# Patient Record
Sex: Female | Born: 1945
Health system: Southern US, Community
[De-identification: ages and names within clinical notes are randomized; demographics above are authoritative.]

## PROBLEM LIST (undated history)

## (undated) DIAGNOSIS — M1990 Unspecified osteoarthritis, unspecified site: Secondary | ICD-10-CM

## (undated) DIAGNOSIS — I2510 Atherosclerotic heart disease of native coronary artery without angina pectoris: Secondary | ICD-10-CM

## (undated) DIAGNOSIS — I10 Essential (primary) hypertension: Secondary | ICD-10-CM

## (undated) DIAGNOSIS — T8859XA Other complications of anesthesia, initial encounter: Secondary | ICD-10-CM

## (undated) DIAGNOSIS — E785 Hyperlipidemia, unspecified: Secondary | ICD-10-CM

## (undated) DIAGNOSIS — G4700 Insomnia, unspecified: Secondary | ICD-10-CM

## (undated) DIAGNOSIS — G4730 Sleep apnea, unspecified: Secondary | ICD-10-CM

## (undated) HISTORY — DX: Essential (primary) hypertension: I10

## (undated) HISTORY — DX: Hyperlipidemia, unspecified: E78.5

## (undated) HISTORY — PX: COLONOSCOPY: SHX174

## (undated) HISTORY — DX: Insomnia, unspecified: G47.00

## (undated) HISTORY — PX: CATARACT EXTRACTION, BILATERAL: SHX1313

---

## 1967-01-19 HISTORY — PX: TUBAL LIGATION: SHX77

## 1997-08-15 ENCOUNTER — Other Ambulatory Visit: Admission: RE | Admit: 1997-08-15 | Discharge: 1997-08-15 | Payer: Self-pay | Admitting: *Deleted

## 1997-08-20 ENCOUNTER — Ambulatory Visit (HOSPITAL_COMMUNITY): Admission: RE | Admit: 1997-08-20 | Discharge: 1997-08-20 | Payer: Self-pay | Admitting: *Deleted

## 1998-10-09 ENCOUNTER — Other Ambulatory Visit: Admission: RE | Admit: 1998-10-09 | Discharge: 1998-10-09 | Payer: Self-pay | Admitting: *Deleted

## 1998-10-21 ENCOUNTER — Encounter: Payer: Self-pay | Admitting: *Deleted

## 1998-10-21 ENCOUNTER — Ambulatory Visit (HOSPITAL_COMMUNITY): Admission: RE | Admit: 1998-10-21 | Discharge: 1998-10-21 | Payer: Self-pay | Admitting: *Deleted

## 2001-02-28 ENCOUNTER — Ambulatory Visit (HOSPITAL_COMMUNITY): Admission: RE | Admit: 2001-02-28 | Discharge: 2001-02-28 | Payer: Self-pay | Admitting: Obstetrics and Gynecology

## 2001-02-28 ENCOUNTER — Encounter: Payer: Self-pay | Admitting: Obstetrics and Gynecology

## 2001-11-22 ENCOUNTER — Ambulatory Visit (HOSPITAL_COMMUNITY): Admission: RE | Admit: 2001-11-22 | Discharge: 2001-11-22 | Payer: Self-pay | Admitting: Gastroenterology

## 2002-08-29 ENCOUNTER — Ambulatory Visit (HOSPITAL_COMMUNITY): Admission: RE | Admit: 2002-08-29 | Discharge: 2002-08-29 | Payer: Self-pay | Admitting: Obstetrics and Gynecology

## 2002-08-29 ENCOUNTER — Encounter: Payer: Self-pay | Admitting: Obstetrics and Gynecology

## 2003-08-23 ENCOUNTER — Emergency Department (HOSPITAL_COMMUNITY): Admission: EM | Admit: 2003-08-23 | Discharge: 2003-08-23 | Payer: Self-pay | Admitting: Family Medicine

## 2003-10-08 ENCOUNTER — Ambulatory Visit (HOSPITAL_COMMUNITY): Admission: RE | Admit: 2003-10-08 | Discharge: 2003-10-08 | Payer: Self-pay | Admitting: Obstetrics and Gynecology

## 2003-12-04 ENCOUNTER — Encounter: Admission: RE | Admit: 2003-12-04 | Discharge: 2003-12-04 | Payer: Self-pay | Admitting: Internal Medicine

## 2004-10-20 ENCOUNTER — Ambulatory Visit (HOSPITAL_COMMUNITY): Admission: RE | Admit: 2004-10-20 | Discharge: 2004-10-20 | Payer: Self-pay | Admitting: Obstetrics and Gynecology

## 2005-10-26 ENCOUNTER — Ambulatory Visit (HOSPITAL_COMMUNITY): Admission: RE | Admit: 2005-10-26 | Discharge: 2005-10-26 | Payer: Self-pay | Admitting: Obstetrics and Gynecology

## 2008-11-19 DIAGNOSIS — E785 Hyperlipidemia, unspecified: Secondary | ICD-10-CM | POA: Insufficient documentation

## 2008-12-03 ENCOUNTER — Ambulatory Visit (HOSPITAL_COMMUNITY): Admission: RE | Admit: 2008-12-03 | Discharge: 2008-12-03 | Payer: Self-pay | Admitting: Internal Medicine

## 2009-12-02 DIAGNOSIS — Z2839 Other underimmunization status: Secondary | ICD-10-CM | POA: Insufficient documentation

## 2009-12-02 DIAGNOSIS — M810 Age-related osteoporosis without current pathological fracture: Secondary | ICD-10-CM | POA: Insufficient documentation

## 2009-12-19 ENCOUNTER — Ambulatory Visit (HOSPITAL_COMMUNITY)
Admission: RE | Admit: 2009-12-19 | Discharge: 2009-12-19 | Payer: Self-pay | Source: Home / Self Care | Admitting: Internal Medicine

## 2010-06-05 NOTE — Op Note (Signed)
   NAMEVIVIKA, Helen Washington NO.:  [id number]   MEDICAL RECORD NO.:  [id number]                   PATIENT TYPE:  AMB   LOCATION:  ENDO                                 FACILITY:  [facility]   PHYSICIAN:  Petra Kuba, M.D.                 DATE OF BIRTH:  October 22, 1945   DATE OF PROCEDURE:  11/22/2001  DATE OF DISCHARGE:                                 OPERATIVE REPORT   PROCEDURE:  Colonoscopy.   INDICATIONS:  Family history of colon cancer.  Consent was signed after  risks, benefits, methods, and options thoroughly discussed in the office.   MEDICATIONS:  Demerol 50 mg, Versed 4 mg.   DESCRIPTION OF PROCEDURE:  Rectal inspection was pertinent for external  hemorrhoids, small.  Digital exam was negative.  The video pediatric  adjustable colonoscope was inserted, easily advanced around the colon to the  cecum.  This did require some abdominal pressure but no position changes.  No obvious abnormality was seen on insertion.  The cecum was identified by  the appendiceal orifice and the ileocecal valve.  In fact, the scope was  inserted a short way into the terminal ileum, which was normal.  Photo  documentation was obtained.  The scope was slowly withdrawn.  On slow  withdrawal through the colon, a rare right and left diverticula were seen  but no polypoid lesions, masses, or other abnormalities as we slowly  withdrew back to the rectum.  Once back in the rectum, the scope was  retroflexed, pertinent for some internal hemorrhoids.  The scope was  straightened and readvanced a short way up the left side of the colon, air  was suctioned, and the scope removed.  The patient tolerated the procedure  well.  There was no obvious immediate complication.   ENDOSCOPIC DIAGNOSES:  1 . Internal-external hemorrhoids.  1. Occasional left and right diverticula.  2. Otherwise within normal limits to the terminal ileum.   PLAN:  Happy to see back p.r.n.  Otherwise, return care  to Dr. Felipa Eth for the  customary health care maintenance to include yearly rectals and guaiacs, and  otherwise probably repeat screening in five years.                                               Petra Kuba, M.D.    MEM/MEDQ  D:  11/22/2001  T:  11/23/2001  Job:  [id number]   cc:   Larina Earthly, M.D.  [address]  [place]  [place] [address]  Fax: [phone]

## 2011-01-07 ENCOUNTER — Other Ambulatory Visit (HOSPITAL_COMMUNITY): Payer: Self-pay | Admitting: Internal Medicine

## 2011-01-07 DIAGNOSIS — Z1231 Encounter for screening mammogram for malignant neoplasm of breast: Secondary | ICD-10-CM

## 2011-02-01 ENCOUNTER — Ambulatory Visit (HOSPITAL_COMMUNITY)
Admission: RE | Admit: 2011-02-01 | Discharge: 2011-02-01 | Disposition: A | Discharge status: Home / Self Care | Payer: Medicare Other | Source: Ambulatory Visit | Attending: Internal Medicine | Admitting: Internal Medicine

## 2011-02-01 DIAGNOSIS — Z1231 Encounter for screening mammogram for malignant neoplasm of breast: Secondary | ICD-10-CM

## 2012-03-06 ENCOUNTER — Other Ambulatory Visit (HOSPITAL_COMMUNITY): Payer: Self-pay | Admitting: Internal Medicine

## 2012-03-06 DIAGNOSIS — Z1231 Encounter for screening mammogram for malignant neoplasm of breast: Secondary | ICD-10-CM

## 2012-03-21 ENCOUNTER — Ambulatory Visit (HOSPITAL_COMMUNITY): Payer: Medicare Other

## 2012-03-30 ENCOUNTER — Ambulatory Visit (HOSPITAL_COMMUNITY)
Admission: RE | Admit: 2012-03-30 | Discharge: 2012-03-30 | Disposition: A | Discharge status: Home / Self Care | Payer: Medicare Other | Source: Ambulatory Visit | Attending: Internal Medicine | Admitting: Internal Medicine

## 2012-03-30 DIAGNOSIS — Z1231 Encounter for screening mammogram for malignant neoplasm of breast: Secondary | ICD-10-CM | POA: Insufficient documentation

## 2012-12-26 DIAGNOSIS — K5900 Constipation, unspecified: Secondary | ICD-10-CM | POA: Insufficient documentation

## 2013-09-26 ENCOUNTER — Other Ambulatory Visit (HOSPITAL_COMMUNITY): Payer: Self-pay | Admitting: Internal Medicine

## 2013-09-26 DIAGNOSIS — Z1231 Encounter for screening mammogram for malignant neoplasm of breast: Secondary | ICD-10-CM

## 2013-09-27 ENCOUNTER — Ambulatory Visit (HOSPITAL_COMMUNITY)
Admission: RE | Admit: 2013-09-27 | Discharge: 2013-09-27 | Disposition: A | Discharge status: Home / Self Care | Payer: Medicare HMO | Source: Ambulatory Visit | Attending: Internal Medicine | Admitting: Internal Medicine

## 2013-09-27 DIAGNOSIS — Z1231 Encounter for screening mammogram for malignant neoplasm of breast: Secondary | ICD-10-CM | POA: Diagnosis present

## 2014-10-14 ENCOUNTER — Other Ambulatory Visit (HOSPITAL_BASED_OUTPATIENT_CLINIC_OR_DEPARTMENT_OTHER): Payer: Self-pay

## 2014-10-14 ENCOUNTER — Other Ambulatory Visit (HOSPITAL_COMMUNITY): Payer: Self-pay | Admitting: *Deleted

## 2014-10-14 DIAGNOSIS — Z1231 Encounter for screening mammogram for malignant neoplasm of breast: Secondary | ICD-10-CM

## 2014-10-18 ENCOUNTER — Ambulatory Visit (HOSPITAL_COMMUNITY)
Admission: RE | Admit: 2014-10-18 | Discharge: 2014-10-18 | Disposition: A | Discharge status: Home / Self Care | Payer: Medicare HMO | Source: Ambulatory Visit | Attending: Internal Medicine | Admitting: Internal Medicine

## 2014-10-18 DIAGNOSIS — Z1231 Encounter for screening mammogram for malignant neoplasm of breast: Secondary | ICD-10-CM | POA: Diagnosis not present

## 2014-10-22 DIAGNOSIS — H2513 Age-related nuclear cataract, bilateral: Secondary | ICD-10-CM | POA: Diagnosis not present

## 2014-10-22 DIAGNOSIS — H40013 Open angle with borderline findings, low risk, bilateral: Secondary | ICD-10-CM | POA: Diagnosis not present

## 2015-01-30 DIAGNOSIS — R69 Illness, unspecified: Secondary | ICD-10-CM | POA: Diagnosis not present

## 2015-02-04 DIAGNOSIS — Z0000 Encounter for general adult medical examination without abnormal findings: Secondary | ICD-10-CM | POA: Diagnosis not present

## 2015-02-04 DIAGNOSIS — M810 Age-related osteoporosis without current pathological fracture: Secondary | ICD-10-CM | POA: Diagnosis not present

## 2015-02-04 DIAGNOSIS — E785 Hyperlipidemia, unspecified: Secondary | ICD-10-CM | POA: Diagnosis not present

## 2015-02-04 DIAGNOSIS — E559 Vitamin D deficiency, unspecified: Secondary | ICD-10-CM | POA: Diagnosis not present

## 2015-02-11 DIAGNOSIS — N390 Urinary tract infection, site not specified: Secondary | ICD-10-CM | POA: Insufficient documentation

## 2015-02-11 DIAGNOSIS — E559 Vitamin D deficiency, unspecified: Secondary | ICD-10-CM | POA: Diagnosis not present

## 2015-02-11 DIAGNOSIS — M810 Age-related osteoporosis without current pathological fracture: Secondary | ICD-10-CM | POA: Diagnosis not present

## 2015-02-11 DIAGNOSIS — G4709 Other insomnia: Secondary | ICD-10-CM | POA: Diagnosis not present

## 2015-02-11 DIAGNOSIS — Z23 Encounter for immunization: Secondary | ICD-10-CM | POA: Diagnosis not present

## 2015-02-11 DIAGNOSIS — Z1389 Encounter for screening for other disorder: Secondary | ICD-10-CM | POA: Diagnosis not present

## 2015-02-11 DIAGNOSIS — E784 Other hyperlipidemia: Secondary | ICD-10-CM | POA: Diagnosis not present

## 2015-02-11 DIAGNOSIS — Z0000 Encounter for general adult medical examination without abnormal findings: Secondary | ICD-10-CM | POA: Diagnosis not present

## 2015-02-11 DIAGNOSIS — D239 Other benign neoplasm of skin, unspecified: Secondary | ICD-10-CM | POA: Diagnosis not present

## 2015-02-11 DIAGNOSIS — Z0131 Encounter for examination of blood pressure with abnormal findings: Secondary | ICD-10-CM | POA: Diagnosis not present

## 2015-03-25 DIAGNOSIS — Z0131 Encounter for examination of blood pressure with abnormal findings: Secondary | ICD-10-CM | POA: Diagnosis not present

## 2015-03-25 DIAGNOSIS — R0789 Other chest pain: Secondary | ICD-10-CM | POA: Diagnosis not present

## 2015-03-25 DIAGNOSIS — Z6823 Body mass index (BMI) 23.0-23.9, adult: Secondary | ICD-10-CM | POA: Diagnosis not present

## 2015-04-22 DIAGNOSIS — H2513 Age-related nuclear cataract, bilateral: Secondary | ICD-10-CM | POA: Diagnosis not present

## 2015-05-07 DIAGNOSIS — M810 Age-related osteoporosis without current pathological fracture: Secondary | ICD-10-CM | POA: Diagnosis not present

## 2015-10-09 ENCOUNTER — Other Ambulatory Visit: Payer: Self-pay | Admitting: Internal Medicine

## 2015-10-09 DIAGNOSIS — Z1231 Encounter for screening mammogram for malignant neoplasm of breast: Secondary | ICD-10-CM

## 2015-10-21 ENCOUNTER — Ambulatory Visit
Admission: RE | Admit: 2015-10-21 | Discharge: 2015-10-21 | Disposition: A | Discharge status: Home / Self Care | Payer: Medicare HMO | Source: Ambulatory Visit | Attending: Internal Medicine | Admitting: Internal Medicine

## 2015-10-21 DIAGNOSIS — Z1231 Encounter for screening mammogram for malignant neoplasm of breast: Secondary | ICD-10-CM | POA: Diagnosis not present

## 2015-10-22 DIAGNOSIS — H2513 Age-related nuclear cataract, bilateral: Secondary | ICD-10-CM | POA: Diagnosis not present

## 2015-12-10 DIAGNOSIS — H401311 Pigmentary glaucoma, right eye, mild stage: Secondary | ICD-10-CM | POA: Diagnosis not present

## 2015-12-23 DIAGNOSIS — H25812 Combined forms of age-related cataract, left eye: Secondary | ICD-10-CM | POA: Diagnosis not present

## 2015-12-23 DIAGNOSIS — H25012 Cortical age-related cataract, left eye: Secondary | ICD-10-CM | POA: Diagnosis not present

## 2015-12-23 DIAGNOSIS — H2512 Age-related nuclear cataract, left eye: Secondary | ICD-10-CM | POA: Diagnosis not present

## 2016-01-20 DIAGNOSIS — H2511 Age-related nuclear cataract, right eye: Secondary | ICD-10-CM | POA: Diagnosis not present

## 2016-01-20 DIAGNOSIS — H25011 Cortical age-related cataract, right eye: Secondary | ICD-10-CM | POA: Diagnosis not present

## 2016-01-20 DIAGNOSIS — H25811 Combined forms of age-related cataract, right eye: Secondary | ICD-10-CM | POA: Diagnosis not present

## 2016-01-27 DIAGNOSIS — R69 Illness, unspecified: Secondary | ICD-10-CM | POA: Diagnosis not present

## 2016-02-10 DIAGNOSIS — M810 Age-related osteoporosis without current pathological fracture: Secondary | ICD-10-CM | POA: Diagnosis not present

## 2016-02-10 DIAGNOSIS — E784 Other hyperlipidemia: Secondary | ICD-10-CM | POA: Diagnosis not present

## 2016-02-10 DIAGNOSIS — R8299 Other abnormal findings in urine: Secondary | ICD-10-CM | POA: Diagnosis not present

## 2016-02-17 DIAGNOSIS — Z0000 Encounter for general adult medical examination without abnormal findings: Secondary | ICD-10-CM | POA: Diagnosis not present

## 2016-02-17 DIAGNOSIS — Z23 Encounter for immunization: Secondary | ICD-10-CM | POA: Diagnosis not present

## 2016-02-17 DIAGNOSIS — Z0131 Encounter for examination of blood pressure with abnormal findings: Secondary | ICD-10-CM | POA: Diagnosis not present

## 2016-02-17 DIAGNOSIS — R0789 Other chest pain: Secondary | ICD-10-CM | POA: Diagnosis not present

## 2016-02-17 DIAGNOSIS — E784 Other hyperlipidemia: Secondary | ICD-10-CM | POA: Diagnosis not present

## 2016-02-17 DIAGNOSIS — Z6822 Body mass index (BMI) 22.0-22.9, adult: Secondary | ICD-10-CM | POA: Diagnosis not present

## 2016-02-17 DIAGNOSIS — M810 Age-related osteoporosis without current pathological fracture: Secondary | ICD-10-CM | POA: Diagnosis not present

## 2016-02-17 DIAGNOSIS — G4700 Insomnia, unspecified: Secondary | ICD-10-CM | POA: Diagnosis not present

## 2016-02-17 DIAGNOSIS — K5900 Constipation, unspecified: Secondary | ICD-10-CM | POA: Diagnosis not present

## 2016-02-17 DIAGNOSIS — Z1389 Encounter for screening for other disorder: Secondary | ICD-10-CM | POA: Diagnosis not present

## 2016-03-02 DIAGNOSIS — M810 Age-related osteoporosis without current pathological fracture: Secondary | ICD-10-CM | POA: Diagnosis not present

## 2016-03-04 DIAGNOSIS — R030 Elevated blood-pressure reading, without diagnosis of hypertension: Secondary | ICD-10-CM | POA: Insufficient documentation

## 2016-03-04 DIAGNOSIS — Z6823 Body mass index (BMI) 23.0-23.9, adult: Secondary | ICD-10-CM | POA: Diagnosis not present

## 2016-03-09 DIAGNOSIS — Z1212 Encounter for screening for malignant neoplasm of rectum: Secondary | ICD-10-CM | POA: Diagnosis not present

## 2016-03-12 DIAGNOSIS — R030 Elevated blood-pressure reading, without diagnosis of hypertension: Secondary | ICD-10-CM | POA: Diagnosis not present

## 2016-04-01 ENCOUNTER — Encounter: Payer: Self-pay | Admitting: Neurology

## 2016-04-01 ENCOUNTER — Ambulatory Visit (INDEPENDENT_AMBULATORY_CARE_PROVIDER_SITE_OTHER): Payer: Medicare HMO | Admitting: Neurology

## 2016-04-01 VITALS — BP 141/68 | HR 78 | Resp 20 | Ht 60.0 in | Wt 119.0 lb

## 2016-04-01 DIAGNOSIS — R0683 Snoring: Secondary | ICD-10-CM | POA: Insufficient documentation

## 2016-04-01 DIAGNOSIS — R69 Illness, unspecified: Secondary | ICD-10-CM | POA: Diagnosis not present

## 2016-04-01 DIAGNOSIS — F5102 Adjustment insomnia: Secondary | ICD-10-CM | POA: Diagnosis not present

## 2016-04-01 DIAGNOSIS — I10 Essential (primary) hypertension: Secondary | ICD-10-CM | POA: Insufficient documentation

## 2016-04-01 NOTE — Progress Notes (Signed)
SLEEP MEDICINE CLINIC   Provider:  Larey Seat, M D  Referring Provider: Prince Solian, MD Primary Care Physician:  Tivis Ringer, MD  Chief Complaint  Patient presents with  . Insomnia    rm 11, New Pt, referral for sleep study    HPI:  Helen Washington is a 71 y.o. female , seen here as a referral  from Dr. Dagmar Hait for A sleep consultation.  Helen Washington is a 71 year old Caucasian female patient presents today after she has had multiple high blood pressure readings some at night and some in daytime without further explanation. Dr. Dagmar Hait would  like to see if sleep apnea may be a cause.  The patient reports that she sometimes experiences sleeplessness, tossing and turning not being able to enter sleep. But this is an exception.   Chief complaint according to patient : "I have no problems sleeping".     Sleep habits are as follows: She goes to her bedroom between 9 and 10 PM but she watches TV from there on until she feels ready to go to sleep. She set the TV on a timer of 60 minutes duration, and is usually asleep before that time is over. Most of the time she will stay asleep. He does not have nocturia , has no need to go to the bathroom at night. She usually wakes up at 4 AM spontaneously and she again will watch TV at that time until 6 AM when she gets up. After arising at 6 AM she has breakfast at home including coffee, and 3 mornings a week goes to the [facility] for sports. She does 2 hours of water aerobics and the treadmill for 20 minutes. And she may swim for another half hour. She rarely experiences shortness of breath not doing exercises, she usually does not experience palpitations or diaphoresis at night, she does not wake up with headaches, not with a dry mouth. Once in a while  She has trouble sleeping and she may just take a 30 minute nap the following day.     Sleep medical history and family sleep history:  Social history: The patient is a former smoker but quit in 2001  which means she hasn't smoked in over 17 years. She had 30 pack years, she drinks only 1 caffeinated beverage a day, she does not use alcohol, she is very active ( retired) and she swims and walks. [employer], after 40 years. Past medical history is positive for hyperlipidemia, periodic insomnia, a tubal ligation in 1969, cataract surgery in December 2017 and in January of this year, and a family history of cancer in her father and kidney stones, her mother lift through her 22th birthday, suffered a stroke, had hypertension and arthritis. The patient also has 3 brothers one died of natural causes in advanced age one of her brothers committed suicide. One sister is 22 living with hypertension her daughter is alive and well at 74 she has 2 grandchildren.   Review of Systems: Out of a complete 14 system review, the patient complains of only the following symptoms, and all other reviewed systems are negative.  Elevated blood pressures the patient was issued a 24-hour blood pressure cuff but could hardly sleep with a device on, she was instructed on low sodium diet, She has not had symptoms of high blood pressure.  Epworth score 8, Fatigue severity score 12  , depression score not depressed.   Social History   Social History  . Marital  status: Divorced    Spouse name: N/A  . Number of children: 1  . Years of education: 30   Occupational History  .      retired Scientist, clinical (histocompatibility and immunogenetics), Sport and exercise psychologist   Social History Main Topics  . Smoking status: Former Smoker    Packs/day: 1.00    Years: 36.00    Types: Cigarettes    Quit date: 04/01/2000  . Smokeless tobacco: Never Used  . Alcohol use No  . Drug use: No  . Sexual activity: Not on file   Other Topics Concern  . Not on file   Social History Narrative   Lives with room mate   Caffeine - coffee, 1 cup daily    Family History  Problem Relation Age of Onset  . Stroke Mother   . Cancer Father     Past Medical History:    Diagnosis Date  . Hyperlipidemia   . Hypertension   . Insomnia     Past Surgical History:  Procedure Laterality Date  . CATARACT EXTRACTION, BILATERAL    . TUBAL LIGATION      Current Outpatient Prescriptions  Medication Sig Dispense Refill  . alendronate (FOSAMAX) 70 MG tablet Take 70 mg by mouth once a week. Take with a full glass of water on an empty stomach.    [name] aspirin EC 81 MG tablet Take 81 mg by mouth daily.    . Aspirin-Caffeine 845-65 MG PACK Take by mouth.    . Vitamin D, Cholecalciferol, 400 units TABS Take by mouth.     No current facility-administered medications for this visit.     Allergies as of 04/01/2016 - Review Complete 04/01/2016  Allergen Reaction Noted  . Codeine Nausea Only 04/01/2016    Vitals: BP (!) 141/68   Pulse 78   Resp 20   Ht 5' (1.524 m)   Wt 119 lb (54 kg)   BMI 23.24 kg/m  Last Weight:  Wt Readings from Last 1 Encounters:  04/01/16 119 lb (54 kg)   VOJ:JKKX mass index is 23.24 kg/m.     Last Height:   Ht Readings from Last 1 Encounters:  04/01/16 5' (1.524 m)    Physical exam:  General: The patient is awake, alert and appears not in acute distress. The patient is well groomed. Head: Normocephalic, atraumatic. Neck is supple. Mallampati 1  neck circumference: 13.5  . Nasal airflow partially obstructed.,  Prognathia. Upper dentition with implants.  Cardiovascular:  Regular rate and rhythm , without  murmurs or carotid bruit, and without distended neck veins. Respiratory: Lungs are clear to auscultation. Skin:  Without evidence of edema, or rash Trunk: BMI is 23. The patient's posture is erect   Neurologic exam : The patient is awake and alert, oriented to place and time.   Mood and affect are appropriate.  Cranial nerves: Pupils are equal and briskly reactive to light.  Extraocular movements  in vertical and horizontal planes intact and without nystagmus. Visual fields by finger perimetry are intact. Hearing to finger  rub intact.   Facial sensation intact to fine touch.  Facial motor strength is symmetric and tongue and uvula move midline. Shoulder shrug was symmetrical.   Motor exam: Normal tone, muscle bulk and symmetric strength in all extremities. Sensory:  Fine touch, pinprick and vibration /Proprioception tested in the upper extremities was normal. Coordination: Rapid alternating movements /. Finger-to-nose maneuver  normal without evidence of ataxia, dysmetria or tremor. Gait and station: Patient walks without assistive device and is able  unassisted to climb up to the exam table. Strength within normal limits.Stance is stable and normal.  Deep tendon reflexes: in the  upper and lower extremities are symmetric and intact.   Assessment:  After physical and neurologic examination, review of laboratory studies,  Personal review of imaging studies, reports of other /same  Imaging studies, neurophysiology testing and pre-existing records as far as provided in visit, my assessment is   1) Mrs. Enyeart presents in excellent health, with a normal body mass index, wide open upper airway, and small neck circumference. She is also concerned about the recently more frequently measured blood pressure values and understands that sleep apnea if left untreated may be a cause of high blood pressure at night.   She was told she snores by her sister, but she has never been told that she stops breathing at night. I think it would be good to evaluate the patient for sleep apnea, and to see if hypoxemia or hypercapnia is present, if apnea is present at all or if she is just a snorer and if this may be positional.   I will order a polysomnography with [facility] and follow up with the patient after the study is interpreted.   The patient was advised of the nature of the diagnosed sleep disorder , the treatment options and risks for general a health and wellness arising from not treating the condition.  I spent  more than 40 minutes of face to face time with the patient. Greater than 50% of time was spent in counseling and coordination of care. We have discussed the diagnosis and differential and I answered the patient's questions.     Asencion Partridge Omari Koslosky MD  04/01/2016   CC: Prince Solian, [place], [place] [address]

## 2016-04-07 DIAGNOSIS — Z6824 Body mass index (BMI) 24.0-24.9, adult: Secondary | ICD-10-CM | POA: Diagnosis not present

## 2016-04-07 DIAGNOSIS — M810 Age-related osteoporosis without current pathological fracture: Secondary | ICD-10-CM | POA: Diagnosis not present

## 2016-04-07 DIAGNOSIS — Z7982 Long term (current) use of aspirin: Secondary | ICD-10-CM | POA: Diagnosis not present

## 2016-04-07 DIAGNOSIS — Z0000 Encounter for general adult medical examination without abnormal findings: Secondary | ICD-10-CM | POA: Diagnosis not present

## 2016-04-07 DIAGNOSIS — Z87891 Personal history of nicotine dependence: Secondary | ICD-10-CM | POA: Diagnosis not present

## 2016-04-07 DIAGNOSIS — Z7983 Long term (current) use of bisphosphonates: Secondary | ICD-10-CM | POA: Diagnosis not present

## 2016-05-05 ENCOUNTER — Ambulatory Visit (INDEPENDENT_AMBULATORY_CARE_PROVIDER_SITE_OTHER): Payer: Medicare HMO | Admitting: Neurology

## 2016-05-05 DIAGNOSIS — G4730 Sleep apnea, unspecified: Secondary | ICD-10-CM | POA: Diagnosis not present

## 2016-05-05 DIAGNOSIS — R0683 Snoring: Secondary | ICD-10-CM

## 2016-05-05 DIAGNOSIS — I10 Essential (primary) hypertension: Secondary | ICD-10-CM

## 2016-05-05 DIAGNOSIS — G4733 Obstructive sleep apnea (adult) (pediatric): Secondary | ICD-10-CM

## 2016-05-17 NOTE — Addendum Note (Signed)
Addended by: Larey Seat on: 05/17/2016 11:19 AM   Modules accepted: Orders

## 2016-05-17 NOTE — Procedures (Signed)
PATIENT'S NAME:  Helen Washington, Helen Washington DOB:      April 16, 1945      MR#:    [id number]     DATE OF RECORDING: 05/05/2016 REFERRING M.D.:  Prince Solian, MD Study Performed:   Baseline Polysomnogram HISTORY:  71 year old Caucasian female patient presents with new  high blood pressure. Dr. Dagmar Hait would  like to see if sleep apnea may be a cause. The patient reports that she sometimes experiences sleeplessness, tossing and turning and  not being able to enter sleep. She's been told she snores.  The patient endorsed the Epworth Sleepiness Scale at 8/24 points.   The patient's weight 119 pounds with a height of 60 (inches), resulting in a BMI of 23.4 kg/m2. The patient's neck circumference measured 13.5 inches.  CURRENT MEDICATIONS: Fosamax; Vitamin D   PROCEDURE:  This is a multichannel digital polysomnogram utilizing the Somnostar 11.2 system.  Electrodes and sensors were applied and monitored per AASM Specifications.   EEG, EOG, Chin and Limb EMG, were sampled at 200 Hz.  ECG, Snore and Nasal Pressure, Thermal Airflow, Respiratory Effort, CPAP Flow and Pressure, Oximetry was sampled at 50 Hz. Digital video and audio were recorded.      BASELINE STUDY  Lights Out was at 21:13 and Lights On at 05:00.  Total recording time (TRT) was 468 minutes, with a total sleep time (TST) of  431.5 minutes.   The patient's sleep latency was 33 minutes.  REM latency was 44.5 minutes.  The sleep efficiency was 92.2 %.     SLEEP ARCHITECTURE: WASO (Wake after sleep onset) was 10 minutes.  There were 5.5 minutes in Stage N1, 223 minutes Stage N2, 125 minutes Stage N3 and 78 minutes in Stage REM.  The percentage of Stage N1 was 1.3%, Stage N2 was 51.7%, Stage N3 was 29.% and Stage R (REM sleep) was 18.1%.   RESPIRATORY ANALYSIS:  There were a total of 96 respiratory events:  31 obstructive apneas, 1 central apneas and 30 mixed apneas with a total of 62 apneas and an apnea index (AI) of 8.6 /hour. There were 34 hypopneas with a  hypopnea index of 4.7 /hour. The patient also had 0 respiratory event related arousals (RERAs).     The total APNEA/HYPOPNEA INDEX (AHI) was 13.3/hour and the total RESPIRATORY DISTURBANCE INDEX was 13.3 /hour.  51 events occurred in REM sleep and 54 events in NREM. The REM AHI was 39.2 /hour, versus a non-REM AHI of 7.6. The patient spent 70.5 minutes of total sleep time in the supine position and 361 minutes in non-supine.. The supine AHI was 5.2 versus a non-supine AHI of 15.0.  OXYGEN SATURATION & C02:  The Wake baseline 02 saturation was 95%, with the lowest being 75%. Time spent below 89% saturation equaled 202 minutes.  PERIODIC LIMB MOVEMENTS:  The patient had a total of 0 Periodic Limb Movements The arousals were noted as: 50 were spontaneous, 0 were associated with PLMs, and 39 were associated with respiratory events.  Audio and video analysis did not show any abnormal or unusual movements, behaviors, phonations or vocalizations.    The patient did not take a bathroom break. Snoring was noted. EKG was in keeping with normal sinus rhythm (NSR).  Post-study, the patient indicated that sleep was worse than usual ( sleep efficiency was 92.2 %) .    IMPRESSION:  1. Mild Obstructive Sleep Apnea(OSA) at AHI of 13.3 /hr  with strong accentuation during REM sleep to  39.2/hr.  2. No Periodic  Limb Movement Disorder (PLMD) 3. Primary Snoring 4. High sleep efficiency , normal sleep architecture-  contrary to sleep perception.   RECOMMENDATIONS:  1. Advise full-night, attended, CPAP titration study to optimize therapy.   2. Avoid sedative-hypnotics which may worsen sleep apnea, alcohol and tobacco (as applicable). 3. Advise patient to avoid driving or operating hazardous machinery when sleepy. 4. ENT examination/ procedures and dental devices tend not to work for REM dependent apnea as well as for  snoring as  clinical concern. 5. Further information regarding OSA may be obtained from [facility] (www.sleepfoundation.org) or American Sleep Apnea Association (www.sleepapnea.org). 6. Consider dedicated sleep psychology referral if sleep perception/ pseudo insomnia is of clinical concern.   7. A follow up appointment will be scheduled in the Sleep Clinic at [facility]. The referring provider will be notified of the results.      I certify that I have reviewed the entire raw data recording prior to the issuance of this report in accordance with the Standards of Accreditation of the American Academy of Sleep Medicine (AASM)      Larey Seat, MD     May 17 2016 Diplomat, Tax adviser of Psychiatry and Neurology  Diplomat, Tax adviser of Sleep Medicine Market researcher, [employer] [employer]

## 2016-05-20 ENCOUNTER — Telehealth: Payer: Self-pay

## 2016-05-20 NOTE — Telephone Encounter (Signed)
-----   Message from Larey Seat, MD sent at 05/17/2016 11:19 AM EDT ----- 1. Mild Obstructive Sleep Apnea(OSA) at AHI of 13.3 /hr  with strong accentuation during REM sleep to  39.2/hr.  2. No Periodic Limb Movement Disorder (PLMD) 3. Primary Snoring 4. High sleep efficiency, normal sleep architecture-  contrary to sleep perception.   Return for CPAP titration in lab. CD

## 2016-05-20 NOTE — Telephone Encounter (Signed)
I called Helen Washington. I advised Helen Washington that Dr. Brett Fairy reviewed their sleep study results and found that Helen Washington has mild osa with strong accentuation during REM sleep. Dr. Brett Fairy recommends that Helen Washington return for a repeat sleep study in order to properly titrate the cpap and ensure a good mask fit.  Helen Washington does not want to come back for a cpap titration study because she feels good during the day. I explained the risks of untreated apnea. Helen Washington would like to schedule an appt with Dr. Brett Fairy to discuss. An appt was made for 05/27/16 at 3:30pm. Helen Washington verbalized understanding of results. Helen Washington had no questions at this time but was encouraged to call back if questions arise.

## 2016-05-27 ENCOUNTER — Encounter: Payer: Self-pay | Admitting: Neurology

## 2016-05-27 ENCOUNTER — Ambulatory Visit (INDEPENDENT_AMBULATORY_CARE_PROVIDER_SITE_OTHER): Payer: Medicare HMO | Admitting: Neurology

## 2016-05-27 VITALS — BP 135/78 | HR 90 | Resp 20 | Ht 59.0 in | Wt 120.0 lb

## 2016-05-27 DIAGNOSIS — R0902 Hypoxemia: Secondary | ICD-10-CM

## 2016-05-27 DIAGNOSIS — R0683 Snoring: Secondary | ICD-10-CM | POA: Diagnosis not present

## 2016-05-27 DIAGNOSIS — G4733 Obstructive sleep apnea (adult) (pediatric): Secondary | ICD-10-CM

## 2016-05-27 NOTE — Progress Notes (Signed)
SLEEP MEDICINE CLINIC   Provider:  Larey Seat, M D  Referring Provider: Prince Solian, MD Primary Care Physician:  Helen Solian, MD  Chief Complaint  Patient presents with  . Follow-up    pt is worried about a mask on her face at night    HPI:   Interval history from 05/27/2016, I have the pleasure of seeing Helen Washington today. The patient underwent a baseline polysomnography on 05/05/2016 and was diagnosed with mild sleep apnea all obstructive in origin at a frequency of 13.3 per hour of sleep. During REM sleep however her apnea exacerbated to 39.2, her oxygen nadir was low at only 75% saturation and the total desaturation time was very long at 202 minutes for a sleep study that only and calm past 430 minutes. Was for the constellation of findings that CPAP therapy was recommended. The patient has no history of a pulmonary disorder, never suffered pulmonary emboli, does not have COPD or asthma, is not on medication that would suppress her breathing drive. She reports a normal degree of sleepiness not excessive at 7 points on the Epworth score she's not fatigued in daytime at only 12 points of the fatigue severity scale and she certainly not depressed endorsing 0 points on a 15 point depression questionnaire.  She is concerned about the mask associated with CPAP use. It is her impression that she will get a FFM- I took it upon myself to fit her with a Respironics pediatric size nasal mask, and an AirFit P 10 for which I only had S size inserts , not XS. She is concerned about having to logg it to [place] in July on a 10 day trip- I explained she can take a break.     Helen Washington is a 71 y.o. female , seen here as a referral  from Dr. Dagmar Washington for a sleep consultation. [name] is a 71 year old Caucasian female patient presents today after she has had multiple high blood pressure readings some at night and some in daytime without further explanation. Dr. Dagmar Washington would  like to see if sleep  apnea may be a cause.  The patient reports that she sometimes experiences sleeplessness, tossing and turning not being able to enter sleep. But this is an exception.   Chief complaint according to patient : "I have no problems sleeping".    Sleep habits are as follows: She goes to her bedroom between 9 and 10 PM but she watches TV from there on until she feels ready to go to sleep. She set the TV on a timer of 60 minutes duration, and is usually asleep before that time is over. Most of the time she will stay asleep. He does not have nocturia , has no need to go to the bathroom at night. She usually wakes up at 4 AM spontaneously and she again will watch TV at that time until 6 AM when she gets up. After arising at 6 AM she has breakfast at home including coffee, and 3 mornings a week goes to the [facility] for sports. She does 2 hours of water aerobics and the treadmill for 20 minutes. And she may swim for another half hour. She rarely experiences shortness of breath not doing exercises, she usually does not experience palpitations or diaphoresis at night, she does not wake up with headaches, not with a dry mouth. Once in a while  She has trouble sleeping and she may just take a 30 minute nap the following day.  Sleep medical history and family sleep history:  Social history: The patient is a former smoker but quit in 2001 which means she hasn't smoked in over 17 years. She had 30 pack years, she drinks only 1 caffeinated beverage a day, she does not use alcohol, she is very active ( retired) and she swims and walks. [employer], after 40 years. Past medical history is positive for hyperlipidemia, periodic insomnia, a tubal ligation in 1969, cataract surgery in December 2017 and in January of this year, and a family history of cancer in her father and kidney stones, her mother lift through her 43th birthday, suffered a stroke, had hypertension and arthritis. The patient also has 3  brothers one died of natural causes in advanced age one of her brothers committed suicide. One sister is 22 living with hypertension her daughter is alive and well at 25 she has 2 grandchildren.   Review of Systems: Out of a complete 14 system review, the patient complains of only the following symptoms, and all other reviewed systems are negative.  Elevated blood pressures the patient was issued a 24-hour blood pressure cuff but could hardly sleep with a device on, she was instructed on low sodium diet, She has not had symptoms of high blood pressure.  Epworth score 8, Fatigue severity score 12  , depression score not depressed.   Social History   Social History  . Marital status: Divorced    Spouse name: N/A  . Number of children: 1  . Years of education: 33   Occupational History  .      retired Scientist, clinical (histocompatibility and immunogenetics), Sport and exercise psychologist   Social History Main Topics  . Smoking status: Former Smoker    Packs/day: 1.00    Years: 36.00    Types: Cigarettes    Quit date: 04/01/2000  . Smokeless tobacco: Never Used  . Alcohol use No  . Drug use: No  . Sexual activity: Not on file   Other Topics Concern  . Not on file   Social History Narrative   Lives with room mate   Caffeine - coffee, 1 cup daily    Family History  Problem Relation Age of Onset  . Stroke Mother   . Cancer Father     Past Medical History:  Diagnosis Date  . Hyperlipidemia   . Hypertension   . Insomnia     Past Surgical History:  Procedure Laterality Date  . CATARACT EXTRACTION, BILATERAL    . TUBAL LIGATION      Current Outpatient Prescriptions  Medication Sig Dispense Refill  . alendronate (FOSAMAX) 70 MG tablet Take 70 mg by mouth once a week. Take with a full glass of water on an empty stomach.    [name] aspirin EC 81 MG tablet Take 81 mg by mouth daily.    . Aspirin-Caffeine 845-65 MG PACK Take by mouth.    . Vitamin D, Cholecalciferol, 400 units TABS Take by mouth.     No current facility-administered medications  for this visit.     Allergies as of 05/27/2016 - Review Complete 05/27/2016  Allergen Reaction Noted  . Codeine Nausea Only 04/01/2016    Vitals: BP 135/78   Pulse 90   Resp 20   Ht 4\' 11"  (1.499 m)   Wt 120 lb (54.4 kg)   BMI 24.24 kg/m  Last Weight:  Wt Readings from Last 1 Encounters:  05/27/16 120 lb (54.4 kg)   WHQ:PRFF mass index is 24.24 kg/m.  Last Height:   Ht Readings from Last 1 Encounters:  05/27/16 4\' 11"  (1.499 m)    Physical exam:  General: The patient is awake, alert and appears not in acute distress. The patient is well groomed. Head: Normocephalic, atraumatic. Neck is supple. Mallampati 1  neck circumference: 13.5  . Nasal airflow partially obstructed.,  Prognathia. Upper dentition with implants.  Cardiovascular:  Regular rate and rhythm , without  murmurs or carotid bruit, and without distended neck veins. Respiratory: Lungs are clear to auscultation. Skin:  Without evidence of edema, or rash Trunk: BMI is 23. The patient's posture is erect   Neurologic exam : The patient is awake and alert, oriented to place and time.   Mood and affect are appropriate.  Cranial nerves: Pupils are equal and briskly reactive to light.  Extraocular movements  in vertical and horizontal planes intact and without nystagmus. Visual fields by finger perimetry are intact. Hearing to finger rub intact.   Facial sensation intact to fine touch.  Facial motor , tongue and uvula move midline. Shoulder shrug was symmetrical.   Assessment:  After physical and neurologic examination, review of laboratory studies,  Personal review of imaging studies, reports of other /same  Imaging studies, neurophysiology testing and pre-existing records as far as provided in visit, my assessment is   1) Helen Washington presents in excellent health, with a normal body mass index, wide open upper airway, and small neck circumference. She is also concerned about the recently more frequently measured  blood pressure values and understands that sleep apnea if left untreated may be a cause of high blood pressure at night.   She was diagnosed with REM dependent OSA, and has been exquisitely sensitive to anything  Touching her face, concerned she couldn't stand CPAP interface. We had a 20 minute fitting session and she is much more tolerant of the CPAP idea and especially the peds interface. She was told she snores by her sister, and she would like to not snore and annoy her room mate on the upcoming trip to [place] .   The patient was advised of the nature of the diagnosed sleep disorder , the treatment options and risks for general a health and wellness arising from not treating the condition.  I spent more than 25 minutes of face to face time with the patient.  Greater than 50% of time was spent in counseling and coordination of care. We have discussed the diagnosis and differential and I answered the patient's questions.   Shirlean Mylar , please advise tech to use Respironics pediatric nasal mask or an  XS airfit p 10.    Asencion Partridge Gola Bribiesca MD  05/27/2016   CC: Helen Washington, [address], [place] [address]

## 2016-05-27 NOTE — Patient Instructions (Signed)

## 2016-06-10 ENCOUNTER — Ambulatory Visit (INDEPENDENT_AMBULATORY_CARE_PROVIDER_SITE_OTHER): Payer: Medicare HMO | Admitting: Neurology

## 2016-06-10 DIAGNOSIS — R0683 Snoring: Secondary | ICD-10-CM

## 2016-06-10 DIAGNOSIS — R0902 Hypoxemia: Secondary | ICD-10-CM

## 2016-06-10 DIAGNOSIS — G4733 Obstructive sleep apnea (adult) (pediatric): Secondary | ICD-10-CM

## 2016-06-17 DIAGNOSIS — R69 Illness, unspecified: Secondary | ICD-10-CM | POA: Diagnosis not present

## 2016-06-22 ENCOUNTER — Telehealth: Payer: Self-pay

## 2016-06-22 NOTE — Addendum Note (Signed)
Addended by: Larey Seat on: 06/22/2016 09:58 AM   Modules accepted: Orders

## 2016-06-22 NOTE — Telephone Encounter (Signed)
-----   Message from Larey Seat, MD sent at 06/22/2016  9:58 AM EDT ----- 1. The physician should recommend or reinforce behavioral treatment for better sleep which should include: CPAP compliance at 4 hours or more of nightly use.  a. Weight loss if appropriate. b. Avoidance of medications with muscle relaxant properties. c. Avoidance of ingestion of alcohol prior to sleeping. d. Avoiding sleeping in the supine position (on one's back). e. Improvement of nasal patency if indicated. f. Avoidance of driving if or when sleepy.

## 2016-06-22 NOTE — Procedures (Signed)
PATIENT'S NAME:  Helen Washington, Helen Washington DOB:      05-16-1945      MR#:    [id number]     DATE OF RECORDING: 06/10/2016 REFERRING M.D.:  Prince Solian, MD Study Performed:   Titration to positive airway pressure (CPAP)  HISTORY: patient with new onset malignant hypertension. Helen Washington returned for CPAP titration following her PSG (performed on 05/05/2016), which resulted in an AHI of 13.3/hr. REM AHI of 39.2 and nadir SpO2 of 75%. Total desaturation time was 202 minutes. The patient endorsed the Epworth Sleepiness Scale at 8 points and the Fatigue Score at 32 points.   The patient's weight 119 pounds with a height of 60 (inches), resulting in a BMI of 23.4 kg/m2. The patient's neck circumference measured 13.5 inches.  CURRENT MEDICATIONS: Fosamax; Vitamin D  PROCEDURE:  This is a multichannel digital polysomnogram utilizing the SomnoStar 11.2 system.  Electrodes and sensors were applied and monitored per AASM Specifications.   EEG, EOG, Chin and Limb EMG, were sampled at 200 Hz.  ECG, Snore and Nasal Pressure, Thermal Airflow, Respiratory Effort, CPAP Flow and Pressure, Oximetry was sampled at 50 Hz. Digital video and audio were recorded.      CPAP was initiated at 5 cmH20 with heated humidity per AASM split night standards and pressure was advanced to 6 cmH20 because of hypopneas, apneas and desaturations.  At a PAP pressure of 6 cmH20, there was a reduction of the AHI to 0 .2 with improvement of obstructive sleep apnea.   Lights Out was at 21:18 and Lights On at 05:01. Total recording time (TRT) was 464 minutes, with a total sleep time (TST) of 395 minutes. The patient's sleep latency was 8.5 minutes with 0.5 minutes of wake time after sleep onset. REM latency was 39 minutes. The sleep efficiency was 85.1 %.    SLEEP ARCHITECTURE: WASO (Wake after sleep onset) was 61 minutes.  There were 25.5 minutes in Stage N1, 94.5 minutes Stage N2, 159 minutes Stage N3 and 116 minutes in Stage REM.  The percentage of  Stage N1 was 6.5%, Stage N2 was 23.9%, Stage N3 was 40.3% and Stage R (REM sleep) was 29.4%.   RESPIRATORY ANALYSIS:  There was a total of 1 respiratory event: 1 mixed apneas with 0 hypopneas .The total APNEA/HYPOPNEA INDEX  (AHI) was 0.2 /hour and the total RESPIRATORY DISTURBANCE INDEX was .2 .hour  1 events occurred in REM sleep and 0 events in NREM. The REM AHI was .5 /hour versus a non-REM AHI of 0 /hour.  The patient spent 156 minutes of total sleep time in the supine position and 239 minutes in non-supine. The supine AHI was 0.4, versus a non-supine AHI of 0.0.  OXYGEN SATURATION & C02:  The baseline 02 saturation was 96%, with the lowest being 82%. Time spent below 89% saturation equaled 13 minutes.  PERIODIC LIMB MOVEMENTS:   The patient had a total of 0 Periodic Limb Movements The arousals were noted as: 57 were spontaneous, 0 were associated with PLMs, 0 were associated with respiratory events. Audio and video analysis did not show any abnormal or unusual movements, behaviors, phonations or vocalizations.   Snoring was alleviated. EKG was in keeping with normal sinus rhythm (NSR).  Post-study, the patient indicated that sleep was better than usual.   DIAGNOSIS . Obstructive Sleep Apnea, REM dependent and associated with prolonged hypoxemia; responding well to low pressure CPAP at 9 cm water and heated humidity. The patient was fitted with a [employer]  Fit P10 pillows xs apparatus.  PLANS/RECOMMENDATIONS: 1. The primary care physician should recommend or reinforce behavioral treatment for better sleep which should include: CPAP compliance at 4 hours or more of nightly use.  a. Weight loss if appropriate. b. Avoidance of medications with muscle relaxant properties. c. Avoidance of ingestion of alcohol prior to sleeping. d. Avoiding sleeping in the supine position (on one's back). e. Improvement of nasal patency if indicated. f. Avoidance of driving if or when sleepy.  DISCUSSION: A  follow up appointment will be scheduled in the Sleep Clinic at [facility].   Please call [phone] with any questions.      I certify that I have reviewed the entire raw data recording prior to the issuance of this report in accordance with the Standards of Accreditation of the American Academy of Sleep Medicine (AASM)    Larey Seat, M.D.  06-22-2016  Diplomat, American Board of Psychiatry and Neurology  Diplomat, [place] [facility] Medical Director, [place] [facility]

## 2016-06-22 NOTE — Telephone Encounter (Signed)
I called pt. I advised pt that Dr. Brett Fairy reviewed their sleep study results and found that pt did well with the cpap. Dr. Brett Fairy recommends that pt start a cpap at home. I reviewed PAP compliance expectations with the pt. Pt is agreeable to starting a CPAP. I advised pt that an order will be sent to a DME, [facility], and [facility] will call the pt within about one week after they file with the pt's insurance. [facility] will show the pt how to use the machine, fit for masks, and troubleshoot the CPAP if needed. A follow up appt was made for insurance purposes with Dr. Brett Fairy on Thursday, September 20th, 2018 at 9:30am. Pt verbalized understanding to arrive 15 minutes early and bring their CPAP. A letter with all of this information in it will be mailed to the pt as a reminder. I verified with the pt that the address we have on file is correct. Pt was advised to not drive or operate hazardous machinery when sleepy. Pt was advised to avoid sleeping on her back and to avoid muscle relaxants, as well ingestion of alcohol prior to sleeping. Pt verbalized understanding of results. Pt had no questions at this time but was encouraged to call back if questions arise.

## 2016-08-24 DIAGNOSIS — G4733 Obstructive sleep apnea (adult) (pediatric): Secondary | ICD-10-CM | POA: Diagnosis not present

## 2016-08-25 DIAGNOSIS — H1013 Acute atopic conjunctivitis, bilateral: Secondary | ICD-10-CM | POA: Diagnosis not present

## 2016-09-14 DIAGNOSIS — Z961 Presence of intraocular lens: Secondary | ICD-10-CM | POA: Diagnosis not present

## 2016-09-15 ENCOUNTER — Telehealth: Payer: Self-pay | Admitting: Neurology

## 2016-09-15 NOTE — Telephone Encounter (Signed)
Pt calling re: her initial CPAP appointment, she states she started using it on 08-04. She will be out of town on 9-20 but as of 9-24 will be available should RN Myriam Jacobson be able to schedule her within the required time re: her CPAP usage. Pt agreed to Jan 2019 appointment with understanding that a message would be sent to try and get her in asap.  Please call

## 2016-09-16 NOTE — Telephone Encounter (Signed)
Called patient and spoke with her and explained that she would need to be seen by Sept to qualify for insurance purposes. I was able to schedule her in for a Sept apt with Cecille Rubin NP.

## 2016-09-24 DIAGNOSIS — G4733 Obstructive sleep apnea (adult) (pediatric): Secondary | ICD-10-CM | POA: Diagnosis not present

## 2016-09-28 DIAGNOSIS — R69 Illness, unspecified: Secondary | ICD-10-CM | POA: Diagnosis not present

## 2016-10-07 ENCOUNTER — Ambulatory Visit: Payer: Self-pay | Admitting: Neurology

## 2016-10-12 NOTE — Progress Notes (Signed)
[facility]  PATIENT: Helen Washington DOB: 31-Aug-1945   REASON FOR VISIT: Follow-up for obstructive sleep apnea with initial CPAP compliance HISTORY FROM: Patient    HISTORY OF PRESENT ILLNESS:UPDATE 09/26/2018CM [name], 71 year old female returns for follow-up for initial CPAP compliance report. She has a history of sleep apnea. Compliance report dated 09/13/2016 through 10/12/2016 shows compliance 90% percent 27 days greater than 4 hours. Average usage 6 hours 37 minutes. Set pressure 9 cm. EPR 3. AHI 1.4 ESS 4. She reports much less daytime drowsiness. She returns for reevaluation  CDInterval history from 05/27/2016, I have the pleasure of seeing Mrs. Helen Washington today. The patient underwent a baseline polysomnography on 05/05/2016 and was diagnosed with mild sleep apnea all obstructive in origin at a frequency of 13.3 per hour of sleep. During REM sleep however her apnea exacerbated to 39.2, her oxygen nadir was low at only 75% saturation and the total desaturation time was very long at 202 minutes for a sleep study that only and calm past 430 minutes. Was for the constellation of findings that CPAP therapy was recommended. The patient has no history of a pulmonary disorder, never suffered pulmonary emboli, does not have COPD or asthma, is not on medication that would suppress her breathing drive. She reports a normal degree of sleepiness not excessive at 7 points on the Epworth score she's not fatigued in daytime at only 12 points of the fatigue severity scale and she certainly not depressed endorsing 0 points on a 15 point depression questionnaire.  She is concerned about the mask associated with CPAP use. It is her impression that she will get a FFM- I took it upon myself to fit her with a Respironics pediatric size nasal mask, and an AirFit P 10 for which I only had S size inserts , not XS. She is concerned about having to logg it to [place] in July on a 10 day trip- I explained  she can take a break.    REVIEW OF SYSTEMS: Full 14 system review of systems performed and notable only for those listed, all others are neg:  Constitutional: neg  Cardiovascular: neg Ear/Nose/Throat: neg  Skin: neg Eyes: neg Respiratory: neg Gastroitestinal: neg  Hematology/Lymphatic: neg  Endocrine: neg Musculoskeletal:neg Allergy/Immunology: neg Neurological: neg Psychiatric: neg Sleep : Obstructive sleep apnea with CPAP   ALLERGIES: Allergies  Allergen Reactions  . Codeine Nausea Only    HOME MEDICATIONS: Outpatient Medications Prior to Visit  Medication Sig Dispense Refill  . alendronate (FOSAMAX) 70 MG tablet Take 70 mg by mouth once a week. Take with a full glass of water on an empty stomach.    [name] aspirin EC 81 MG tablet Take 81 mg by mouth daily.    . Aspirin-Caffeine 845-65 MG PACK Take by mouth.    . Vitamin D, Cholecalciferol, 400 units TABS Take by mouth.     No facility-administered medications prior to visit.     PAST MEDICAL HISTORY: Past Medical History:  Diagnosis Date  . Hyperlipidemia   . Hypertension   . Insomnia     PAST SURGICAL HISTORY: Past Surgical History:  Procedure Laterality Date  . CATARACT EXTRACTION, BILATERAL    . TUBAL LIGATION      FAMILY HISTORY: Family History  Problem Relation Age of Onset  . Stroke Mother   . Cancer Father     SOCIAL HISTORY: Social History   Social History  . Marital status: Divorced    Spouse name: N/A  . Number  of children: 1  . Years of education: 52   Occupational History  .      retired Scientist, clinical (histocompatibility and immunogenetics), Sport and exercise psychologist   Social History Main Topics  . Smoking status: Former Smoker    Packs/day: 1.00    Years: 36.00    Types: Cigarettes    Quit date: 04/01/2000  . Smokeless tobacco: Never Used  . Alcohol use No  . Drug use: No  . Sexual activity: Not on file   Other Topics Concern  . Not on file   Social History Narrative   Lives with room mate   Caffeine - coffee, 1 cup daily      PHYSICAL EXAM  Vitals:   10/13/16 1038  BP: 118/68  Pulse: 69  Weight: 118 lb (53.5 kg)   Body mass index is 23.83 kg/m.  Generalized: Well developed, in no acute distress  Head: normocephalic and atraumatic,. Oropharynx benign  Neck: Supple, Musculoskeletal: No deformity   Neurological examination   Mentation: Alert oriented to time, place, history taking. Attention span and concentration appropriate. Recent and remote memory intact.  Follows all commands speech and language fluent. ESS 4  Cranial nerve II-XII: Pupils were equal round reactive to light extraocular movements were full, visual field were full on confrontational test. Facial sensation and strength were normal. hearing was intact to finger rubbing bilaterally. Uvula tongue midline. head turning and shoulder shrug were normal and symmetric.Tongue protrusion into cheek strength was normal. Motor: normal bulk and tone, full strength in the BUE, BLE,  Sensory: normal and symmetric to light touch, on the face arms and legs Coordination: finger-nose-finger, heel-to-shin bilaterally, no dysmetria Reflexes: Symmetric upper and lower plantar responses were flexor bilaterally. Gait and Station: Rising up from seated position without assistance, normal stance,  moderate stride, good arm swing, smooth turning, able to perform tiptoe, and heel walking without difficulty. Tandem gait is steady  DIAGNOSTIC DATA (LABS, IMAGING, TESTING) - I reviewed patient records, labs, notes, testing and imaging myself where available.   ASSESSMENT AND PLAN  71 y.o. year old female  with obstructive sleep apnea here for initial CPAP compliance data.Compliance report dated 09/13/2016 through 10/12/2016 shows compliance 90% percent 27 days greater than 4 hours. Average usage 6 hours 37 minutes. Set pressure 9 cm. EPR 3. AHI 1.4   PLAN: CPAP compliance good ESS 4 Patient advised not to operate hazardous machinery or drive when  sleepy Patient advised to avoid sleeping on her back and to avoid muscle relaxants and indigestion of alcohol prior to sleeping Follow-up in 6 months I spent 15 minutes in total face to face time with the patient more than 50% of which was spent counseling and coordination of care, reviewing test results reviewing medications and discussing and reviewing the diagnosis of obstructive sleep apnea and CPAP compliance Dennie Bible, [facility], APRN  [facility] [address], [place], [place] [address] [phone]

## 2016-10-13 ENCOUNTER — Ambulatory Visit (INDEPENDENT_AMBULATORY_CARE_PROVIDER_SITE_OTHER): Payer: Medicare HMO | Admitting: Nurse Practitioner

## 2016-10-13 ENCOUNTER — Encounter: Payer: Self-pay | Admitting: Nurse Practitioner

## 2016-10-13 DIAGNOSIS — Z9989 Dependence on other enabling machines and devices: Secondary | ICD-10-CM

## 2016-10-13 DIAGNOSIS — G4733 Obstructive sleep apnea (adult) (pediatric): Secondary | ICD-10-CM | POA: Diagnosis not present

## 2016-10-13 NOTE — Progress Notes (Signed)
I agree with the assessment and plan as directed by NP .The patient is known to me .   Lonzie Simmer, MD  

## 2016-10-13 NOTE — Patient Instructions (Signed)
CPAP compliance good ESS 4 Follow-up in 6 months

## 2016-10-21 ENCOUNTER — Other Ambulatory Visit: Payer: Self-pay | Admitting: Internal Medicine

## 2016-10-21 DIAGNOSIS — Z1231 Encounter for screening mammogram for malignant neoplasm of breast: Secondary | ICD-10-CM

## 2016-10-24 DIAGNOSIS — G4733 Obstructive sleep apnea (adult) (pediatric): Secondary | ICD-10-CM | POA: Diagnosis not present

## 2016-11-02 ENCOUNTER — Ambulatory Visit
Admission: RE | Admit: 2016-11-02 | Discharge: 2016-11-02 | Disposition: A | Discharge status: Home / Self Care | Payer: Medicare HMO | Source: Ambulatory Visit | Attending: Internal Medicine | Admitting: Internal Medicine

## 2016-11-02 DIAGNOSIS — Z1231 Encounter for screening mammogram for malignant neoplasm of breast: Secondary | ICD-10-CM | POA: Diagnosis not present

## 2016-11-23 DIAGNOSIS — G4733 Obstructive sleep apnea (adult) (pediatric): Secondary | ICD-10-CM | POA: Diagnosis not present

## 2016-11-24 DIAGNOSIS — G4733 Obstructive sleep apnea (adult) (pediatric): Secondary | ICD-10-CM | POA: Diagnosis not present

## 2016-12-24 DIAGNOSIS — G4733 Obstructive sleep apnea (adult) (pediatric): Secondary | ICD-10-CM | POA: Diagnosis not present

## 2017-01-24 DIAGNOSIS — G4733 Obstructive sleep apnea (adult) (pediatric): Secondary | ICD-10-CM | POA: Diagnosis not present

## 2017-01-26 ENCOUNTER — Ambulatory Visit: Payer: Self-pay | Admitting: Neurology

## 2017-01-27 DIAGNOSIS — Z85828 Personal history of other malignant neoplasm of skin: Secondary | ICD-10-CM | POA: Diagnosis not present

## 2017-01-27 DIAGNOSIS — Z7983 Long term (current) use of bisphosphonates: Secondary | ICD-10-CM | POA: Diagnosis not present

## 2017-01-27 DIAGNOSIS — M810 Age-related osteoporosis without current pathological fracture: Secondary | ICD-10-CM | POA: Diagnosis not present

## 2017-01-27 DIAGNOSIS — Z7982 Long term (current) use of aspirin: Secondary | ICD-10-CM | POA: Diagnosis not present

## 2017-02-24 DIAGNOSIS — E559 Vitamin D deficiency, unspecified: Secondary | ICD-10-CM | POA: Diagnosis not present

## 2017-02-24 DIAGNOSIS — R82998 Other abnormal findings in urine: Secondary | ICD-10-CM | POA: Diagnosis not present

## 2017-02-24 DIAGNOSIS — E7849 Other hyperlipidemia: Secondary | ICD-10-CM | POA: Diagnosis not present

## 2017-02-24 DIAGNOSIS — G4733 Obstructive sleep apnea (adult) (pediatric): Secondary | ICD-10-CM | POA: Diagnosis not present

## 2017-03-03 DIAGNOSIS — E7849 Other hyperlipidemia: Secondary | ICD-10-CM | POA: Diagnosis not present

## 2017-03-03 DIAGNOSIS — I7389 Other specified peripheral vascular diseases: Secondary | ICD-10-CM | POA: Diagnosis not present

## 2017-03-03 DIAGNOSIS — K5909 Other constipation: Secondary | ICD-10-CM | POA: Diagnosis not present

## 2017-03-03 DIAGNOSIS — G4733 Obstructive sleep apnea (adult) (pediatric): Secondary | ICD-10-CM | POA: Diagnosis not present

## 2017-03-03 DIAGNOSIS — Z0000 Encounter for general adult medical examination without abnormal findings: Secondary | ICD-10-CM | POA: Diagnosis not present

## 2017-03-03 DIAGNOSIS — R030 Elevated blood-pressure reading, without diagnosis of hypertension: Secondary | ICD-10-CM | POA: Diagnosis not present

## 2017-03-03 DIAGNOSIS — M25532 Pain in left wrist: Secondary | ICD-10-CM | POA: Diagnosis not present

## 2017-03-03 DIAGNOSIS — G4709 Other insomnia: Secondary | ICD-10-CM | POA: Diagnosis not present

## 2017-03-03 DIAGNOSIS — Z23 Encounter for immunization: Secondary | ICD-10-CM | POA: Diagnosis not present

## 2017-03-03 DIAGNOSIS — M810 Age-related osteoporosis without current pathological fracture: Secondary | ICD-10-CM | POA: Diagnosis not present

## 2017-03-03 DIAGNOSIS — M25539 Pain in unspecified wrist: Secondary | ICD-10-CM | POA: Insufficient documentation

## 2017-03-07 ENCOUNTER — Other Ambulatory Visit (HOSPITAL_COMMUNITY): Payer: Self-pay | Admitting: Internal Medicine

## 2017-03-07 DIAGNOSIS — I739 Peripheral vascular disease, unspecified: Secondary | ICD-10-CM

## 2017-03-08 ENCOUNTER — Ambulatory Visit (HOSPITAL_COMMUNITY)
Admission: RE | Admit: 2017-03-08 | Discharge: 2017-03-08 | Disposition: A | Discharge status: Home / Self Care | Payer: Medicare HMO | Source: Ambulatory Visit | Attending: Vascular Surgery | Admitting: Vascular Surgery

## 2017-03-08 DIAGNOSIS — I739 Peripheral vascular disease, unspecified: Secondary | ICD-10-CM | POA: Diagnosis not present

## 2017-03-08 DIAGNOSIS — G4733 Obstructive sleep apnea (adult) (pediatric): Secondary | ICD-10-CM | POA: Diagnosis not present

## 2017-03-10 DIAGNOSIS — Z1212 Encounter for screening for malignant neoplasm of rectum: Secondary | ICD-10-CM | POA: Diagnosis not present

## 2017-03-24 DIAGNOSIS — G4733 Obstructive sleep apnea (adult) (pediatric): Secondary | ICD-10-CM | POA: Diagnosis not present

## 2017-03-25 ENCOUNTER — Encounter: Payer: Medicare HMO | Admitting: Vascular Surgery

## 2017-04-04 ENCOUNTER — Encounter: Payer: Self-pay | Admitting: Surgery

## 2017-04-04 ENCOUNTER — Ambulatory Visit: Payer: Medicare HMO | Admitting: Surgery

## 2017-04-04 VITALS — BP 141/85 | HR 73 | Resp 18 | Ht 59.0 in | Wt 122.0 lb

## 2017-04-04 DIAGNOSIS — I70213 Atherosclerosis of native arteries of extremities with intermittent claudication, bilateral legs: Secondary | ICD-10-CM

## 2017-04-04 MED ORDER — CILOSTAZOL 100 MG PO TABS: 100.0000 mg | ORAL_TABLET | Freq: Two times a day (BID) | ORAL | 11 refills | Status: DC

## 2017-04-04 NOTE — Progress Notes (Signed)
Vascular and Vein Specialist of [facility]  Patient name: Helen Washington MRN: [id number] DOB: 1945-09-23 Sex: female   REQUESTING PROVIDER:    Dr. Dagmar Hait   REASON FOR CONSULT:    Claudication  HISTORY OF PRESENT ILLNESS:   Helen Washington is a 72 y.o. female, who is referred today for further evaluation of claudication.  The patient states that previously she had been able to walk at least 3 blocks before she had trouble.  Now she is down to less than 1 block.  She states her right leg bothers her more than the left.  She gets cramping in her calf and the leg gives out.  Her typical daily exercise routine consists of walking for approximately 20 minutes on the treadmill followed by 15 minutes of swimming laps followed by 2 hours of water aerobics.  She denies rest pain or nonhealing wounds.  She does take a daily aspirin.  The patient suffers from white coat hypertension.  She has previously been on a statin however this was stopped several years ago and diet and exercise were initiated because her cholesterol profile was adequate.  Her most recent studies show an LDL of 131 with an HDL around 80.  The patient is a former smoker.  PAST MEDICAL HISTORY    Past Medical History:  Diagnosis Date  . Hyperlipidemia   . Hypertension   . Insomnia      FAMILY HISTORY   Family History  Problem Relation Age of Onset  . Stroke Mother   . Arthritis Mother   . CVA Mother   . Diverticulitis Mother   . Cancer Father   . Hypertension Sister   . Diverticulosis Sister   . Breast cancer Cousin   . Suicidality Brother   . Hypertension Brother   . Anxiety disorder Brother   . Kidney Stones Brother   . Ulcers Brother   . Healthy Daughter     SOCIAL HISTORY:   Social History   Socioeconomic History  . Marital status: Divorced    Spouse name: Not on file  . Number of children: 1  . Years of education: 33  . Highest education level: Not on file  Social  Needs  . Financial resource strain: Not on file  . Food insecurity - worry: Not on file  . Food insecurity - inability: Not on file  . Transportation needs - medical: Not on file  . Transportation needs - non-medical: Not on file  Occupational History    Comment: retired Scientist, clinical (histocompatibility and immunogenetics), typist  Tobacco Use  . Smoking status: Former Smoker    Packs/day: 1.00    Years: 36.00    Pack years: 36.00    Types: Cigarettes    Last attempt to quit: 04/01/2000    Years since quitting: 17.0  . Smokeless tobacco: Never Used  Substance and Sexual Activity  . Alcohol use: No  . Drug use: No  . Sexual activity: Not on file  Other Topics Concern  . Not on file  Social History Narrative   Lives with room mate   Caffeine - coffee, 1 cup daily    ALLERGIES:    Allergies  Allergen Reactions  . Codeine Nausea Only    CURRENT MEDICATIONS:    Current Outpatient Medications  Medication Sig Dispense Refill  . alendronate (FOSAMAX) 70 MG tablet Take 70 mg by mouth once a week. Take with a full glass of water on an empty stomach.    [name] aspirin EC 81 MG tablet  Take 81 mg by mouth daily.    . Aspirin-Caffeine 845-65 MG PACK Take by mouth.    . Vitamin D, Cholecalciferol, 400 units TABS Take by mouth.    . cilostazol (PLETAL) 100 MG tablet Take 1 tablet (100 mg total) by mouth 2 (two) times daily before a meal. 60 tablet 11   No current facility-administered medications for this visit.     REVIEW OF SYSTEMS:   [X]  denotes positive finding, [ ]  denotes negative finding Cardiac  Comments:  Chest pain or chest pressure:    Shortness of breath upon exertion:    Short of breath when lying flat:    Irregular heart rhythm:        Vascular    Pain in calf, thigh, or hip brought on by ambulation: x   Pain in feet at night that wakes you up from your sleep:     Blood clot in your veins:    Leg swelling:         Pulmonary    Oxygen at home:    Productive cough:     Wheezing:         Neurologic      Sudden weakness in arms or legs:  x   Sudden numbness in arms or legs:     Sudden onset of difficulty speaking or slurred speech:    Temporary loss of vision in one eye:     Problems with dizziness:         Gastrointestinal    Blood in stool:      Vomited blood:         Genitourinary    Burning when urinating:     Blood in urine:        Psychiatric    Major depression:         Hematologic    Bleeding problems:    Problems with blood clotting too easily:        Skin    Rashes or ulcers:        Constitutional    Fever or chills:     PHYSICAL EXAM:   Vitals:   04/04/17 1520  BP: (!) 141/85  Pulse: 73  Resp: 18  SpO2: 95%  Weight: 122 lb (55.3 kg)  Height: 4\' 11"  (1.499 m)    GENERAL: The patient is a well-nourished female, in no acute distress. The vital signs are documented above. CARDIAC: There is a regular rate and rhythm.  VASCULAR: Nonpalpable pedal pulses.  1+ pitting edema to the left leg PULMONARY: Nonlabored respirations MUSCULOSKELETAL: There are no major deformities or cyanosis. NEUROLOGIC: No focal weakness or paresthesias are detected. SKIN: There are no ulcers or rashes noted. PSYCHIATRIC: The patient has a normal affect.  STUDIES:   I have ordered and reviewed her ABIs.  The right is 0.54.  The left is 0.75. Right toe pressure is 63. Left toe pressure is 97  ASSESSMENT and PLAN   Claudication: I discussed the importance of medical management.  We discussed exercise programs and how to modify her existing program to improve her symptoms.  We also discussed the importance of healthy eating.  I do think she would benefit from starting a statin for hypercholesterolemia, given her peripheral vascular disease.  I have recommended that she begin a trial of cilostazol to see if this helps her walking distance.  She is going to follow-up with me in 3 months.  If she is not able to tolerate her symptoms, the  next step would be angiography.   [name], MD Vascular and Vein Specialists of [facility] [phone] Pager [phone]

## 2017-04-17 ENCOUNTER — Encounter: Payer: Self-pay | Admitting: Nurse Practitioner

## 2017-04-18 NOTE — Progress Notes (Signed)
[facility]  PATIENT: Helen Washington DOB: 04-27-1945   REASON FOR VISIT: Follow-up for obstructive sleep apnea with  CPAP compliance HISTORY FROM: Patient    HISTORY OF PRESENT ILLNESS:UPDATE 4/2/2019CM [name], 72 year old female returns for follow-up with history of obstructive sleep apnea here for CPAP compliance.  She is doing well and is having no problems with the machine.  She has 2 days which she did not use her CPAP because of being out of town.  Compliance data dated 03/19/2017 04/17/2017 shows compliance for 4 hours 87%.  Average usage 6hours set pressure 9 cm EPR level 3 AHI 1.  ESS 7.  She returns for reevaluation  UPDATE 09/26/2018CM [name], 72 year old female returns for follow-up for initial CPAP compliance report. She has a history of sleep apnea. Compliance report dated 09/13/2016 through 10/12/2016 shows compliance 90% percent 27 days greater than 4 hours. Average usage 6 hours 37 minutes. Set pressure 9 cm. EPR 3. AHI 1.4 ESS 4. She reports much less daytime drowsiness. She returns for reevaluation  CDInterval history from 05/27/2016, I have the pleasure of seeing Helen Washington today. The patient underwent a baseline polysomnography on 05/05/2016 and was diagnosed with mild sleep apnea all obstructive in origin at a frequency of 13.3 per hour of sleep. During REM sleep however her apnea exacerbated to 39.2, her oxygen nadir was low at only 75% saturation and the total desaturation time was very long at 202 minutes for a sleep study that only and calm past 430 minutes. Was for the constellation of findings that CPAP therapy was recommended. The patient has no history of a pulmonary disorder, never suffered pulmonary emboli, does not have COPD or asthma, is not on medication that would suppress her breathing drive. She reports a normal degree of sleepiness not excessive at 7 points on the Epworth score she's not fatigued in daytime at only 12 points of the fatigue  severity scale and she certainly not depressed endorsing 0 points on a 15 point depression questionnaire.  She is concerned about the mask associated with CPAP use. It is her impression that she will get a FFM- I took it upon myself to fit her with a Respironics pediatric size nasal mask, and an AirFit P 10 for which I only had S size inserts , not XS. She is concerned about having to logg it to [place] in July on a 10 day trip- I explained she can take a break.    REVIEW OF SYSTEMS: Full 14 system review of systems performed and notable only for those listed, all others are neg:  Constitutional: neg  Cardiovascular: neg Ear/Nose/Throat: Hearing loss Skin: neg Eyes: neg Respiratory: neg Gastroitestinal: neg  Hematology/Lymphatic: neg  Endocrine: neg Musculoskeletal: Joint pain Allergy/Immunology: neg Neurological: neg Psychiatric: neg Sleep : Obstructive sleep apnea with CPAP   ALLERGIES: Allergies  Allergen Reactions  . Codeine Nausea Only    HOME MEDICATIONS: Outpatient Medications Prior to Visit  Medication Sig Dispense Refill  . alendronate (FOSAMAX) 70 MG tablet Take 70 mg by mouth once a week. Take with a full glass of water on an empty stomach.    . Aspirin-Caffeine 845-65 MG PACK Take by mouth.    . cilostazol (PLETAL) 100 MG tablet Take 1 tablet (100 mg total) by mouth 2 (two) times daily before a meal. 60 tablet 11  . Vitamin D, Cholecalciferol, 400 units TABS Take by mouth.    [name] aspirin EC 81 MG tablet Take 81 mg by  mouth daily.     No facility-administered medications prior to visit.     PAST MEDICAL HISTORY: Past Medical History:  Diagnosis Date  . Hyperlipidemia   . Hypertension   . Insomnia     PAST SURGICAL HISTORY: Past Surgical History:  Procedure Laterality Date  . CATARACT EXTRACTION, BILATERAL  12/2015;01/2016   [facility] [place] [facility]  . TUBAL LIGATION  1969    FAMILY HISTORY: Family History  Problem Relation Age of Onset  .  Stroke Mother   . Arthritis Mother   . CVA Mother   . Diverticulitis Mother   . Cancer Father   . Hypertension Sister   . Diverticulosis Sister   . Breast cancer Cousin   . Suicidality Brother   . Hypertension Brother   . Anxiety disorder Brother   . Kidney Stones Brother   . Ulcers Brother   . Healthy Daughter     SOCIAL HISTORY: Social History   Socioeconomic History  . Marital status: Divorced    Spouse name: Not on file  . Number of children: 1  . Years of education: 42  . Highest education level: Not on file  Occupational History    Comment: retired Scientist, clinical (histocompatibility and immunogenetics), Sport and exercise psychologist  Social Needs  . Financial resource strain: Not on file  . Food insecurity:    Worry: Not on file    Inability: Not on file  . Transportation needs:    Medical: Not on file    Non-medical: Not on file  Tobacco Use  . Smoking status: Former Smoker    Packs/day: 1.00    Years: 36.00    Pack years: 36.00    Types: Cigarettes    Last attempt to quit: 04/01/2000    Years since quitting: 17.0  . Smokeless tobacco: Never Used  Substance and Sexual Activity  . Alcohol use: No  . Drug use: No  . Sexual activity: Not on file  Lifestyle  . Physical activity:    Days per week: Not on file    Minutes per session: Not on file  . Stress: Not on file  Relationships  . Social connections:    Talks on phone: Not on file    Gets together: Not on file    Attends religious service: Not on file    Active member of club or organization: Not on file    Attends meetings of clubs or organizations: Not on file    Relationship status: Not on file  . Intimate partner violence:    Fear of current or ex partner: Not on file    Emotionally abused: Not on file    Physically abused: Not on file    Forced sexual activity: Not on file  Other Topics Concern  . Not on file  Social History Narrative   Lives with room mate   Caffeine - coffee, 1 cup daily     PHYSICAL EXAM  Vitals:   04/19/17 0732  BP: 116/67    Pulse: 95  Weight: 123 lb 6.4 oz (56 kg)  Height: 4\' 11"  (1.499 m)   Body mass index is 24.92 kg/m.  Generalized: Well developed, in no acute distress  Head: normocephalic and atraumatic,. Oropharynx benign Mallopatti 1 Neck: Supple, circumference 14 Musculoskeletal: No deformity   Neurological examination   Mentation: Alert oriented to time, place, history taking. Attention span and concentration appropriate. Recent and remote memory intact.  Follows all commands speech and language fluent. ESS 7  Cranial nerve II-XII: Pupils were equal  round reactive to light extraocular movements were full, visual field were full on confrontational test. Facial sensation and strength were normal. hearing was intact to finger rubbing bilaterally. Uvula tongue midline. head turning and shoulder shrug were normal and symmetric.Tongue protrusion into cheek strength was normal. Motor: normal bulk and tone, full strength in the BUE, BLE,  Sensory: normal and symmetric to light touch, on the face arms and legs Coordination: finger-nose-finger, heel-to-shin bilaterally, no dysmetria Gait and Station: Rising up from seated position without assistance, normal stance,  moderate stride, good arm swing, smooth turning, able to perform tiptoe, and heel walking without difficulty. Tandem gait is steady  DIAGNOSTIC DATA (LABS, IMAGING, TESTING) - I reviewed patient records, labs, notes, testing and imaging myself where available.   ASSESSMENT AND PLAN  72 y.o. year old female  with obstructive sleep apnea here for  CPAP compliance data.Compliance data dated 03/19/2017 04/17/2017 shows compliance for 4 hours 87%.  Average usage 6hours set pressure 9 cm EPR level 3 AHI 1.  ESS 7.  S  PLAN: CPAP compliance good at 87% Continue same settings Follow-up yearly and as needed Patient advised not to operate hazardous machinery or drive when sleepy Patient advised to avoid sleeping on her back and to avoid muscle relaxants  and indigestion of alcohol prior to sleeping Follow-up yearly I spent 15 minutes in total face to face time with the patient more than 50% of which was spent counseling and coordination of care, reviewing test results reviewing medications and discussing and reviewing the diagnosis of obstructive sleep apnea and CPAP compliance Dennie Bible, [facility], [facility], APRN  [facility] [address], [place], [place] [address] [phone]

## 2017-04-19 ENCOUNTER — Encounter: Payer: Self-pay | Admitting: Nurse Practitioner

## 2017-04-19 ENCOUNTER — Ambulatory Visit: Payer: Medicare HMO | Admitting: Nurse Practitioner

## 2017-04-19 VITALS — BP 116/67 | HR 95 | Ht 59.0 in | Wt 123.4 lb

## 2017-04-19 DIAGNOSIS — G4733 Obstructive sleep apnea (adult) (pediatric): Secondary | ICD-10-CM | POA: Diagnosis not present

## 2017-04-19 DIAGNOSIS — Z9989 Dependence on other enabling machines and devices: Secondary | ICD-10-CM

## 2017-04-19 NOTE — Patient Instructions (Signed)
CPAP compliance good at 87% Continue same settings Follow-up yearly and as needed

## 2017-04-24 DIAGNOSIS — G4733 Obstructive sleep apnea (adult) (pediatric): Secondary | ICD-10-CM | POA: Diagnosis not present

## 2017-05-24 DIAGNOSIS — G4733 Obstructive sleep apnea (adult) (pediatric): Secondary | ICD-10-CM | POA: Diagnosis not present

## 2017-05-31 DIAGNOSIS — R69 Illness, unspecified: Secondary | ICD-10-CM | POA: Diagnosis not present

## 2017-07-11 ENCOUNTER — Encounter: Payer: Self-pay | Admitting: Surgery

## 2017-07-11 ENCOUNTER — Ambulatory Visit (INDEPENDENT_AMBULATORY_CARE_PROVIDER_SITE_OTHER): Payer: Medicare HMO | Admitting: Surgery

## 2017-07-11 VITALS — BP 103/61 | HR 111 | Temp 97.7°F | Ht 59.0 in | Wt 121.0 lb

## 2017-07-11 DIAGNOSIS — I70213 Atherosclerosis of native arteries of extremities with intermittent claudication, bilateral legs: Secondary | ICD-10-CM | POA: Diagnosis not present

## 2017-07-11 NOTE — Progress Notes (Signed)
Vascular and Vein Specialist of [facility]  Patient name: Helen Washington MRN: [id number] DOB: 1945/07/31 Sex: female   REASON FOR VISIT:    Follow up  [place]:    Helen Washington is a 72 y.o. female, who returns today for further follow up of her claudication.  The patient states that previously she had been able to walk at least 3 blocks before she had trouble.  Now she is down to less than 1 block.  She states her right leg bothers her more than the left.  She gets cramping in her calf and the leg gives out.  Her typical daily exercise routine consists of walking for approximately 20 minutes on the treadmill followed by 15 minutes of swimming laps followed by 2 hours of water aerobics.  She denies rest pain or nonhealing wounds.  She does take a daily aspirin. Her last ABI's were 0.54 on the right and 0.75 on the left.  She was given a trial of Cilostazol at her last visit.  She did not have any side effects, but does not think that it made a significant difference  The patient suffers from white coat hypertension.  She has previously been on a statin however this was stopped several years ago and diet and exercise were initiated because her cholesterol profile was adequate.  Her most recent studies show an LDL of 131 with an HDL around 80.  The patient is a former smoker   PAST MEDICAL HISTORY:   Past Medical History:  Diagnosis Date  . Hyperlipidemia   . Hypertension   . Insomnia      FAMILY HISTORY:   Family History  Problem Relation Age of Onset  . Stroke Mother   . Arthritis Mother   . CVA Mother   . Diverticulitis Mother   . Cancer Father   . Hypertension Sister   . Diverticulosis Sister   . Breast cancer Cousin   . Suicidality Brother   . Hypertension Brother   . Anxiety disorder Brother   . Kidney Stones Brother   . Ulcers Brother   . Healthy Daughter     SOCIAL HISTORY:   Social History   Tobacco Use  .  Smoking status: Former Smoker    Packs/day: 1.00    Years: 36.00    Pack years: 36.00    Types: Cigarettes    Last attempt to quit: 04/01/2000    Years since quitting: 17.2  . Smokeless tobacco: Never Used  Substance Use Topics  . Alcohol use: No     ALLERGIES:   Allergies  Allergen Reactions  . Codeine Nausea Only     CURRENT MEDICATIONS:   Current Outpatient Medications  Medication Sig Dispense Refill  . alendronate (FOSAMAX) 70 MG tablet Take 70 mg by mouth once a week. Take with a full glass of water on an empty stomach.    . Aspirin-Caffeine 845-65 MG PACK Take by mouth.    . cilostazol (PLETAL) 100 MG tablet Take 1 tablet (100 mg total) by mouth 2 (two) times daily before a meal. 60 tablet 11  . Vitamin D, Cholecalciferol, 400 units TABS Take by mouth.     No current facility-administered medications for this visit.     REVIEW OF SYSTEMS:   [X]  denotes positive finding, [ ]  denotes negative finding Cardiac  Comments:  Chest pain or chest pressure:    Shortness of breath upon exertion:    Short of breath when lying flat:  Irregular heart rhythm:        Vascular    Pain in calf, thigh, or hip brought on by ambulation: x   Pain in feet at night that wakes you up from your sleep:     Blood clot in your veins:    Leg swelling:         Pulmonary    Oxygen at home:    Productive cough:     Wheezing:         Neurologic    Sudden weakness in arms or legs:     Sudden numbness in arms or legs:     Sudden onset of difficulty speaking or slurred speech:    Temporary loss of vision in one eye:     Problems with dizziness:         Gastrointestinal    Blood in stool:     Vomited blood:         Genitourinary    Burning when urinating:     Blood in urine:        Psychiatric    Major depression:         Hematologic    Bleeding problems:    Problems with blood clotting too easily:        Skin    Rashes or ulcers:        Constitutional    Fever or  chills:      PHYSICAL EXAM:   Vitals:   07/11/17 1356  BP: 103/61  Pulse: (!) 111  Temp: 97.7 F (36.5 C)  TempSrc: Oral  SpO2: 94%  Weight: 121 lb (54.9 kg)  Height: 4\' 11"  (1.499 m)    GENERAL: The patient is a well-nourished female, in no acute distress. The vital signs are documented above. CARDIAC: There is a regular rate and rhythm.  VASCULAR: Palpable femoral pulses.  Nonpalpable pedal pulses PULMONARY: Non-labored respirations ABDOMEN: Soft and non-tender with normal pitched bowel sounds.  MUSCULOSKELETAL: There are no major deformities or cyanosis. NEUROLOGIC: No focal weakness or paresthesias are detected. SKIN: There are no ulcers or rashes noted. PSYCHIATRIC: The patient has a normal affect.  STUDIES:   None  MEDICAL ISSUES:   Claudication: The patient did not have a significant response to cilostazol, therefore I have told her to stop taking this.  She could improve her medical management with the addition of a statin.  She is going to discuss this with her medical doctor in the near future.  At this time, she feels like her symptoms are tolerable.  Therefore, I have recommended that we continue with nonoperative treatment.  She understands that if her symptoms deteriorate or if she develops a nonhealing wound that she will contact me, and the next step will be angiography.  Otherwise, she is scheduled for follow-up visit in 1 year.    [name], MD Vascular and Vein Specialists of [facility] [phone] Pager [phone]

## 2017-08-23 DIAGNOSIS — R69 Illness, unspecified: Secondary | ICD-10-CM | POA: Diagnosis not present

## 2017-08-30 DIAGNOSIS — H1013 Acute atopic conjunctivitis, bilateral: Secondary | ICD-10-CM | POA: Diagnosis not present

## 2017-09-06 DIAGNOSIS — G4733 Obstructive sleep apnea (adult) (pediatric): Secondary | ICD-10-CM | POA: Diagnosis not present

## 2017-10-06 DIAGNOSIS — G4733 Obstructive sleep apnea (adult) (pediatric): Secondary | ICD-10-CM | POA: Diagnosis not present

## 2017-11-07 ENCOUNTER — Ambulatory Visit (INDEPENDENT_AMBULATORY_CARE_PROVIDER_SITE_OTHER): Payer: Medicare HMO | Admitting: Surgery

## 2017-11-07 ENCOUNTER — Other Ambulatory Visit: Payer: Self-pay

## 2017-11-07 ENCOUNTER — Encounter: Payer: Self-pay | Admitting: Surgery

## 2017-11-07 VITALS — BP 140/86 | HR 81 | Temp 97.2°F | Resp 14 | Ht 60.0 in | Wt 118.0 lb

## 2017-11-07 DIAGNOSIS — I70213 Atherosclerosis of native arteries of extremities with intermittent claudication, bilateral legs: Secondary | ICD-10-CM | POA: Diagnosis not present

## 2017-11-07 NOTE — Progress Notes (Signed)
Vascular and Vein Specialist of [facility]  Patient name: Helen Washington MRN: [id number] DOB: July 21, 1945 Sex: female   REASON FOR VISIT:    Follow up  [place]:    Cloe Sockwell a 72 y.o.female, who returns today for further follow up of her claudication. The patient states that previously she had been able to walk at least 3 blocks before she had trouble. Now she is down to less than 1 block. She states her right leg bothers her more than the left. She gets cramping in her calf and the leg gives out. Her typical daily exercise routine consists of walking for approximately 20 minutes on the treadmill followed by 15 minutes of swimming laps followed by 2 hours of water aerobics. She denies rest pain or nonhealing wounds. She does take a daily aspirin. Her last ABI's were 0.54 on the right and 0.75 on the left.  She was given a trial of Cilostazol at her last visit.  She did not have any side effects, but does not think that it made a significant difference  I last saw her in June 2019 and her symptoms were stable.  She is now back because she feels her left leg is a little worse.  The patient suffers from white coat hypertension. She has previously been on a statin however this was stopped several years ago and diet and exercise were initiated because her cholesterol profile was adequate. Her most recent studies show an LDL of 131 with an HDL around 80. The patient is a former smoker  PAST MEDICAL HISTORY:   Past Medical History:  Diagnosis Date  . Hyperlipidemia   . Hypertension   . Insomnia      FAMILY HISTORY:   Family History  Problem Relation Age of Onset  . Stroke Mother   . Arthritis Mother   . CVA Mother   . Diverticulitis Mother   . Cancer Father   . Hypertension Sister   . Diverticulosis Sister   . Breast cancer Cousin   . Suicidality Brother   . Hypertension Brother   . Anxiety disorder Brother   .  Kidney Stones Brother   . Ulcers Brother   . Healthy Daughter     SOCIAL HISTORY:   Social History   Tobacco Use  . Smoking status: Former Smoker    Packs/day: 1.00    Years: 36.00    Pack years: 36.00    Types: Cigarettes    Last attempt to quit: 04/01/2000    Years since quitting: 17.6  . Smokeless tobacco: Never Used  Substance Use Topics  . Alcohol use: No     ALLERGIES:   Allergies  Allergen Reactions  . Codeine Nausea Only     CURRENT MEDICATIONS:   Current Outpatient Medications  Medication Sig Dispense Refill  . alendronate (FOSAMAX) 70 MG tablet Take 70 mg by mouth once a week. Take with a full glass of water on an empty stomach.    . Aspirin-Caffeine 845-65 MG PACK Take by mouth.    . Vitamin D, Cholecalciferol, 400 units TABS Take by mouth.    . cilostazol (PLETAL) 100 MG tablet Take 1 tablet (100 mg total) by mouth 2 (two) times daily before a meal. (Patient not taking: Reported on 11/07/2017) 60 tablet 11   No current facility-administered medications for this visit.     REVIEW OF SYSTEMS:   [X]  denotes positive finding, [ ]  denotes negative finding Cardiac  Comments:  Chest pain  or chest pressure:    Shortness of breath upon exertion:    Short of breath when lying flat:    Irregular heart rhythm:        Vascular    Pain in calf, thigh, or hip brought on by ambulation: x   Pain in feet at night that wakes you up from your sleep:     Blood clot in your veins:    Leg swelling:         Pulmonary    Oxygen at home:    Productive cough:     Wheezing:         Neurologic    Sudden weakness in arms or legs:     Sudden numbness in arms or legs:     Sudden onset of difficulty speaking or slurred speech:    Temporary loss of vision in one eye:     Problems with dizziness:         Gastrointestinal    Blood in stool:     Vomited blood:         Genitourinary    Burning when urinating:     Blood in urine:        Psychiatric    Major  depression:         Hematologic    Bleeding problems:    Problems with blood clotting too easily:        Skin    Rashes or ulcers:        Constitutional    Fever or chills:      PHYSICAL EXAM:   Vitals:   11/07/17 1343  BP: 140/86  Pulse: 81  Resp: 14  Temp: (!) 97.2 F (36.2 C)  TempSrc: Oral  SpO2: 96%  Weight: 118 lb (53.5 kg)  Height: 5' (1.524 m)    GENERAL: The patient is a well-nourished female, in no acute distress. The vital signs are documented above. CARDIAC: There is a regular rate and rhythm.  VASCULAR: Palpable femoral pulses, however the right is more faint in the left PULMONARY: Non-labored respirations MUSCULOSKELETAL: There are no major deformities or cyanosis. NEUROLOGIC: No focal weakness or paresthesias are detected. SKIN: There are no ulcers or rashes noted. PSYCHIATRIC: The patient has a normal affect.  STUDIES:   None  MEDICAL ISSUES:   Claudication: The patient's right leg is more severe than the left, however she still maintains a relatively active lifestyle.  I had a lengthy discussion stating that I would only intervene when her symptoms become disruptive of her quality of life.  She is going to discuss this further with her daughter.  As long as she can tolerate her symptoms she will follow-up with me in 1 year with ABIs and duplex.  If her symptoms progress or she feels she can no longer tolerate them, she can call my office and schedule for angiography.    [name], MD Vascular and Vein Specialists of [facility] [phone] Pager [phone]

## 2017-11-17 ENCOUNTER — Other Ambulatory Visit: Payer: Self-pay | Admitting: Internal Medicine

## 2017-11-17 DIAGNOSIS — Z1231 Encounter for screening mammogram for malignant neoplasm of breast: Secondary | ICD-10-CM

## 2017-12-06 ENCOUNTER — Ambulatory Visit
Admission: RE | Admit: 2017-12-06 | Discharge: 2017-12-06 | Disposition: A | Discharge status: Home / Self Care | Payer: Medicare HMO | Source: Ambulatory Visit | Attending: Internal Medicine | Admitting: Internal Medicine

## 2017-12-06 DIAGNOSIS — Z1231 Encounter for screening mammogram for malignant neoplasm of breast: Secondary | ICD-10-CM | POA: Diagnosis not present

## 2018-01-10 DIAGNOSIS — G4733 Obstructive sleep apnea (adult) (pediatric): Secondary | ICD-10-CM | POA: Diagnosis not present

## 2018-01-31 DIAGNOSIS — R69 Illness, unspecified: Secondary | ICD-10-CM | POA: Diagnosis not present

## 2018-03-08 NOTE — Progress Notes (Deleted)
[facility]  PATIENT: Helen Washington DOB: [birth year]   REASON FOR VISIT: Follow-up for obstructive sleep apnea with  CPAP compliance HISTORY FROM: Patient    HISTORY OF PRESENT ILLNESS:UPDATE 4/2/2019CM [name], 73 year old female returns for follow-up with history of obstructive sleep apnea here for CPAP compliance.  She is doing well and is having no problems with the machine.  She has 2 days which she did not use her CPAP because of being out of town.  Compliance data dated 03/19/2017 04/17/2017 shows compliance for 4 hours 87%.  Average usage 6hours set pressure 9 cm EPR level 3 AHI 1.  ESS 7.  She returns for reevaluation  UPDATE 09/26/2018CM [name], 73 year old female returns for follow-up for initial CPAP compliance report. She has a history of sleep apnea. Compliance report dated 09/13/2016 through 10/12/2016 shows compliance 90% percent 27 days greater than 4 hours. Average usage 6 hours 37 minutes. Set pressure 9 cm. EPR 3. AHI 1.4 ESS 4. She reports much less daytime drowsiness. She returns for reevaluation  CDInterval history from 05/27/2016, I have the pleasure of seeing Helen Washington today. The patient underwent a baseline polysomnography on 05/05/2016 and was diagnosed with mild sleep apnea all obstructive in origin at a frequency of 13.3 per hour of sleep. During REM sleep however her apnea exacerbated to 39.2, her oxygen nadir was low at only 75% saturation and the total desaturation time was very long at 202 minutes for a sleep study that only and calm past 430 minutes. Was for the constellation of findings that CPAP therapy was recommended. The patient has no history of a pulmonary disorder, never suffered pulmonary emboli, does not have COPD or asthma, is not on medication that would suppress her breathing drive. She reports a normal degree of sleepiness not excessive at 7 points on the Epworth score she's not fatigued in daytime at only 12 points of the fatigue  severity scale and she certainly not depressed endorsing 0 points on a 15 point depression questionnaire.  She is concerned about the mask associated with CPAP use. It is her impression that she will get a FFM- I took it upon myself to fit her with a Respironics pediatric size nasal mask, and an AirFit P 10 for which I only had S size inserts , not XS. She is concerned about having to logg it to [place] in July on a 10 day trip- I explained she can take a break.    REVIEW OF SYSTEMS: Full 14 system review of systems performed and notable only for those listed, all others are neg:  Constitutional: neg  Cardiovascular: neg Ear/Nose/Throat: Hearing loss Skin: neg Eyes: neg Respiratory: neg Gastroitestinal: neg  Hematology/Lymphatic: neg  Endocrine: neg Musculoskeletal: Joint pain Allergy/Immunology: neg Neurological: neg Psychiatric: neg Sleep : Obstructive sleep apnea with CPAP   ALLERGIES: Allergies  Allergen Reactions  . Codeine Nausea Only    HOME MEDICATIONS: Outpatient Medications Prior to Visit  Medication Sig Dispense Refill  . alendronate (FOSAMAX) 70 MG tablet Take 70 mg by mouth once a week. Take with a full glass of water on an empty stomach.    . Aspirin-Caffeine 845-65 MG PACK Take by mouth.    . cilostazol (PLETAL) 100 MG tablet Take 1 tablet (100 mg total) by mouth 2 (two) times daily before a meal. (Patient not taking: Reported on 11/07/2017) 60 tablet 11  . Vitamin D, Cholecalciferol, 400 units TABS Take by mouth.     No facility-administered medications  prior to visit.     PAST MEDICAL HISTORY: Past Medical History:  Diagnosis Date  . Hyperlipidemia   . Hypertension   . Insomnia     PAST SURGICAL HISTORY: Past Surgical History:  Procedure Laterality Date  . CATARACT EXTRACTION, BILATERAL  12/2015;01/2016   [facility]  . TUBAL LIGATION  1969    FAMILY HISTORY: Family History  Problem Relation Age of Onset  . Stroke Mother     . Arthritis Mother   . CVA Mother   . Diverticulitis Mother   . Cancer Father   . Hypertension Sister   . Diverticulosis Sister   . Breast cancer Cousin   . Suicidality Brother   . Hypertension Brother   . Anxiety disorder Brother   . Kidney Stones Brother   . Ulcers Brother   . Healthy Daughter     SOCIAL HISTORY: Social History   Socioeconomic History  . Marital status: Divorced    Spouse name: Not on file  . Number of children: 1  . Years of education: 61  . Highest education level: Not on file  Occupational History    Comment: retired Scientist, clinical (histocompatibility and immunogenetics), Sport and exercise psychologist  Social Needs  . Financial resource strain: Not on file  . Food insecurity:    Worry: Not on file    Inability: Not on file  . Transportation needs:    Medical: Not on file    Non-medical: Not on file  Tobacco Use  . Smoking status: Former Smoker    Packs/day: 1.00    Years: 36.00    Pack years: 36.00    Types: Cigarettes    Last attempt to quit: 04/01/2000    Years since quitting: 17.9  . Smokeless tobacco: Never Used  Substance and Sexual Activity  . Alcohol use: No  . Drug use: No  . Sexual activity: Not on file  Lifestyle  . Physical activity:    Days per week: Not on file    Minutes per session: Not on file  . Stress: Not on file  Relationships  . Social connections:    Talks on phone: Not on file    Gets together: Not on file    Attends religious service: Not on file    Active member of club or organization: Not on file    Attends meetings of clubs or organizations: Not on file    Relationship status: Not on file  . Intimate partner violence:    Fear of current or ex partner: Not on file    Emotionally abused: Not on file    Physically abused: Not on file    Forced sexual activity: Not on file  Other Topics Concern  . Not on file  Social History Narrative   Lives with room mate   Caffeine - coffee, 1 cup daily     PHYSICAL EXAM  There were no vitals filed for this visit. There is no  height or weight on file to calculate BMI.  Generalized: Well developed, in no acute distress  Head: normocephalic and atraumatic,. Oropharynx benign Mallopatti 1 Neck: Supple, circumference 14 Musculoskeletal: No deformity   Neurological examination   Mentation: Alert oriented to time, place, history taking. Attention span and concentration appropriate. Recent and remote memory intact.  Follows all commands speech and language fluent. ESS 7  Cranial nerve II-XII: Pupils were equal round reactive to light extraocular movements were full, visual field were full on confrontational test. Facial sensation and strength were normal. hearing was  intact to finger rubbing bilaterally. Uvula tongue midline. head turning and shoulder shrug were normal and symmetric.Tongue protrusion into cheek strength was normal. Motor: normal bulk and tone, full strength in the BUE, BLE,  Sensory: normal and symmetric to light touch, on the face arms and legs Coordination: finger-nose-finger, heel-to-shin bilaterally, no dysmetria Gait and Station: Rising up from seated position without assistance, normal stance,  moderate stride, good arm swing, smooth turning, able to perform tiptoe, and heel walking without difficulty. Tandem gait is steady  DIAGNOSTIC DATA (LABS, IMAGING, TESTING) - I reviewed patient records, labs, notes, testing and imaging myself where available.   ASSESSMENT AND PLAN  73 y.o. year old female  with obstructive sleep apnea here for  CPAP compliance data.Compliance data dated 03/19/2017 04/17/2017 shows compliance for 4 hours 87%.  Average usage 6hours set pressure 9 cm EPR level 3 AHI 1.  ESS 7.  S  PLAN: CPAP compliance good at 87% Continue same settings Follow-up yearly and as needed Patient advised not to operate hazardous machinery or drive when sleepy Patient advised to avoid sleeping on her back and to avoid muscle relaxants and indigestion of alcohol prior to sleeping Follow-up  yearly I spent 15 minutes in total face to face time with the patient more than 50% of which was spent counseling and coordination of care, reviewing test results reviewing medications and discussing and reviewing the diagnosis of obstructive sleep apnea and CPAP compliance Dennie Bible, [facility], APRN  [facility] [address] [place], [place] [address] [phone]

## 2018-03-09 ENCOUNTER — Telehealth: Payer: Self-pay | Admitting: *Deleted

## 2018-03-09 ENCOUNTER — Ambulatory Visit: Payer: Self-pay | Admitting: Nurse Practitioner

## 2018-03-09 NOTE — Telephone Encounter (Signed)
Called pt.  She has not been using her cpap and is not compliant.  Rescheduled her appt with JV/NP 04-12-18 for yearly f/u.  She has been having issues with her legs, (RLegs).  Is seeing provider for her legs.

## 2018-03-13 DIAGNOSIS — R82998 Other abnormal findings in urine: Secondary | ICD-10-CM | POA: Diagnosis not present

## 2018-03-13 DIAGNOSIS — E7849 Other hyperlipidemia: Secondary | ICD-10-CM | POA: Diagnosis not present

## 2018-03-13 DIAGNOSIS — M810 Age-related osteoporosis without current pathological fracture: Secondary | ICD-10-CM | POA: Diagnosis not present

## 2018-03-27 DIAGNOSIS — I739 Peripheral vascular disease, unspecified: Secondary | ICD-10-CM | POA: Diagnosis not present

## 2018-03-27 DIAGNOSIS — M810 Age-related osteoporosis without current pathological fracture: Secondary | ICD-10-CM | POA: Diagnosis not present

## 2018-03-27 DIAGNOSIS — N390 Urinary tract infection, site not specified: Secondary | ICD-10-CM | POA: Diagnosis not present

## 2018-03-27 DIAGNOSIS — R030 Elevated blood-pressure reading, without diagnosis of hypertension: Secondary | ICD-10-CM | POA: Diagnosis not present

## 2018-03-27 DIAGNOSIS — M25552 Pain in left hip: Secondary | ICD-10-CM | POA: Insufficient documentation

## 2018-03-27 DIAGNOSIS — Z23 Encounter for immunization: Secondary | ICD-10-CM | POA: Diagnosis not present

## 2018-03-27 DIAGNOSIS — Z0000 Encounter for general adult medical examination without abnormal findings: Secondary | ICD-10-CM | POA: Diagnosis not present

## 2018-03-27 DIAGNOSIS — E7849 Other hyperlipidemia: Secondary | ICD-10-CM | POA: Diagnosis not present

## 2018-03-27 DIAGNOSIS — G4733 Obstructive sleep apnea (adult) (pediatric): Secondary | ICD-10-CM | POA: Diagnosis not present

## 2018-03-27 DIAGNOSIS — Z6823 Body mass index (BMI) 23.0-23.9, adult: Secondary | ICD-10-CM | POA: Diagnosis not present

## 2018-03-28 DIAGNOSIS — Z1212 Encounter for screening for malignant neoplasm of rectum: Secondary | ICD-10-CM | POA: Diagnosis not present

## 2018-04-11 ENCOUNTER — Telehealth: Payer: Self-pay | Admitting: *Deleted

## 2018-04-11 NOTE — Telephone Encounter (Signed)
Spoke with pt, she was referred to [place] by dr Dagmar Hait for PAD and she is a patient of dr Trula Slade. Discussed with patient, she will follow up with dr Trula Slade.

## 2018-04-12 ENCOUNTER — Telehealth: Payer: Self-pay | Admitting: Adult Health

## 2018-04-12 ENCOUNTER — Ambulatory Visit (INDEPENDENT_AMBULATORY_CARE_PROVIDER_SITE_OTHER): Payer: Medicare HMO | Admitting: Adult Health

## 2018-04-12 ENCOUNTER — Other Ambulatory Visit: Payer: Self-pay

## 2018-04-12 ENCOUNTER — Encounter: Payer: Self-pay | Admitting: Adult Health

## 2018-04-12 ENCOUNTER — Ambulatory Visit: Payer: Medicare HMO | Admitting: Cardiovascular Disease

## 2018-04-12 DIAGNOSIS — G4733 Obstructive sleep apnea (adult) (pediatric): Secondary | ICD-10-CM

## 2018-04-12 DIAGNOSIS — Z9989 Dependence on other enabling machines and devices: Secondary | ICD-10-CM

## 2018-04-12 NOTE — Addendum Note (Signed)
Addended by: Venancio Poisson on: 04/12/2018 08:39 AM   Modules accepted: Orders

## 2018-04-12 NOTE — Telephone Encounter (Signed)
LVM to schedule patient one year follow-up with Megan.

## 2018-04-12 NOTE — Telephone Encounter (Signed)
Can you please schedule I year follow-up for CPAP compliance with Jinny Blossom, NP. Thank you!

## 2018-04-12 NOTE — Progress Notes (Signed)
  [facility] [address] [place]. [place] [address] [phone]     Virtual Visit via Telephone Note  I connected with Helen Washington on 04/12/18 at  8:15 AM EDT by telephone located at [facility] and verified that I am speaking with the correct person using two identifiers who reports being located in her own home.    I discussed the limitations, risks, security and privacy concerns of performing an evaluation and management service by telephone and the availability of in person appointments. I also discussed with the patient that there may be a patient responsible charge related to this service. The patient expressed understanding and agreed to proceed.   History of Present Illness:  Helen Washington is a 73 y.o. female who is being followed in this office for OSA on CPAP management. She was initially scheduled for face-to-face office visit today at this time but due to [place], face-to-face office visit rescheduled for non-face-to-face telephone visit.   CPAP compliance report from 03/07/2018 -04/05/2018 shows 26 out of 30 usage days for 87% compliance and greater than 4 hours 22 days for 73% compliance.  Average usage 5 hours and 50 minutes with residual AHI 1.1.  Leaks in the 95th percentile 25.3 L/min and set pressure 9 cm H2O and EPR level 3.  She does report difficulty tolerating mask as she is a "restless person" and can toss and turn at night which can make it difficult to tolerate CPAP throughout the night due to mask and tubing.  She does report overall feeling good throughout the day without any daytime fatigue or drowsiness.  No further concerns at this time.    Observations/Objective:  Alert and pleasant patient asking appropriate questions  Reviewed compliance report download from 03/07/2018 -04/05/2018  Full physical assessment deferred  Assessment and Plan:  Ms Helen Washington is a 73 year old female who continues to be followed by this office for  OSA and CPAP management.  Her compliance report shows overall compliance satisfactory at 87% but greater than 4 hours submaximal at 73% which she reports is due to restfulness and difficulty tolerating mask at times.  Highly encouraged ensuring she is using mask for greater than 4 hours nightly despite adequate residual AHI.  DME company [employer] advised to contact them for any needed supplies or questions regarding CPAP machine.   Follow Up Instructions:   She will follow-up in 1 year with Jinny Blossom, NP or call earlier if needed    I discussed the assessment and treatment plan with the patient.  The patient was provided an opportunity to ask questions and all were answered to their satisfaction. The patient agreed with the plan and verbalized an understanding of the instructions.   I provided 15 minutes of non-face-to-face time during this encounter.    Venancio Poisson, AGNP-BC  [facility] [address] [place], [place] [address]  Phone [phone] Fax [phone] Note: This document was prepared with digital dictation and possible smart phrase technology. Any transcriptional errors that result from this process are unintentional.

## 2018-04-25 ENCOUNTER — Ambulatory Visit: Payer: Medicare HMO | Admitting: Nurse Practitioner

## 2018-04-26 ENCOUNTER — Ambulatory Visit: Payer: Medicare HMO | Admitting: Neurology

## 2018-05-05 ENCOUNTER — Telehealth: Payer: Self-pay | Admitting: Neurology

## 2018-05-05 NOTE — Telephone Encounter (Signed)
Pt states that [employer] has sent a fax over and are waiting for a signature and she is wanting an update on that. Please advise.

## 2018-05-08 NOTE — Telephone Encounter (Signed)
I have not seen anything come over for the patient at this time. I will reach out to [employer] and see what is needed from [place]. I'll let the patient know once I have completed what is needed.

## 2018-05-09 NOTE — Telephone Encounter (Signed)
Called the patient and informed her that we had not received anything for [place] to sign. The last order we sent was in march so orders should be up to date. Pt verbalized understanding. Advised her to contact [employer] with any questions that she may have. Pt verbalized understanding.

## 2018-05-10 DIAGNOSIS — G4733 Obstructive sleep apnea (adult) (pediatric): Secondary | ICD-10-CM | POA: Diagnosis not present

## 2018-06-30 DIAGNOSIS — R69 Illness, unspecified: Secondary | ICD-10-CM | POA: Diagnosis not present

## 2018-08-10 DIAGNOSIS — G4733 Obstructive sleep apnea (adult) (pediatric): Secondary | ICD-10-CM | POA: Diagnosis not present

## 2018-08-17 DIAGNOSIS — M545 Low back pain: Secondary | ICD-10-CM | POA: Diagnosis not present

## 2018-08-17 DIAGNOSIS — E785 Hyperlipidemia, unspecified: Secondary | ICD-10-CM | POA: Diagnosis not present

## 2018-08-17 DIAGNOSIS — M25552 Pain in left hip: Secondary | ICD-10-CM | POA: Diagnosis not present

## 2018-08-17 NOTE — Progress Notes (Signed)
I agree with the assessment and plan as directed by NP on this visit . I was available for consultation.   Kabeer Hoagland, MD  

## 2018-09-01 DIAGNOSIS — M25552 Pain in left hip: Secondary | ICD-10-CM | POA: Diagnosis not present

## 2018-09-13 DIAGNOSIS — S73192A Other sprain of left hip, initial encounter: Secondary | ICD-10-CM | POA: Diagnosis not present

## 2018-09-13 DIAGNOSIS — M84352A Stress fracture, left femur, initial encounter for fracture: Secondary | ICD-10-CM | POA: Diagnosis not present

## 2018-09-15 DIAGNOSIS — M84352A Stress fracture, left femur, initial encounter for fracture: Secondary | ICD-10-CM | POA: Diagnosis not present

## 2018-09-15 DIAGNOSIS — M1612 Unilateral primary osteoarthritis, left hip: Secondary | ICD-10-CM | POA: Diagnosis not present

## 2018-09-19 DIAGNOSIS — M1612 Unilateral primary osteoarthritis, left hip: Secondary | ICD-10-CM | POA: Diagnosis not present

## 2018-10-10 DIAGNOSIS — R69 Illness, unspecified: Secondary | ICD-10-CM | POA: Diagnosis not present

## 2018-10-17 DIAGNOSIS — M7062 Trochanteric bursitis, left hip: Secondary | ICD-10-CM | POA: Diagnosis not present

## 2018-10-20 DIAGNOSIS — M25652 Stiffness of left hip, not elsewhere classified: Secondary | ICD-10-CM | POA: Diagnosis not present

## 2018-10-20 DIAGNOSIS — M6281 Muscle weakness (generalized): Secondary | ICD-10-CM | POA: Diagnosis not present

## 2018-10-20 DIAGNOSIS — R2681 Unsteadiness on feet: Secondary | ICD-10-CM | POA: Diagnosis not present

## 2018-10-20 DIAGNOSIS — M25552 Pain in left hip: Secondary | ICD-10-CM | POA: Diagnosis not present

## 2018-10-23 DIAGNOSIS — M25552 Pain in left hip: Secondary | ICD-10-CM | POA: Diagnosis not present

## 2018-10-23 DIAGNOSIS — M6281 Muscle weakness (generalized): Secondary | ICD-10-CM | POA: Diagnosis not present

## 2018-10-23 DIAGNOSIS — R2681 Unsteadiness on feet: Secondary | ICD-10-CM | POA: Diagnosis not present

## 2018-10-23 DIAGNOSIS — M25652 Stiffness of left hip, not elsewhere classified: Secondary | ICD-10-CM | POA: Diagnosis not present

## 2018-10-24 DIAGNOSIS — R69 Illness, unspecified: Secondary | ICD-10-CM | POA: Diagnosis not present

## 2018-10-31 DIAGNOSIS — R2681 Unsteadiness on feet: Secondary | ICD-10-CM | POA: Diagnosis not present

## 2018-10-31 DIAGNOSIS — M25652 Stiffness of left hip, not elsewhere classified: Secondary | ICD-10-CM | POA: Diagnosis not present

## 2018-10-31 DIAGNOSIS — M25552 Pain in left hip: Secondary | ICD-10-CM | POA: Diagnosis not present

## 2018-10-31 DIAGNOSIS — M6281 Muscle weakness (generalized): Secondary | ICD-10-CM | POA: Diagnosis not present

## 2018-11-02 DIAGNOSIS — M6281 Muscle weakness (generalized): Secondary | ICD-10-CM | POA: Diagnosis not present

## 2018-11-02 DIAGNOSIS — M25652 Stiffness of left hip, not elsewhere classified: Secondary | ICD-10-CM | POA: Diagnosis not present

## 2018-11-02 DIAGNOSIS — R2681 Unsteadiness on feet: Secondary | ICD-10-CM | POA: Diagnosis not present

## 2018-11-02 DIAGNOSIS — M25552 Pain in left hip: Secondary | ICD-10-CM | POA: Diagnosis not present

## 2018-11-09 DIAGNOSIS — M25652 Stiffness of left hip, not elsewhere classified: Secondary | ICD-10-CM | POA: Diagnosis not present

## 2018-11-09 DIAGNOSIS — M6281 Muscle weakness (generalized): Secondary | ICD-10-CM | POA: Diagnosis not present

## 2018-11-09 DIAGNOSIS — R2681 Unsteadiness on feet: Secondary | ICD-10-CM | POA: Diagnosis not present

## 2018-11-09 DIAGNOSIS — M25552 Pain in left hip: Secondary | ICD-10-CM | POA: Diagnosis not present

## 2018-11-10 DIAGNOSIS — G4733 Obstructive sleep apnea (adult) (pediatric): Secondary | ICD-10-CM | POA: Diagnosis not present

## 2018-11-14 DIAGNOSIS — M25552 Pain in left hip: Secondary | ICD-10-CM | POA: Diagnosis not present

## 2018-11-14 DIAGNOSIS — M6281 Muscle weakness (generalized): Secondary | ICD-10-CM | POA: Diagnosis not present

## 2018-11-14 DIAGNOSIS — M25652 Stiffness of left hip, not elsewhere classified: Secondary | ICD-10-CM | POA: Diagnosis not present

## 2018-11-14 DIAGNOSIS — R2681 Unsteadiness on feet: Secondary | ICD-10-CM | POA: Diagnosis not present

## 2018-11-16 DIAGNOSIS — M25652 Stiffness of left hip, not elsewhere classified: Secondary | ICD-10-CM | POA: Diagnosis not present

## 2018-11-16 DIAGNOSIS — M6281 Muscle weakness (generalized): Secondary | ICD-10-CM | POA: Diagnosis not present

## 2018-11-16 DIAGNOSIS — R2681 Unsteadiness on feet: Secondary | ICD-10-CM | POA: Diagnosis not present

## 2018-11-16 DIAGNOSIS — M25552 Pain in left hip: Secondary | ICD-10-CM | POA: Diagnosis not present

## 2018-11-21 DIAGNOSIS — M25552 Pain in left hip: Secondary | ICD-10-CM | POA: Diagnosis not present

## 2018-11-21 DIAGNOSIS — R2681 Unsteadiness on feet: Secondary | ICD-10-CM | POA: Diagnosis not present

## 2018-11-21 DIAGNOSIS — M6281 Muscle weakness (generalized): Secondary | ICD-10-CM | POA: Diagnosis not present

## 2018-11-21 DIAGNOSIS — M25652 Stiffness of left hip, not elsewhere classified: Secondary | ICD-10-CM | POA: Diagnosis not present

## 2018-11-23 DIAGNOSIS — R2681 Unsteadiness on feet: Secondary | ICD-10-CM | POA: Diagnosis not present

## 2018-11-23 DIAGNOSIS — M25652 Stiffness of left hip, not elsewhere classified: Secondary | ICD-10-CM | POA: Diagnosis not present

## 2018-11-23 DIAGNOSIS — M6281 Muscle weakness (generalized): Secondary | ICD-10-CM | POA: Diagnosis not present

## 2018-11-23 DIAGNOSIS — M25552 Pain in left hip: Secondary | ICD-10-CM | POA: Diagnosis not present

## 2018-11-24 DIAGNOSIS — M1612 Unilateral primary osteoarthritis, left hip: Secondary | ICD-10-CM | POA: Diagnosis not present

## 2018-11-24 DIAGNOSIS — M7062 Trochanteric bursitis, left hip: Secondary | ICD-10-CM | POA: Diagnosis not present

## 2018-11-28 DIAGNOSIS — M25652 Stiffness of left hip, not elsewhere classified: Secondary | ICD-10-CM | POA: Diagnosis not present

## 2018-11-28 DIAGNOSIS — M25552 Pain in left hip: Secondary | ICD-10-CM | POA: Diagnosis not present

## 2018-11-28 DIAGNOSIS — R2681 Unsteadiness on feet: Secondary | ICD-10-CM | POA: Diagnosis not present

## 2018-11-28 DIAGNOSIS — M6281 Muscle weakness (generalized): Secondary | ICD-10-CM | POA: Diagnosis not present

## 2018-12-05 DIAGNOSIS — M25652 Stiffness of left hip, not elsewhere classified: Secondary | ICD-10-CM | POA: Diagnosis not present

## 2018-12-05 DIAGNOSIS — M25552 Pain in left hip: Secondary | ICD-10-CM | POA: Diagnosis not present

## 2018-12-05 DIAGNOSIS — M6281 Muscle weakness (generalized): Secondary | ICD-10-CM | POA: Diagnosis not present

## 2018-12-05 DIAGNOSIS — R2681 Unsteadiness on feet: Secondary | ICD-10-CM | POA: Diagnosis not present

## 2018-12-07 DIAGNOSIS — R2681 Unsteadiness on feet: Secondary | ICD-10-CM | POA: Diagnosis not present

## 2018-12-07 DIAGNOSIS — M25552 Pain in left hip: Secondary | ICD-10-CM | POA: Diagnosis not present

## 2018-12-07 DIAGNOSIS — M25652 Stiffness of left hip, not elsewhere classified: Secondary | ICD-10-CM | POA: Diagnosis not present

## 2018-12-07 DIAGNOSIS — M6281 Muscle weakness (generalized): Secondary | ICD-10-CM | POA: Diagnosis not present

## 2018-12-12 DIAGNOSIS — R2681 Unsteadiness on feet: Secondary | ICD-10-CM | POA: Diagnosis not present

## 2018-12-12 DIAGNOSIS — M6281 Muscle weakness (generalized): Secondary | ICD-10-CM | POA: Diagnosis not present

## 2018-12-12 DIAGNOSIS — M25552 Pain in left hip: Secondary | ICD-10-CM | POA: Diagnosis not present

## 2018-12-12 DIAGNOSIS — M25652 Stiffness of left hip, not elsewhere classified: Secondary | ICD-10-CM | POA: Diagnosis not present

## 2018-12-19 DIAGNOSIS — R2681 Unsteadiness on feet: Secondary | ICD-10-CM | POA: Diagnosis not present

## 2018-12-19 DIAGNOSIS — M25652 Stiffness of left hip, not elsewhere classified: Secondary | ICD-10-CM | POA: Diagnosis not present

## 2018-12-19 DIAGNOSIS — M25552 Pain in left hip: Secondary | ICD-10-CM | POA: Diagnosis not present

## 2018-12-19 DIAGNOSIS — M6281 Muscle weakness (generalized): Secondary | ICD-10-CM | POA: Diagnosis not present

## 2018-12-21 DIAGNOSIS — R2681 Unsteadiness on feet: Secondary | ICD-10-CM | POA: Diagnosis not present

## 2018-12-21 DIAGNOSIS — M25652 Stiffness of left hip, not elsewhere classified: Secondary | ICD-10-CM | POA: Diagnosis not present

## 2018-12-21 DIAGNOSIS — M25552 Pain in left hip: Secondary | ICD-10-CM | POA: Diagnosis not present

## 2018-12-21 DIAGNOSIS — M6281 Muscle weakness (generalized): Secondary | ICD-10-CM | POA: Diagnosis not present

## 2019-01-17 ENCOUNTER — Other Ambulatory Visit: Payer: Self-pay | Admitting: Internal Medicine

## 2019-01-17 DIAGNOSIS — Z1231 Encounter for screening mammogram for malignant neoplasm of breast: Secondary | ICD-10-CM

## 2019-02-13 DIAGNOSIS — G4733 Obstructive sleep apnea (adult) (pediatric): Secondary | ICD-10-CM | POA: Diagnosis not present

## 2019-02-15 DIAGNOSIS — Z961 Presence of intraocular lens: Secondary | ICD-10-CM | POA: Diagnosis not present

## 2019-02-15 DIAGNOSIS — H52203 Unspecified astigmatism, bilateral: Secondary | ICD-10-CM | POA: Diagnosis not present

## 2019-02-15 DIAGNOSIS — H35371 Puckering of macula, right eye: Secondary | ICD-10-CM | POA: Diagnosis not present

## 2019-03-01 ENCOUNTER — Ambulatory Visit: Payer: Medicare HMO

## 2019-03-06 DIAGNOSIS — R69 Illness, unspecified: Secondary | ICD-10-CM | POA: Diagnosis not present

## 2019-03-26 ENCOUNTER — Ambulatory Visit
Admission: RE | Admit: 2019-03-26 | Discharge: 2019-03-26 | Disposition: A | Discharge status: Home / Self Care | Payer: Medicare HMO | Source: Ambulatory Visit | Attending: Internal Medicine | Admitting: Internal Medicine

## 2019-03-26 ENCOUNTER — Other Ambulatory Visit: Payer: Self-pay

## 2019-03-26 DIAGNOSIS — Z1231 Encounter for screening mammogram for malignant neoplasm of breast: Secondary | ICD-10-CM

## 2019-04-16 ENCOUNTER — Ambulatory Visit: Payer: Medicare HMO | Admitting: Adult Health

## 2019-04-16 DIAGNOSIS — E7849 Other hyperlipidemia: Secondary | ICD-10-CM | POA: Diagnosis not present

## 2019-04-16 DIAGNOSIS — M810 Age-related osteoporosis without current pathological fracture: Secondary | ICD-10-CM | POA: Diagnosis not present

## 2019-04-16 DIAGNOSIS — Z0000 Encounter for general adult medical examination without abnormal findings: Secondary | ICD-10-CM | POA: Diagnosis not present

## 2019-04-23 DIAGNOSIS — Z1331 Encounter for screening for depression: Secondary | ICD-10-CM | POA: Diagnosis not present

## 2019-04-23 DIAGNOSIS — R82998 Other abnormal findings in urine: Secondary | ICD-10-CM | POA: Diagnosis not present

## 2019-04-23 DIAGNOSIS — G4733 Obstructive sleep apnea (adult) (pediatric): Secondary | ICD-10-CM | POA: Diagnosis not present

## 2019-04-23 DIAGNOSIS — R0989 Other specified symptoms and signs involving the circulatory and respiratory systems: Secondary | ICD-10-CM | POA: Diagnosis not present

## 2019-04-23 DIAGNOSIS — E785 Hyperlipidemia, unspecified: Secondary | ICD-10-CM | POA: Diagnosis not present

## 2019-04-23 DIAGNOSIS — I739 Peripheral vascular disease, unspecified: Secondary | ICD-10-CM | POA: Diagnosis not present

## 2019-04-23 DIAGNOSIS — G4700 Insomnia, unspecified: Secondary | ICD-10-CM | POA: Diagnosis not present

## 2019-04-23 DIAGNOSIS — Z0000 Encounter for general adult medical examination without abnormal findings: Secondary | ICD-10-CM | POA: Diagnosis not present

## 2019-04-23 DIAGNOSIS — M810 Age-related osteoporosis without current pathological fracture: Secondary | ICD-10-CM | POA: Diagnosis not present

## 2019-04-23 DIAGNOSIS — Z1212 Encounter for screening for malignant neoplasm of rectum: Secondary | ICD-10-CM | POA: Diagnosis not present

## 2019-04-23 DIAGNOSIS — R030 Elevated blood-pressure reading, without diagnosis of hypertension: Secondary | ICD-10-CM | POA: Diagnosis not present

## 2019-04-23 LAB — IFOBT (OCCULT BLOOD): IFOBT: NEGATIVE

## 2019-04-25 ENCOUNTER — Encounter: Payer: Self-pay | Admitting: Cardiology

## 2019-04-30 ENCOUNTER — Other Ambulatory Visit: Payer: Self-pay | Admitting: *Deleted

## 2019-04-30 DIAGNOSIS — R0989 Other specified symptoms and signs involving the circulatory and respiratory systems: Secondary | ICD-10-CM

## 2019-04-30 DIAGNOSIS — I70213 Atherosclerosis of native arteries of extremities with intermittent claudication, bilateral legs: Secondary | ICD-10-CM

## 2019-05-01 ENCOUNTER — Other Ambulatory Visit: Payer: Self-pay

## 2019-05-01 ENCOUNTER — Ambulatory Visit (HOSPITAL_COMMUNITY)
Admission: RE | Admit: 2019-05-01 | Discharge: 2019-05-01 | Disposition: A | Discharge status: Home / Self Care | Payer: Medicare HMO | Source: Ambulatory Visit | Attending: Surgery | Admitting: Surgery

## 2019-05-01 DIAGNOSIS — R0989 Other specified symptoms and signs involving the circulatory and respiratory systems: Secondary | ICD-10-CM

## 2019-05-07 ENCOUNTER — Ambulatory Visit (HOSPITAL_COMMUNITY)
Admission: RE | Admit: 2019-05-07 | Discharge: 2019-05-07 | Disposition: A | Discharge status: Home / Self Care | Payer: Medicare HMO | Source: Ambulatory Visit | Attending: Surgery | Admitting: Surgery

## 2019-05-07 ENCOUNTER — Other Ambulatory Visit: Payer: Self-pay

## 2019-05-07 ENCOUNTER — Ambulatory Visit (INDEPENDENT_AMBULATORY_CARE_PROVIDER_SITE_OTHER): Payer: Medicare HMO | Admitting: Physician Assistant

## 2019-05-07 ENCOUNTER — Ambulatory Visit (INDEPENDENT_AMBULATORY_CARE_PROVIDER_SITE_OTHER)
Admission: RE | Admit: 2019-05-07 | Discharge: 2019-05-07 | Disposition: A | Discharge status: Home / Self Care | Payer: Medicare HMO | Source: Ambulatory Visit | Attending: Surgery | Admitting: Surgery

## 2019-05-07 VITALS — BP 142/78 | HR 73 | Temp 98.2°F | Resp 16 | Ht 60.0 in | Wt 120.7 lb

## 2019-05-07 DIAGNOSIS — R0989 Other specified symptoms and signs involving the circulatory and respiratory systems: Secondary | ICD-10-CM

## 2019-05-07 DIAGNOSIS — I6523 Occlusion and stenosis of bilateral carotid arteries: Secondary | ICD-10-CM

## 2019-05-07 DIAGNOSIS — I739 Peripheral vascular disease, unspecified: Secondary | ICD-10-CM

## 2019-05-07 DIAGNOSIS — I70213 Atherosclerosis of native arteries of extremities with intermittent claudication, bilateral legs: Secondary | ICD-10-CM

## 2019-05-07 NOTE — H&P (View-Only) (Signed)
Office Note     CC:  follow up Requesting Provider:  Prince Solian, MD  HPI: Helen Washington is a 74 y.o. (02-23-1945) female who presents for follow-up of LE claudication and carotid artery stenosis.  Her last visit was approximately 18 months ago where she was complaining of right greater than left lower extremity pain with exercise.  She was placed on Pletal in the past without significant improvement in symptoms and this was discontinued.  Today she states she is no longer able to vacuum clean her house due to posterior right lower extremity pain.  In addition, she is no longer walking her dogs around the block as she did previously.  She discussed statin therapy with her primary care physician last year and has been placed on rouvastatin daily.  She denies any skin issues or ulcerations.  She denies temporary monocular blindness, slurred speech, facial drooping or extremity weakness.  She underwent duplex study of the carotids recently for recent detection of bilateral bruits.  She has had not previous vascular intervention/surgery.  No prior history of CVA or TIA.  The pt is on a statin for cholesterol management.  The pt not on a daily aspirin.   Other AC: The pt not on meds for hypertension.   The pt is not diabetic.   Tobacco hx:  Former Scientist, research (physical sciences); quit 2002  Past Medical History:  Diagnosis Date  . Hyperlipidemia   . Hypertension   . Insomnia     Past Surgical History:  Procedure Laterality Date  . CATARACT EXTRACTION, BILATERAL  12/2015;01/2016   [facility]  . TUBAL LIGATION  1969    Social History   Socioeconomic History  . Marital status: Divorced    Spouse name: Not on file  . Number of children: 1  . Years of education: 40  . Highest education level: Not on file  Occupational History    Comment: retired Scientist, clinical (histocompatibility and immunogenetics), typist  Tobacco Use  . Smoking status: Former Smoker    Packs/day: 1.00    Years: 36.00    Pack years: 36.00    Types:  Cigarettes    Quit date: 04/01/2000    Years since quitting: 19.1  . Smokeless tobacco: Never Used  Substance and Sexual Activity  . Alcohol use: No  . Drug use: No  . Sexual activity: Not on file  Other Topics Concern  . Not on file  Social History Narrative   Lives with room mate   Caffeine - coffee, 1 cup daily   Social Determinants of Health   Financial Resource Strain:   . Difficulty of Paying Living Expenses:   Food Insecurity:   . Worried About Charity fundraiser in the Last Year:   . Arboriculturist in the Last Year:   Transportation Needs:   . Film/video editor (Medical):   [name] Lack of Transportation (Non-Medical):   Physical Activity:   . Days of Exercise per Week:   . Minutes of Exercise per Session:   Stress:   . Feeling of Stress :   Social Connections:   . Frequency of Communication with Friends and Family:   . Frequency of Social Gatherings with Friends and Family:   . Attends Religious Services:   . Active Member of Clubs or Organizations:   . Attends Archivist Meetings:   [name] Marital Status:   Intimate Partner Violence:   . Fear of Current or Ex-Partner:   . Emotionally Abused:   .  Physically Abused:   . Sexually Abused:     Family History  Problem Relation Age of Onset  . Stroke Mother   . Arthritis Mother   . CVA Mother   . Diverticulitis Mother   . Cancer Father   . Hypertension Sister   . Diverticulosis Sister   . Breast cancer Cousin   . Suicidality Brother   . Hypertension Brother   . Anxiety disorder Brother   . Kidney Stones Brother   . Ulcers Brother   . Healthy Daughter     Current Outpatient Medications  Medication Sig Dispense Refill  . alendronate (FOSAMAX) 70 MG tablet Take 70 mg by mouth once a week. Take with a full glass of water on an empty stomach.    . Aspirin-Caffeine 845-65 MG PACK Take by mouth.    . cilostazol (PLETAL) 100 MG tablet Take 1 tablet (100 mg total) by mouth 2 (two) times daily before a  meal. (Patient not taking: Reported on 11/07/2017) 60 tablet 11  . Vitamin D, Cholecalciferol, 400 units TABS Take by mouth.     No current facility-administered medications for this visit.    Allergies  Allergen Reactions  . Codeine Nausea Only     REVIEW OF SYSTEMS:   [X]  denotes positive finding, [ ]  denotes negative finding Cardiac  Comments:  Chest pain or chest pressure:    Shortness of breath upon exertion:    Short of breath when lying flat:    Irregular heart rhythm:        Vascular    Pain in calf, thigh, or hip brought on by ambulation: x   Pain in feet at night that wakes you up from your sleep:     Blood clot in your veins:    Leg swelling:         Pulmonary    Oxygen at home:    Productive cough:     Wheezing:         Neurologic    Sudden weakness in arms or legs:     Sudden numbness in arms or legs:     Sudden onset of difficulty speaking or slurred speech:    Temporary loss of vision in one eye:     Problems with dizziness:         Gastrointestinal    Blood in stool:     Vomited blood:         Genitourinary    Burning when urinating:     Blood in urine:        Psychiatric    Major depression:         Hematologic    Bleeding problems:    Problems with blood clotting too easily:        Skin    Rashes or ulcers:        Constitutional    Fever or chills:      PHYSICAL EXAMINATION: Vitals:   05/07/19 1130  Weight: 120 lb 11.2 oz (54.7 kg)  Height: 5' (1.524 m)    General:  WDWN in NAD; vital signs documented above Gait: No ataxia.  Ambulates without assistance HENT: WNL, normocephalic Pulmonary: normal non-labored breathing , without Rales, rhonchi,  wheezing Cardiac: regular HR, without  Murmurs with carotid bruits Abdomen: soft, NT, no masses Skin: without rashes Vascular Exam/Pulses: Extremities: without ischemic changes, without Gangrene , without cellulitis; without open wounds; both feet are warm and well perfused.   Sensation intact. Musculoskeletal: no muscle wasting  or atrophy.  Normal strength and active range of motion  Neurologic: A&O X 3;  No focal weakness or paresthesias are detected Psychiatric:  The pt has Normal affect.  Pulse exam: She has 2+ palpable bilateral brachial, radial pulses.  Weakly palpable right femoral pulse.  I am unable to palpate left femoral pulse or lower extremity pulses.  Non-Invasive Vascular Imaging:   05/07/2019  ABI/TBIToday's ABIToday's TBIPrevious ABIPrevious TBI  +-------+-----------+-----------+------------+------------+  Right 0.52    0.20    0.54    0.35      +-------+-----------+-----------+------------+------------+  Left  0.79    0.36    0.75    0.54      Right: 30-49% stenosis noted in the common femoral artery. 75-99% stenosis  noted in the popliteal artery.   Left: 50-74% stenosis noted in the common femoral artery. 50-74% stenosis  noted in the popliteal artery.   Labs on 04/23/2019: cholesterol: 176, trigs: 55, HDL: 82, LDL: 83    Creatinine: 1.0  Assessment and Plan: 74 y.o. female here for follow up for peripheral arterial disease with right lower extremity limiting claudication and duplex sonography evidence of approximately 75 to 99% stenosis of the mid popliteal artery.  Additionally, stenosis noted of both common femoral arteries bilaterally and the mid popliteal artery on the left.  We discussed continued conservative management of this versus proceeding with arteriogram.  Due to the fact that her exercise tolerance is decreased, we will plan arteriogram with Dr. Trula Slade.  She is in agreement with this plan.   Duplex ultrasound of carotid arteries in 3 years.  Barbie Banner, PA-C [facility] [phone]  Clinic MD:   Trula Slade

## 2019-05-07 NOTE — Progress Notes (Signed)
Office Note     CC:  follow up Requesting Provider:  Prince Solian, MD  HPI: Jendayi Hephner is a 74 y.o. (02-10-1945) female who presents for follow-up of LE claudication and carotid artery stenosis.  Her last visit was approximately 18 months ago where she was complaining of right greater than left lower extremity pain with exercise.  She was placed on Pletal in the past without significant improvement in symptoms and this was discontinued.  Today she states she is no longer able to vacuum clean her house due to posterior right lower extremity pain.  In addition, she is no longer walking her dogs around the block as she did previously.  She discussed statin therapy with her primary care physician last year and has been placed on rouvastatin daily.  She denies any skin issues or ulcerations.  She denies temporary monocular blindness, slurred speech, facial drooping or extremity weakness.  She underwent duplex study of the carotids recently for recent detection of bilateral bruits.  She has had not previous vascular intervention/surgery.  No prior history of CVA or TIA.  The pt is on a statin for cholesterol management.  The pt not on a daily aspirin.   Other AC: The pt not on meds for hypertension.   The pt is not diabetic.   Tobacco hx:  Former Scientist, research (physical sciences); quit 2002  Past Medical History:  Diagnosis Date  . Hyperlipidemia   . Hypertension   . Insomnia     Past Surgical History:  Procedure Laterality Date  . CATARACT EXTRACTION, BILATERAL  12/2015;01/2016   [facility] [place] [facility]  . TUBAL LIGATION  1969    Social History   Socioeconomic History  . Marital status: Divorced    Spouse name: Not on file  . Number of children: 1  . Years of education: 16  . Highest education level: Not on file  Occupational History    Comment: retired Scientist, clinical (histocompatibility and immunogenetics), typist  Tobacco Use  . Smoking status: Former Smoker    Packs/day: 1.00    Years: 36.00    Pack years: 36.00    Types:  Cigarettes    Quit date: 04/01/2000    Years since quitting: 19.1  . Smokeless tobacco: Never Used  Substance and Sexual Activity  . Alcohol use: No  . Drug use: No  . Sexual activity: Not on file  Other Topics Concern  . Not on file  Social History Narrative   Lives with room mate   Caffeine - coffee, 1 cup daily   Social Determinants of Health   Financial Resource Strain:   . Difficulty of Paying Living Expenses:   Food Insecurity:   . Worried About Charity fundraiser in the Last Year:   . Arboriculturist in the Last Year:   Transportation Needs:   . Film/video editor (Medical):   [name] Lack of Transportation (Non-Medical):   Physical Activity:   . Days of Exercise per Week:   . Minutes of Exercise per Session:   Stress:   . Feeling of Stress :   Social Connections:   . Frequency of Communication with Friends and Family:   . Frequency of Social Gatherings with Friends and Family:   . Attends Religious Services:   . Active Member of Clubs or Organizations:   . Attends Archivist Meetings:   [name] Marital Status:   Intimate Partner Violence:   . Fear of Current or Ex-Partner:   . Emotionally Abused:   .  Physically Abused:   . Sexually Abused:     Family History  Problem Relation Age of Onset  . Stroke Mother   . Arthritis Mother   . CVA Mother   . Diverticulitis Mother   . Cancer Father   . Hypertension Sister   . Diverticulosis Sister   . Breast cancer Cousin   . Suicidality Brother   . Hypertension Brother   . Anxiety disorder Brother   . Kidney Stones Brother   . Ulcers Brother   . Healthy Daughter     Current Outpatient Medications  Medication Sig Dispense Refill  . alendronate (FOSAMAX) 70 MG tablet Take 70 mg by mouth once a week. Take with a full glass of water on an empty stomach.    . Aspirin-Caffeine 845-65 MG PACK Take by mouth.    . cilostazol (PLETAL) 100 MG tablet Take 1 tablet (100 mg total) by mouth 2 (two) times daily before a  meal. (Patient not taking: Reported on 11/07/2017) 60 tablet 11  . Vitamin D, Cholecalciferol, 400 units TABS Take by mouth.     No current facility-administered medications for this visit.    Allergies  Allergen Reactions  . Codeine Nausea Only     REVIEW OF SYSTEMS:   [X]  denotes positive finding, [ ]  denotes negative finding Cardiac  Comments:  Chest pain or chest pressure:    Shortness of breath upon exertion:    Short of breath when lying flat:    Irregular heart rhythm:        Vascular    Pain in calf, thigh, or hip brought on by ambulation: x   Pain in feet at night that wakes you up from your sleep:     Blood clot in your veins:    Leg swelling:         Pulmonary    Oxygen at home:    Productive cough:     Wheezing:         Neurologic    Sudden weakness in arms or legs:     Sudden numbness in arms or legs:     Sudden onset of difficulty speaking or slurred speech:    Temporary loss of vision in one eye:     Problems with dizziness:         Gastrointestinal    Blood in stool:     Vomited blood:         Genitourinary    Burning when urinating:     Blood in urine:        Psychiatric    Major depression:         Hematologic    Bleeding problems:    Problems with blood clotting too easily:        Skin    Rashes or ulcers:        Constitutional    Fever or chills:      PHYSICAL EXAMINATION: Vitals:   05/07/19 1130  Weight: 120 lb 11.2 oz (54.7 kg)  Height: 5' (1.524 m)    General:  WDWN in NAD; vital signs documented above Gait: No ataxia.  Ambulates without assistance HENT: WNL, normocephalic Pulmonary: normal non-labored breathing , without Rales, rhonchi,  wheezing Cardiac: regular HR, without  Murmurs with carotid bruits Abdomen: soft, NT, no masses Skin: without rashes Vascular Exam/Pulses: Extremities: without ischemic changes, without Gangrene , without cellulitis; without open wounds; both feet are warm and well perfused.   Sensation intact. Musculoskeletal: no muscle wasting  or atrophy.  Normal strength and active range of motion  Neurologic: A&O X 3;  No focal weakness or paresthesias are detected Psychiatric:  The pt has Normal affect.  Pulse exam: She has 2+ palpable bilateral brachial, radial pulses.  Weakly palpable right femoral pulse.  I am unable to palpate left femoral pulse or lower extremity pulses.  Non-Invasive Vascular Imaging:   05/07/2019  ABI/TBIToday's ABIToday's TBIPrevious ABIPrevious TBI  +-------+-----------+-----------+------------+------------+  Right 0.52    0.20    0.54    0.35      +-------+-----------+-----------+------------+------------+  Left  0.79    0.36    0.75    0.54      Right: 30-49% stenosis noted in the common femoral artery. 75-99% stenosis  noted in the popliteal artery.   Left: 50-74% stenosis noted in the common femoral artery. 50-74% stenosis  noted in the popliteal artery.   Labs on 04/23/2019: cholesterol: 176, trigs: 55, HDL: 82, LDL: 83    Creatinine: 1.0  Assessment and Plan: 74 y.o. female here for follow up for peripheral arterial disease with right lower extremity limiting claudication and duplex sonography evidence of approximately 75 to 99% stenosis of the mid popliteal artery.  Additionally, stenosis noted of both common femoral arteries bilaterally and the mid popliteal artery on the left.  We discussed continued conservative management of this versus proceeding with arteriogram.  Due to the fact that her exercise tolerance is decreased, we will plan arteriogram with Dr. Trula Slade.  She is in agreement with this plan.   Duplex ultrasound of carotid arteries in 3 years.  Barbie Banner, PA-C [facility] [phone]  Clinic MD:   Trula Slade

## 2019-05-14 ENCOUNTER — Other Ambulatory Visit (HOSPITAL_COMMUNITY)
Admission: RE | Admit: 2019-05-14 | Discharge: 2019-05-14 | Disposition: A | Discharge status: Home / Self Care | Payer: Medicare HMO | Source: Ambulatory Visit | Attending: Surgery | Admitting: Surgery

## 2019-05-14 DIAGNOSIS — Z01812 Encounter for preprocedural laboratory examination: Secondary | ICD-10-CM | POA: Insufficient documentation

## 2019-05-14 DIAGNOSIS — Z20822 Contact with and (suspected) exposure to covid-19: Secondary | ICD-10-CM | POA: Insufficient documentation

## 2019-05-14 LAB — SARS CORONAVIRUS 2 (TAT 6-24 HRS): SARS Coronavirus 2: NEGATIVE

## 2019-05-15 ENCOUNTER — Other Ambulatory Visit: Payer: Self-pay

## 2019-05-15 ENCOUNTER — Ambulatory Visit (HOSPITAL_COMMUNITY)
Admission: RE | Admit: 2019-05-15 | Discharge: 2019-05-15 | Disposition: A | Discharge status: Home / Self Care | Payer: Medicare HMO | Attending: Surgery | Admitting: Surgery

## 2019-05-15 ENCOUNTER — Encounter (HOSPITAL_COMMUNITY)
Admission: RE | Disposition: A | Discharge status: Home / Self Care | Payer: Self-pay | Source: Home / Self Care | Attending: Surgery

## 2019-05-15 DIAGNOSIS — Z885 Allergy status to narcotic agent status: Secondary | ICD-10-CM | POA: Insufficient documentation

## 2019-05-15 DIAGNOSIS — E785 Hyperlipidemia, unspecified: Secondary | ICD-10-CM | POA: Diagnosis not present

## 2019-05-15 DIAGNOSIS — I6529 Occlusion and stenosis of unspecified carotid artery: Secondary | ICD-10-CM | POA: Diagnosis not present

## 2019-05-15 DIAGNOSIS — I10 Essential (primary) hypertension: Secondary | ICD-10-CM | POA: Diagnosis not present

## 2019-05-15 DIAGNOSIS — Z87891 Personal history of nicotine dependence: Secondary | ICD-10-CM | POA: Diagnosis not present

## 2019-05-15 DIAGNOSIS — G4700 Insomnia, unspecified: Secondary | ICD-10-CM | POA: Diagnosis not present

## 2019-05-15 DIAGNOSIS — I70213 Atherosclerosis of native arteries of extremities with intermittent claudication, bilateral legs: Secondary | ICD-10-CM | POA: Insufficient documentation

## 2019-05-15 DIAGNOSIS — Z7982 Long term (current) use of aspirin: Secondary | ICD-10-CM | POA: Diagnosis not present

## 2019-05-15 DIAGNOSIS — Z8249 Family history of ischemic heart disease and other diseases of the circulatory system: Secondary | ICD-10-CM | POA: Insufficient documentation

## 2019-05-15 DIAGNOSIS — Z7983 Long term (current) use of bisphosphonates: Secondary | ICD-10-CM | POA: Diagnosis not present

## 2019-05-15 HISTORY — PX: ABDOMINAL AORTOGRAM W/LOWER EXTREMITY: CATH118223

## 2019-05-15 LAB — POCT I-STAT, CHEM 8
BUN: 17 mg/dL (ref 8–23)
Calcium, Ion: 1.38 mmol/L (ref 1.15–1.40)
Chloride: 106 mmol/L (ref 98–111)
Creatinine, Ser: 1.1 mg/dL — ABNORMAL HIGH (ref 0.44–1.00)
Glucose, Bld: 96 mg/dL (ref 70–99)
HCT: 32 % — ABNORMAL LOW (ref 36.0–46.0)
Hemoglobin: 10.9 g/dL — ABNORMAL LOW (ref 12.0–15.0)
Potassium: 4.2 mmol/L (ref 3.5–5.1)
Sodium: 141 mmol/L (ref 135–145)
TCO2: 26 mmol/L (ref 22–32)

## 2019-05-15 SURGERY — ABDOMINAL AORTOGRAM W/LOWER EXTREMITY
Anesthesia: LOCAL | Laterality: Bilateral

## 2019-05-15 MED ORDER — FENTANYL CITRATE (PF) 100 MCG/2ML IJ SOLN
INTRAMUSCULAR | Status: AC
  Filled 2019-05-15: qty 2

## 2019-05-15 MED ORDER — HEPARIN (PORCINE) IN NACL 1000-0.9 UT/500ML-% IV SOLN
INTRAVENOUS | Status: AC
  Filled 2019-05-15: qty 1000

## 2019-05-15 MED ORDER — SODIUM CHLORIDE 0.9 % IV SOLN: 250.0000 mL | INTRAVENOUS | Status: DC | PRN

## 2019-05-15 MED ORDER — SODIUM CHLORIDE 0.9% FLUSH: 3.0000 mL | Freq: Two times a day (BID) | INTRAVENOUS | Status: DC

## 2019-05-15 MED ORDER — MORPHINE SULFATE (PF) 2 MG/ML IV SOLN: 2.0000 mg | INTRAVENOUS | Status: DC | PRN

## 2019-05-15 MED ORDER — MIDAZOLAM HCL 2 MG/2ML IJ SOLN
INTRAMUSCULAR | Status: AC
  Filled 2019-05-15: qty 2

## 2019-05-15 MED ORDER — ASPIRIN EC 81 MG PO TBEC: 81.0000 mg | DELAYED_RELEASE_TABLET | Freq: Every day | ORAL | Status: DC

## 2019-05-15 MED ORDER — HEPARIN (PORCINE) IN NACL 1000-0.9 UT/500ML-% IV SOLN
INTRAVENOUS | Status: DC | PRN
  Administered 2019-05-15 (×2): 500 mL

## 2019-05-15 MED ORDER — ACETAMINOPHEN 325 MG PO TABS: 650.0000 mg | ORAL_TABLET | ORAL | Status: DC | PRN

## 2019-05-15 MED ORDER — LIDOCAINE HCL (PF) 1 % IJ SOLN
INTRAMUSCULAR | Status: DC | PRN
  Administered 2019-05-15: 15 mL

## 2019-05-15 MED ORDER — IODIXANOL 320 MG/ML IV SOLN
INTRAVENOUS | Status: DC | PRN
  Administered 2019-05-15: 97 mL via INTRA_ARTERIAL

## 2019-05-15 MED ORDER — MIDAZOLAM HCL 2 MG/2ML IJ SOLN
INTRAMUSCULAR | Status: DC | PRN
  Administered 2019-05-15: 1 mg via INTRAVENOUS

## 2019-05-15 MED ORDER — ONDANSETRON HCL 4 MG/2ML IJ SOLN: 4.0000 mg | Freq: Four times a day (QID) | INTRAMUSCULAR | Status: DC | PRN

## 2019-05-15 MED ORDER — LIDOCAINE HCL (PF) 1 % IJ SOLN
INTRAMUSCULAR | Status: AC
  Filled 2019-05-15: qty 30

## 2019-05-15 MED ORDER — LABETALOL HCL 5 MG/ML IV SOLN: 10.0000 mg | INTRAVENOUS | Status: DC | PRN

## 2019-05-15 MED ORDER — SODIUM CHLORIDE 0.9 % WEIGHT BASED INFUSION: 1.0000 mL/kg/h | INTRAVENOUS | Status: DC

## 2019-05-15 MED ORDER — FENTANYL CITRATE (PF) 100 MCG/2ML IJ SOLN
INTRAMUSCULAR | Status: DC | PRN
  Administered 2019-05-15: 50 ug via INTRAVENOUS

## 2019-05-15 MED ORDER — SODIUM CHLORIDE 0.9 % IV SOLN: INTRAVENOUS | Status: DC

## 2019-05-15 MED ORDER — SODIUM CHLORIDE 0.9% FLUSH: 3.0000 mL | INTRAVENOUS | Status: DC | PRN

## 2019-05-15 MED ORDER — HYDRALAZINE HCL 20 MG/ML IJ SOLN: 5.0000 mg | INTRAMUSCULAR | Status: DC | PRN

## 2019-05-15 SURGICAL SUPPLY — 9 items
CATH OMNI FLUSH 5F 65CM (CATHETERS) ×2 IMPLANT
KIT MICROPUNCTURE NIT STIFF (SHEATH) ×2 IMPLANT
KIT PV (KITS) ×2 IMPLANT
SHEATH PINNACLE 5F 10CM (SHEATH) ×2 IMPLANT
SHEATH PROBE COVER 6X72 (BAG) ×2 IMPLANT
SYR MEDRAD MARK V 150ML (SYRINGE) ×2 IMPLANT
TRANSDUCER W/STOPCOCK (MISCELLANEOUS) ×2 IMPLANT
TRAY PV CATH (CUSTOM PROCEDURE TRAY) ×2 IMPLANT
WIRE BENTSON .035X145CM (WIRE) ×2 IMPLANT

## 2019-05-15 NOTE — Discharge Instructions (Signed)
Femoral Site Care This sheet gives you information about how to care for yourself after your procedure. Your health care provider may also give you more specific instructions. If you have problems or questions, contact your health care provider. What can I expect after the procedure? After the procedure, it is common to have:  Bruising that usually fades within 1-2 weeks.  Tenderness at the site. Follow these instructions at home: Wound care  Follow instructions from your health care provider about how to take care of your insertion site. Make sure you: ? Wash your hands with soap and water before you change your bandage (dressing). If soap and water are not available, use hand sanitizer. ? Change your dressing as told by your health care provider. ? Leave stitches (sutures), skin glue, or adhesive strips in place. These skin closures may need to stay in place for 2 weeks or longer. If adhesive strip edges start to loosen and curl up, you may trim the loose edges. Do not remove adhesive strips completely unless your health care provider tells you to do that.  Do not take baths, swim, or use a hot tub until your health care provider approves.  You may shower 24-48 hours after the procedure or as told by your health care provider. ? Gently wash the site with plain soap and water. ? Pat the area dry with a clean towel. ? Do not rub the site. This may cause bleeding.  Do not apply powder or lotion to the site. Keep the site clean and dry.  Check your femoral site every day for signs of infection. Check for: ? Redness, swelling, or pain. ? Fluid or blood. ? Warmth. ? Pus or a bad smell. Activity  For the first 2-3 days after your procedure, or as long as directed: ? Avoid climbing stairs as much as possible. ? Do not squat.  Do not lift anything that is heavier than 10 lb (4.5 kg), or the limit that you are told, until your health care provider says that it is safe.  Rest as  directed. ? Avoid sitting for a long time without moving. Get up to take short walks every 1-2 hours.  Do not drive for 24 hours if you were given a medicine to help you relax (sedative). General instructions  Take over-the-counter and prescription medicines only as told by your health care provider.  Keep all follow-up visits as told by your health care provider. This is important. Contact a health care provider if you have:  A fever or chills.  You have redness, swelling, or pain around your insertion site. Get help right away if:  The catheter insertion area swells very fast.  You pass out.  You suddenly start to sweat or your skin gets clammy.  The catheter insertion area is bleeding, and the bleeding does not stop when you hold steady pressure on the area.  The area near or just beyond the catheter insertion site becomes pale, cool, tingly, or numb. These symptoms may represent a serious problem that is an emergency. Do not wait to see if the symptoms will go away. Get medical help right away. Call your local emergency services (911 in the [place] Do not drive yourself to the hospital. Summary  After the procedure, it is common to have bruising that usually fades within 1-2 weeks.  Check your femoral site every day for signs of infection.  Do not lift anything that is heavier than 10 lb (4.5 kg), or the   limit that you are told, until your health care provider says that it is safe. This information is not intended to replace advice given to you by your health care provider. Make sure you discuss any questions you have with your health care provider. Document Revised: 01/17/2017 Document Reviewed: 01/17/2017 Elsevier Patient Education  2020 [employer]  

## 2019-05-15 NOTE — Progress Notes (Signed)
Site area: Left groin a 5 french arterial sheath was removed  Site Prior to Removal:  Level 0  Pressure Applied For 20 MINUTES    Bedrest Beginning at 0850am  Manual:   Yes.    Patient Status During Pull:  stable  Post Pull Groin Site:  Level 0  Post Pull Instructions Given:  Yes.    Post Pull Pulses Present:  Yes.    Dressing Applied:  Yes.    Comments:

## 2019-05-15 NOTE — Interval H&P Note (Signed)
History and Physical Interval Note:  05/15/2019 7:33 AM  Helen Washington  has presented today for surgery, with the diagnosis of pvd.  The various methods of treatment have been discussed with the patient and family. After consideration of risks, benefits and other options for treatment, the patient has consented to  Procedure(s): ABDOMINAL AORTOGRAM W/LOWER EXTREMITY (N/A) as a surgical intervention.  The patient's history has been reviewed, patient examined, no change in status, stable for surgery.  I have reviewed the patient's chart and labs.  Questions were answered to the patient's satisfaction.     [name]

## 2019-05-15 NOTE — Progress Notes (Signed)
Ambulated to bathroom to void and ambulated in hallway tolerated well, No bleeding or swelling noted before or after ambulation.

## 2019-05-15 NOTE — Progress Notes (Signed)
Discharge instructions reviewed with pt and her daughter (via telephone) Both voice understanding.  

## 2019-05-15 NOTE — Op Note (Signed)
    Patient name: Helen Washington MRN: [id number] DOB: December 20, 1945 Sex: female  05/15/2019 Pre-operative Diagnosis: claudication Post-operative diagnosis:  Same Surgeon:  [name] Procedure Performed:  1.  Ultrasound-guided access, left femoral artery  2.  Abdominal aortogram  3.  Bilateral lower extremity runoff  4.  Conscious sedation 20 minutes     Indications: The patient has developed progressive claudication right greater than left.  She is here for angiogram and possible invention.  Procedure:  The patient was identified in the holding area and taken to room 8.  The patient was then placed supine on the table and prepped and draped in the usual sterile fashion.  A time out was called.  Conscious sedation was administered with the use of IV fentanyl and Versed under continuous physician and nurse monitoring.  Heart rate, blood pressure, and oxygen saturation were continuously monitored.  Total sedation time was 20 minutes.  Ultrasound was used to evaluate the left common femoral artery.  It was patent .  A digital ultrasound image was acquired.  A micropuncture needle was used to access the left common femoral artery under ultrasound guidance.  An 018 wire was advanced without resistance and a micropuncture sheath was placed.  The 018 wire was removed and a benson wire was placed.  The micropuncture sheath was exchanged for a 5 french sheath.  An omniflush catheter was advanced over the wire to the level of L-1.  An abdominal angiogram was obtained.  Next, the catheter was brought down to the aortic bifurcation bilateral runoff was performed  Findings:   Aortogram: No significant renal artery stenosis was identified.  The infrarenal abdominal aorta is widely patent.  The right common external iliac artery widely patent.  The left common iliac is widely patent.  There is near occlusive disease within the left external iliac artery.  Right Lower Extremity: The right common femoral artery is  occluded.  There is reconstitution of the profunda and superficial femoral artery.  There is a 90% stenosis within the right popliteal artery.  There is two-vessel runoff via the peroneal and posterior tibial artery.  Left Lower Extremity: Left common femoral artery is occluded.  There is reconstitution of the profundofemoral and superficial femoral artery.  There is a greater than 90% stenosis within the left popliteal artery with two-vessel runoff via the peroneal and posterior tibial artery  Intervention: None  Impression:  #1  Bilateral common femoral artery occlusion  #2  Bilateral near occlusive popliteal stenosis  #3  The patient will be brought back for discussions regarding bilateral versus staged femoral endarterectomy   V. [name], M.D., [facility] [place] Office: [phone] Pager:  [phone]

## 2019-05-23 ENCOUNTER — Ambulatory Visit: Payer: Medicare HMO | Admitting: Adult Health

## 2019-05-25 ENCOUNTER — Telehealth (HOSPITAL_COMMUNITY): Payer: Self-pay

## 2019-05-25 NOTE — Telephone Encounter (Signed)

## 2019-05-28 ENCOUNTER — Other Ambulatory Visit: Payer: Self-pay

## 2019-05-28 ENCOUNTER — Ambulatory Visit (INDEPENDENT_AMBULATORY_CARE_PROVIDER_SITE_OTHER): Payer: Medicare HMO | Admitting: Surgery

## 2019-05-28 ENCOUNTER — Other Ambulatory Visit (HOSPITAL_COMMUNITY): Payer: Self-pay | Admitting: Emergency Medicine

## 2019-05-28 ENCOUNTER — Encounter: Payer: Self-pay | Admitting: Surgery

## 2019-05-28 VITALS — BP 154/76 | HR 68 | Temp 97.7°F | Resp 20 | Ht 60.0 in | Wt 120.0 lb

## 2019-05-28 DIAGNOSIS — I70213 Atherosclerosis of native arteries of extremities with intermittent claudication, bilateral legs: Secondary | ICD-10-CM

## 2019-05-28 NOTE — Progress Notes (Signed)
Vascular and Vein Specialist of [facility]  Patient name: Helen Washington MRN: [id number] DOB: [birth year] Sex: female   REASON FOR VISIT:    Follow up  [place]:    Helen Washington is a 74 y.o. female who I have been following for claudication.  Her symptoms have progressed to the point where she has difficulty doing any daily activities.  She underwent angiogram recently which revealed bilateral femoral occlusion.  She is here today to discuss surgical treatment.   The patient suffers from white coat hypertension.  she takes a statin for hypercholesterolemia. The patient is a former smoker   PAST MEDICAL HISTORY:   Past Medical History:  Diagnosis Date  . Hyperlipidemia   . Hypertension   . Insomnia      FAMILY HISTORY:   Family History  Problem Relation Age of Onset  . Stroke Mother   . Arthritis Mother   . CVA Mother   . Diverticulitis Mother   . Cancer Father   . Hypertension Sister   . Diverticulosis Sister   . Breast cancer Cousin   . Suicidality Brother   . Hypertension Brother   . Anxiety disorder Brother   . Kidney Stones Brother   . Ulcers Brother   . Healthy Daughter     SOCIAL HISTORY:   Social History   Tobacco Use  . Smoking status: Former Smoker    Packs/day: 1.00    Years: 36.00    Pack years: 36.00    Types: Cigarettes    Quit date: 04/01/2000    Years since quitting: 19.1  . Smokeless tobacco: Never Used  Substance Use Topics  . Alcohol use: No     ALLERGIES:   Allergies  Allergen Reactions  . Codeine Nausea Only     CURRENT MEDICATIONS:   Current Outpatient Medications  Medication Sig Dispense Refill  . alendronate (FOSAMAX) 70 MG tablet Take 70 mg by mouth once a week. Take with a full glass of water on an empty stomach.    . Aspirin-Caffeine 845-65 MG PACK Take 1 Package by mouth daily as needed (Headache).     . rosuvastatin (CRESTOR) 10 MG tablet Take 10 mg by mouth  daily.    . Vitamin D, Cholecalciferol, 400 units TABS Take by mouth.     No current facility-administered medications for this visit.    REVIEW OF SYSTEMS:   [X]  denotes positive finding, [ ]  denotes negative finding Cardiac  Comments:  Chest pain or chest pressure:    Shortness of breath upon exertion:    Short of breath when lying flat:    Irregular heart rhythm:        Vascular    Pain in calf, thigh, or hip brought on by ambulation: x   Pain in feet at night that wakes you up from your sleep:     Blood clot in your veins:    Leg swelling:         Pulmonary    Oxygen at home:    Productive cough:     Wheezing:         Neurologic    Sudden weakness in arms or legs:     Sudden numbness in arms or legs:     Sudden onset of difficulty speaking or slurred speech:    Temporary loss of vision in one eye:     Problems with dizziness:         Gastrointestinal    Blood in  stool:     Vomited blood:         Genitourinary    Burning when urinating:     Blood in urine:        Psychiatric    Major depression:         Hematologic    Bleeding problems:    Problems with blood clotting too easily:        Skin    Rashes or ulcers:        Constitutional    Fever or chills:      PHYSICAL EXAM:   There were no vitals filed for this visit.  GENERAL: The patient is a well-nourished female, in no acute distress. The vital signs are documented above. CARDIAC: There is a regular rate and rhythm.  VASCULAR: Nonpalpable pedal pulses PULMONARY: Non-labored respirations ABDOMEN: Soft and non-tender with normal pitched bowel sounds.  MUSCULOSKELETAL: There are no major deformities or cyanosis. NEUROLOGIC: No focal weakness or paresthesias are detected. SKIN: There are no ulcers or rashes noted. PSYCHIATRIC: The patient has a normal affect.  STUDIES:   None  MEDICAL ISSUES:   Bilateral severe claudication: We discussed that she has near total occlusion of bilateral common  femoral arteries as well as a high-grade left external iliac stenosis.  She will be scheduled for bilateral femoral endarterectomy with left iliac stent.  We discussed the risks and benefits of the operation as well as the recovery.  All of her questions were answered.  I will send her for cardiac clearance.  I will order a cardiac CTA.  Her procedure will be scheduled for late May    [name], IV, MD, FACS Vascular and Vein Specialists of [facility] [phone] Pager [phone]

## 2019-06-04 ENCOUNTER — Ambulatory Visit: Payer: Medicare HMO | Admitting: Cardiology

## 2019-06-04 ENCOUNTER — Other Ambulatory Visit: Payer: Self-pay

## 2019-06-04 ENCOUNTER — Encounter: Payer: Self-pay | Admitting: Cardiology

## 2019-06-04 VITALS — BP 142/76 | HR 77 | Temp 96.7°F | Ht 60.0 in | Wt 121.0 lb

## 2019-06-04 DIAGNOSIS — I70213 Atherosclerosis of native arteries of extremities with intermittent claudication, bilateral legs: Secondary | ICD-10-CM

## 2019-06-04 DIAGNOSIS — Z01818 Encounter for other preprocedural examination: Secondary | ICD-10-CM

## 2019-06-04 MED ORDER — METOPROLOL TARTRATE 50 MG PO TABS
ORAL_TABLET | ORAL | 0 refills | Status: DC
Start: 2019-06-04 — End: 2019-08-07

## 2019-06-04 MED ORDER — ASPIRIN EC 81 MG PO TBEC: 81.0000 mg | DELAYED_RELEASE_TABLET | Freq: Every day | ORAL | 3 refills | Status: AC

## 2019-06-04 NOTE — Patient Instructions (Addendum)
Medication Instructions:  START aspirin 81 mg daily  Take metoprolol tartrate (Lopressor) 50 mg TWO hours prior to CT  *If you need a refill on your cardiac medications before your next appointment, please call your pharmacy*  Follow-Up: At [facility], you and your health needs are our priority.  As part of our continuing mission to provide you with exceptional heart care, we have created designated Provider Care Teams.  These Care Teams include your primary Cardiologist (physician) and Advanced Practice Providers (APPs -  Physician Assistants and Nurse Practitioners) who all work together to provide you with the care you need, when you need it.  We recommend signing up for the patient portal called "MyChart".  Sign up information is provided on this After Visit Summary.  MyChart is used to connect with patients for Virtual Visits (Telemedicine).  Patients are able to view lab/test results, encounter notes, upcoming appointments, etc.  Non-urgent messages can be sent to your provider as well.   To learn more about what you can do with MyChart, go to [email or web address].    Your next appointment:   2 month(s)  The format for your next appointment:   In Person  Provider:   Oswaldo Milian, MD

## 2019-06-04 NOTE — Progress Notes (Signed)
Cardiology Office Note:    Date:  06/04/2019   ID:  Helen Washington, DOB [birth year], MRN [id number]  PCP:  Prince Solian, MD  Cardiologist:  No primary care provider on file.  Electrophysiologist:  None   Referring MD: Prince Solian, MD   Chief Complaint  Patient presents with  . Pre-op Exam    History of Present Illness:    Helen Washington is a 74 y.o. female with a hx of hypertension, hyperlipidemia, PAD who is referred by Dr. Trula Slade for preop evaluation prior to bilateral femoral endarterectomy.  She was seen by Dr. Trula Slade for evaluation of claudication.  Angiogram on 05/15/2019 showed bilateral femoral artery occlusion, bilateral near occlusive popliteal stenosis.  Reports she has pain in her legs with minimal exertion, which limits her activity.  Up until 1 month ago she was doing water aerobics 3 times per week.  Would be in the pool for 3 hours.  She denies any exertional chest pain or dyspnea.  She quit smoking in 2002, smoked 1 pack/day x 45 years.  Denies any history of heart disease in her immediate family.   Past Medical History:  Diagnosis Date  . Hyperlipidemia   . Hypertension   . Insomnia     Past Surgical History:  Procedure Laterality Date  . ABDOMINAL AORTOGRAM W/LOWER EXTREMITY Bilateral 05/15/2019   Procedure: ABDOMINAL AORTOGRAM W/LOWER EXTREMITY;  Surgeon: Serafina Mitchell, MD;  Location: [place] [facility];  Service: Cardiovascular;  Laterality: Bilateral;  . CATARACT EXTRACTION, BILATERAL  12/2015;01/2016   [facility]  . TUBAL LIGATION  1969    Current Medications: Current Meds  Medication Sig  . alendronate (FOSAMAX) 70 MG tablet Take 70 mg by mouth once a week. Take with a full glass of water on an empty stomach.  . Aspirin-Caffeine 845-65 MG PACK Take 1 Package by mouth daily as needed (Headache).   . rosuvastatin (CRESTOR) 10 MG tablet Take 10 mg by mouth daily.  . Vitamin D, Cholecalciferol, 400 units TABS Take by mouth.       Allergies:   Codeine   Social History   Socioeconomic History  . Marital status: Divorced    Spouse name: Not on file  . Number of children: 1  . Years of education: 68  . Highest education level: Not on file  Occupational History    Comment: retired Scientist, clinical (histocompatibility and immunogenetics), typist  Tobacco Use  . Smoking status: Former Smoker    Packs/day: 1.00    Years: 36.00    Pack years: 36.00    Types: Cigarettes    Quit date: 04/01/2000    Years since quitting: 19.1  . Smokeless tobacco: Never Used  Substance and Sexual Activity  . Alcohol use: No  . Drug use: No  . Sexual activity: Not on file  Other Topics Concern  . Not on file  Social History Narrative   Lives with room mate   Caffeine - coffee, 1 cup daily   Social Determinants of Health   Financial Resource Strain:   . Difficulty of Paying Living Expenses:   Food Insecurity:   . Worried About Charity fundraiser in the Last Year:   . Arboriculturist in the Last Year:   Transportation Needs:   . Film/video editor (Medical):   [name] Lack of Transportation (Non-Medical):   Physical Activity:   . Days of Exercise per Week:   . Minutes of Exercise per Session:   Stress:   . Feeling  of Stress :   Social Connections:   . Frequency of Communication with Friends and Family:   . Frequency of Social Gatherings with Friends and Family:   . Attends Religious Services:   . Active Member of Clubs or Organizations:   . Attends Archivist Meetings:   [name] Marital Status:      Family History: The patient's family history includes Anxiety disorder in her brother; Arthritis in her mother; Breast cancer in her cousin; CVA in her mother; Cancer in her father; Diverticulitis in her mother; Diverticulosis in her sister; Healthy in her daughter; Hypertension in her brother and sister; Kidney Stones in her brother; Stroke in her mother; Suicidality in her brother; Ulcers in her brother.  ROS:   Please see the history of present illness.      All other systems reviewed and are negative.  EKGs/Labs/Other Studies Reviewed:    The following studies were reviewed today:   EKG:  EKG is  ordered today.  The ekg ordered today demonstrates sinus rhythm, PVC, nonspecific T wave flattening, less than 1 mm ST depression in V6  Recent Labs: 05/15/2019: BUN 17; Creatinine, Ser 1.10; Hemoglobin 10.9; Potassium 4.2; Sodium 141  Recent Lipid Panel No results found for: CHOL, TRIG, HDL, CHOLHDL, VLDL, LDLCALC, LDLDIRECT  Physical Exam:    VS:  BP (!) 142/76   Pulse 77   Temp (!) 96.7 F (35.9 C)   Ht 5' (1.524 m)   Wt 121 lb (54.9 kg)   SpO2 94%   BMI 23.63 kg/m     Wt Readings from Last 3 Encounters:  06/04/19 121 lb (54.9 kg)  05/28/19 120 lb (54.4 kg)  05/15/19 120 lb (54.4 kg)     GEN:  in no acute distress HEENT: Normal NECK: No JVD; left carotid bruit CARDIAC: RRR, no murmurs, rubs, gallops RESPIRATORY:  Clear to auscultation without rales, wheezing or rhonchi  ABDOMEN: Soft, non-tender, non-distended MUSCULOSKELETAL:  No edema; No deformity  SKIN: Warm and dry NEUROLOGIC:  Alert and oriented x 3 PSYCHIATRIC:  Normal affect   ASSESSMENT:    1. Atherosclerosis of native artery of both lower extremities with intermittent claudication (HCC)    PLAN:    Preop evaluation: prior to bilateral femoral endarterectomy.  Good functional capacity, greater than 4 METS.  Denies any exertional chest pain or dyspnea.  Given high rate of silent coronary ischemia in patients with symptomatic PAD, coronary CTA ordered  PAD: Angiogram on 05/15/2019 showed bilateral femoral artery occlusion, bilateral near occlusive popliteal stenosis. Bilateral femoral endarterectomy planned.  Start aspirin 81 mg daily.  Patient is on Crestor 10 mg daily, will obtain results of recent lipid panel from PCP.   RTC in 2 months   Medication Adjustments/Labs and Tests Ordered: Current medicines are reviewed at length with the patient today.  Concerns  regarding medicines are outlined above.  Orders Placed This Encounter  Procedures  . Basic metabolic panel  . EKG 12-Lead   Meds ordered this encounter  Medications  . aspirin EC 81 MG tablet    Sig: Take 1 tablet (81 mg total) by mouth daily.    Dispense:  90 tablet    Refill:  3  . metoprolol tartrate (LOPRESSOR) 50 MG tablet    Sig: Take 2 hours prior to CT scan    Dispense:  1 tablet    Refill:  0    Patient Instructions  Medication Instructions:  START aspirin 81 mg daily  Take metoprolol tartrate (  Lopressor) 50 mg TWO hours prior to CT  *If you need a refill on your cardiac medications before your next appointment, please call your pharmacy*  Follow-Up: At [facility], you and your health needs are our priority.  As part of our continuing mission to provide you with exceptional heart care, we have created designated Provider Care Teams.  These Care Teams include your primary Cardiologist (physician) and Advanced Practice Providers (APPs -  Physician Assistants and Nurse Practitioners) who all work together to provide you with the care you need, when you need it.  We recommend signing up for the patient portal called "MyChart".  Sign up information is provided on this After Visit Summary.  MyChart is used to connect with patients for Virtual Visits (Telemedicine).  Patients are able to view lab/test results, encounter notes, upcoming appointments, etc.  Non-urgent messages can be sent to your provider as well.   To learn more about what you can do with MyChart, go to [email or web address].    Your next appointment:   2 month(s)  The format for your next appointment:   In Person  Provider:   Oswaldo Milian, MD         Signed, Donato Heinz, MD  06/04/2019 11:42 PM    [place]

## 2019-06-05 ENCOUNTER — Ambulatory Visit: Payer: Medicare HMO | Admitting: Adult Health

## 2019-06-05 LAB — BASIC METABOLIC PANEL
BUN/Creatinine Ratio: 15 (ref 12–28)
BUN: 16 mg/dL (ref 8–27)
CO2: 25 mmol/L (ref 20–29)
Calcium: 10.9 mg/dL — ABNORMAL HIGH (ref 8.7–10.3)
Chloride: 101 mmol/L (ref 96–106)
Creatinine, Ser: 1.09 mg/dL — ABNORMAL HIGH (ref 0.57–1.00)
GFR calc Af Amer: 58 mL/min/{1.73_m2} — ABNORMAL LOW (ref >59)
GFR calc non Af Amer: 50 mL/min/{1.73_m2} — ABNORMAL LOW (ref >59)
Glucose: 97 mg/dL (ref 65–99)
Potassium: 5.1 mmol/L (ref 3.5–5.2)
Sodium: 139 mmol/L (ref 134–144)

## 2019-06-11 ENCOUNTER — Ambulatory Visit: Payer: Medicare HMO | Admitting: Cardiology

## 2019-06-12 DIAGNOSIS — R69 Illness, unspecified: Secondary | ICD-10-CM | POA: Diagnosis not present

## 2019-06-14 DIAGNOSIS — G4733 Obstructive sleep apnea (adult) (pediatric): Secondary | ICD-10-CM | POA: Diagnosis not present

## 2019-06-27 ENCOUNTER — Telehealth (HOSPITAL_COMMUNITY): Payer: Self-pay | Admitting: *Deleted

## 2019-06-27 NOTE — Telephone Encounter (Signed)

## 2019-06-29 ENCOUNTER — Ambulatory Visit (HOSPITAL_COMMUNITY)
Admission: RE | Admit: 2019-06-29 | Discharge: 2019-06-29 | Disposition: A | Discharge status: Home / Self Care | Payer: Medicare HMO | Source: Ambulatory Visit | Attending: Surgery | Admitting: Surgery

## 2019-06-29 DIAGNOSIS — I70213 Atherosclerosis of native arteries of extremities with intermittent claudication, bilateral legs: Secondary | ICD-10-CM | POA: Insufficient documentation

## 2019-06-29 DIAGNOSIS — I2510 Atherosclerotic heart disease of native coronary artery without angina pectoris: Secondary | ICD-10-CM | POA: Insufficient documentation

## 2019-06-29 DIAGNOSIS — I700 Atherosclerosis of aorta: Secondary | ICD-10-CM | POA: Diagnosis not present

## 2019-06-29 MED ORDER — IOHEXOL 350 MG/ML SOLN
80.0000 mL | Freq: Once | INTRAVENOUS | Status: AC | PRN
  Administered 2019-06-29: 80 mL via INTRAVENOUS

## 2019-06-29 MED ORDER — NITROGLYCERIN 0.4 MG SL SUBL
0.8000 mg | SUBLINGUAL_TABLET | Freq: Once | SUBLINGUAL | Status: AC
  Administered 2019-06-29: 0.8 mg via SUBLINGUAL

## 2019-06-29 MED ORDER — NITROGLYCERIN 0.4 MG SL SUBL
SUBLINGUAL_TABLET | SUBLINGUAL | Status: AC
  Filled 2019-06-29: qty 2

## 2019-06-30 ENCOUNTER — Ambulatory Visit (HOSPITAL_COMMUNITY)
Admission: RE | Admit: 2019-06-30 | Discharge: 2019-06-30 | Disposition: A | Discharge status: Home / Self Care | Payer: Medicare HMO | Source: Ambulatory Visit | Attending: Surgery | Admitting: Surgery

## 2019-06-30 DIAGNOSIS — I2510 Atherosclerotic heart disease of native coronary artery without angina pectoris: Secondary | ICD-10-CM | POA: Diagnosis not present

## 2019-06-30 DIAGNOSIS — I70213 Atherosclerosis of native arteries of extremities with intermittent claudication, bilateral legs: Secondary | ICD-10-CM | POA: Insufficient documentation

## 2019-07-11 ENCOUNTER — Telehealth: Payer: Self-pay | Admitting: *Deleted

## 2019-07-11 NOTE — Telephone Encounter (Signed)
Spoke to patient, scheduled to see Dr. Gardiner Rhyme Friday 6/25 to discuss coronary CTA

## 2019-07-11 NOTE — Telephone Encounter (Deleted)
-----   Message from Howie Ill sent at 06/07/2019  7:56 AM EDT ----- Regarding: RE: CTA Vince Ainsley  Its pending review through [place]. I will let you know as soon as I hear anything.    Romie Minus  ----- Message ----- From: Silverio Lay, RN Sent: 06/06/2019   4:57 PM EDT To: Silverio Lay, RN, Howie Ill Subject: CTA                                            Hello,  This is a vascular patient that they ordered a coronary CTA on.  Any update?    Are we still waiting on precert?  Thank you!

## 2019-07-11 NOTE — Telephone Encounter (Signed)
CTA completed-patient needs OV per Dr. Gardiner Rhyme

## 2019-07-13 ENCOUNTER — Ambulatory Visit: Payer: Medicare HMO | Admitting: Cardiology

## 2019-07-13 ENCOUNTER — Encounter: Payer: Self-pay | Admitting: Cardiology

## 2019-07-13 ENCOUNTER — Other Ambulatory Visit: Payer: Self-pay

## 2019-07-13 VITALS — BP 148/74 | HR 75 | Ht 60.0 in | Wt 121.2 lb

## 2019-07-13 DIAGNOSIS — I10 Essential (primary) hypertension: Secondary | ICD-10-CM | POA: Diagnosis not present

## 2019-07-13 DIAGNOSIS — Z01818 Encounter for other preprocedural examination: Secondary | ICD-10-CM

## 2019-07-13 DIAGNOSIS — I2510 Atherosclerotic heart disease of native coronary artery without angina pectoris: Secondary | ICD-10-CM | POA: Diagnosis not present

## 2019-07-13 DIAGNOSIS — E785 Hyperlipidemia, unspecified: Secondary | ICD-10-CM | POA: Diagnosis not present

## 2019-07-13 DIAGNOSIS — I739 Peripheral vascular disease, unspecified: Secondary | ICD-10-CM

## 2019-07-13 MED ORDER — ROSUVASTATIN CALCIUM 20 MG PO TABS
20.0000 mg | ORAL_TABLET | Freq: Every day | ORAL | 3 refills | Status: DC
Start: 2019-07-13 — End: 2020-06-30

## 2019-07-13 NOTE — Progress Notes (Signed)
Cardiology Office Note:    Date:  07/14/2019   ID:  Helen Washington, DOB April 24, 1945, MRN [id number]  PCP:  Helen Solian, MD  Cardiologist:  No primary care provider on file.  Electrophysiologist:  None   Referring MD: Helen Solian, MD   Chief Complaint  Patient presents with  . Pre-op Exam    History of Present Illness:    Helen Washington is a 74 y.o. female with a hx of hypertension, hyperlipidemia, PAD who presents for follow-up.  She was referred by Dr. Trula Slade for preop evaluation prior to bilateral femoral endarterectomy.  She was seen by Dr. Trula Slade for evaluation of claudication.  Angiogram on 05/15/2019 showed bilateral femoral artery occlusion, bilateral near occlusive popliteal stenosis.  Reports she has pain in her legs with minimal exertion, which limits her activity.  Up until 1 month ago she was doing water aerobics 3 times per week.  Would be in the pool for 3 hours.  She denies any exertional chest pain or dyspnea.  She quit smoking in 2002, smoked 1 pack/day x 45 years.  Denies any history of heart disease in her immediate family.  Coronary CTA on 06/30/2019 showed calcium score 1503 (97 percentile).  CT FFR showed severe stenosis in small nondominant circumflex artery (was read as occluded mid LAD and mid RCA on FFR but reviewed with Dr Meda Coffee and appears to not be occluded but rather not modeled on FFR due to small vessel size <1.51mm).  Since last clinic visit, she reports that she has been doing well. Denies any chest pain or dyspnea. Continues to have pain in her leg with walking. Denies any lightheadedness, syncope, or palpitations.   Past Medical History:  Diagnosis Date  . Hyperlipidemia   . Hypertension   . Insomnia     Past Surgical History:  Procedure Laterality Date  . ABDOMINAL AORTOGRAM W/LOWER EXTREMITY Bilateral 05/15/2019   Procedure: ABDOMINAL AORTOGRAM W/LOWER EXTREMITY;  Surgeon: Serafina Mitchell, MD;  Location: [place] [facility];  Service:  Cardiovascular;  Laterality: Bilateral;  . CATARACT EXTRACTION, BILATERAL  12/2015;01/2016   [facility] [place] [facility]  . TUBAL LIGATION  1969    Current Medications: Current Meds  Medication Sig  . alendronate (FOSAMAX) 70 MG tablet Take 70 mg by mouth once a week. Take with a full glass of water on an empty stomach.  [name] aspirin EC 81 MG tablet Take 1 tablet (81 mg total) by mouth daily.  . Aspirin-Caffeine 845-65 MG PACK Take 1 Package by mouth daily as needed (Headache).   . metoprolol tartrate (LOPRESSOR) 50 MG tablet Take 2 hours prior to CT scan  . Vitamin D, Cholecalciferol, 400 units TABS Take by mouth.  . [DISCONTINUED] rosuvastatin (CRESTOR) 10 MG tablet Take 10 mg by mouth daily.     Allergies:   Codeine   Social History   Socioeconomic History  . Marital status: Divorced    Spouse name: Not on file  . Number of children: 1  . Years of education: 70  . Highest education level: Not on file  Occupational History    Comment: retired Scientist, clinical (histocompatibility and immunogenetics), typist  Tobacco Use  . Smoking status: Former Smoker    Packs/day: 1.00    Years: 36.00    Pack years: 36.00    Types: Cigarettes    Quit date: 04/01/2000    Years since quitting: 19.2  . Smokeless tobacco: Never Used  Vaping Use  . Vaping Use: Never used  Substance and Sexual Activity  .  Alcohol use: No  . Drug use: No  . Sexual activity: Not on file  Other Topics Concern  . Not on file  Social History Narrative   Lives with room mate   Caffeine - coffee, 1 cup daily   Social Determinants of Health   Financial Resource Strain:   . Difficulty of Paying Living Expenses:   Food Insecurity:   . Worried About Charity fundraiser in the Last Year:   . Arboriculturist in the Last Year:   Transportation Needs:   . Film/video editor (Medical):   [name] Lack of Transportation (Non-Medical):   Physical Activity:   . Days of Exercise per Week:   . Minutes of Exercise per Session:   Stress:   . Feeling of Stress :     Social Connections:   . Frequency of Communication with Friends and Family:   . Frequency of Social Gatherings with Friends and Family:   . Attends Religious Services:   . Active Member of Clubs or Organizations:   . Attends Archivist Meetings:   [name] Marital Status:      Family History: The patient's family history includes Anxiety disorder in her brother; Arthritis in her mother; Breast cancer in her cousin; CVA in her mother; Cancer in her father; Diverticulitis in her mother; Diverticulosis in her sister; Healthy in her daughter; Hypertension in her brother and sister; Kidney Stones in her brother; Stroke in her mother; Suicidality in her brother; Ulcers in her brother.  ROS:   Please see the history of present illness.     All other systems reviewed and are negative.  EKGs/Labs/Other Studies Reviewed:    The following studies were reviewed today:   EKG:  EKG is  ordered today.  The ekg ordered today demonstrates sinus rhythm, PVC, nonspecific T wave flattening, less than 1 mm ST depression in V6  Recent Labs: 05/15/2019: Hemoglobin 10.9 06/04/2019: BUN 16; Creatinine, Ser 1.09; Potassium 5.1; Sodium 139  Recent Lipid Panel No results found for: CHOL, TRIG, HDL, CHOLHDL, VLDL, LDLCALC, LDLDIRECT  Physical Exam:    VS:  BP (!) 148/74   Pulse 75   Ht 5' (1.524 m)   Wt 121 lb 3.2 oz (55 kg)   SpO2 94%   BMI 23.67 kg/m     Wt Readings from Last 3 Encounters:  07/13/19 121 lb 3.2 oz (55 kg)  06/04/19 121 lb (54.9 kg)  05/28/19 120 lb (54.4 kg)     GEN:  in no acute distress HEENT: Normal NECK: No JVD; left carotid bruit CARDIAC: RRR, no murmurs, rubs, gallops RESPIRATORY:  Clear to auscultation without rales, wheezing or rhonchi  ABDOMEN: Soft, non-tender, non-distended MUSCULOSKELETAL:  No edema; No deformity  SKIN: Warm and dry NEUROLOGIC:  Alert and oriented x 3 PSYCHIATRIC:  Normal affect   ASSESSMENT:    1. Pre-op evaluation   2. Coronary artery  disease involving native coronary artery of native heart without angina pectoris   3. PAD (peripheral artery disease) ([place])   4. Essential hypertension   5. Hyperlipidemia, unspecified hyperlipidemia type    PLAN:    Preop evaluation: prior to bilateral femoral endarterectomy.  Good functional capacity, greater than 4 METS.  Denies any exertional chest pain or dyspnea.  Given high rate of silent coronary ischemia in patients with symptomatic PAD, coronary CTA ordered.  Coronary CTA on 06/30/2019 showed calcium score 1503 (97 percentile).  CT FFR showed severe stenosis in small nondominant circumflex  artery (was read as occluded mid LAD and mid RCA on FFR but reviewed with Dr Meda Coffee and appears to not be occluded but rather not modeled on FFR due to small vessel size <1.25mm). - Given patient is asymptomatic and only obstructive lesion seen on CT FFR is in mid LCX (small and nondominant), no further cardiac work-up recommended prior to her surgery.  Coronary artery disease:Coronary CTA on 06/30/2019 showed calcium score 1503 (97 percentile).  CT FFR showed severe stenosis in small nondominant circumflex artery (was read as occluded mid LAD and mid RCA on FFR but reviewed with Dr Meda Coffee and appears to not be occluded but rather not modeled on FFR due to small vessel size <1.41mm). Denies any anginal symptoms. -Continue aspirin 81 mg daily -Recent lipid panel showed LDL 83. Will increase rosuvastatin to 20 mg daily for goal LDL less than 70.  PAD: Angiogram on 05/15/2019 showed bilateral femoral artery occlusion, bilateral near occlusive popliteal stenosis. Bilateral femoral endarterectomy planned. Continue aspirin and rosuvastatin.  Hypertension: BP elevated in clinic today, she reports has been well controlled at home. Asked to check BP daily for next 2 weeks and call with results.  RTC in 3 months   Medication Adjustments/Labs and Tests Ordered: Current medicines are reviewed at length with the  patient today.  Concerns regarding medicines are outlined above.  No orders of the defined types were placed in this encounter.  Meds ordered this encounter  Medications  . rosuvastatin (CRESTOR) 20 MG tablet    Sig: Take 1 tablet (20 mg total) by mouth daily.    Dispense:  90 tablet    Refill:  3    Patient Instructions  Medication Instructions:  Increase Crestor to 20 mg daily Continue all other medications *If you need a refill on your cardiac medications before your next appointment, please call your pharmacy*   Lab Work: None ordered    Testing/Procedures: None ordered   Follow-Up: At [facility], you and your health needs are our priority.  As part of our continuing mission to provide you with exceptional heart care, we have created designated Provider Care Teams.  These Care Teams include your primary Cardiologist (physician) and Advanced Practice Providers (APPs -  Physician Assistants and Nurse Practitioners) who all work together to provide you with the care you need, when you need it.  We recommend signing up for the patient portal called "MyChart".  Sign up information is provided on this After Visit Summary.  MyChart is used to connect with patients for Virtual Visits (Telemedicine).  Patients are able to view lab/test results, encounter notes, upcoming appointments, etc.  Non-urgent messages can be sent to your provider as well.   To learn more about what you can do with MyChart, go to [email or web address].    Your next appointment:  Tuesday 7/20 at 9:00 am   The format for your next appointment: Office     Provider:  Dr.Darrall Strey    Check B/P twice a day for 1 week.  Call office to report readings    Signed, Donato Heinz, MD  07/14/2019 2:15 PM    [facility]

## 2019-07-13 NOTE — Patient Instructions (Addendum)
Medication Instructions:  Increase Crestor to 20 mg daily Continue all other medications *If you need a refill on your cardiac medications before your next appointment, please call your pharmacy*   Lab Work: None ordered    Testing/Procedures: None ordered   Follow-Up: At [facility], you and your health needs are our priority.  As part of our continuing mission to provide you with exceptional heart care, we have created designated Provider Care Teams.  These Care Teams include your primary Cardiologist (physician) and Advanced Practice Providers (APPs -  Physician Assistants and Nurse Practitioners) who all work together to provide you with the care you need, when you need it.  We recommend signing up for the patient portal called "MyChart".  Sign up information is provided on this After Visit Summary.  MyChart is used to connect with patients for Virtual Visits (Telemedicine).  Patients are able to view lab/test results, encounter notes, upcoming appointments, etc.  Non-urgent messages can be sent to your provider as well.   To learn more about what you can do with MyChart, go to [email or web address].    Your next appointment:  Tuesday 7/20 at 9:00 am   The format for your next appointment: Office     Provider:  Dr.Schumann    Check B/P twice a day for 1 week.  Call office to report readings

## 2019-07-15 DIAGNOSIS — G4733 Obstructive sleep apnea (adult) (pediatric): Secondary | ICD-10-CM | POA: Diagnosis not present

## 2019-07-20 ENCOUNTER — Telehealth: Payer: Self-pay | Admitting: Cardiology

## 2019-07-20 NOTE — Telephone Encounter (Signed)
BP readings provided per request of MD after 07/13/19 visit  Routed to MD/RN

## 2019-07-20 NOTE — Telephone Encounter (Signed)
New message   B/p readings:   07/13/2019 124/59 07/14/2019 120/63 07/15/2019 119/54  146/62  07/16/2019 133/57  134/72   07/17/2019 134/58   06/30/2021169/55  139/66 07/19/2019 138/68   122/51  139/63

## 2019-07-23 NOTE — Telephone Encounter (Signed)
BP intermittently elevated, recommend starting medication.  Recommend starting amlodipine 5 mg daily.  Would check BP daily for next 2 weeks and call with results.  Would also let patient know that I discussed her CT scan with Dr. Meda Coffee who read it and there were no high risk findings.  She can proceed with her surgery as planned.  No further cardiac work-up recommended prior to her surgery.

## 2019-07-26 MED ORDER — AMLODIPINE BESYLATE 5 MG PO TABS
5.0000 mg | ORAL_TABLET | Freq: Every day | ORAL | 3 refills | Status: DC
Start: 2019-07-26 — End: 2019-08-07

## 2019-07-26 NOTE — Telephone Encounter (Signed)
Spoke to patient, aware of recommendations and verbalized understanding.   rx sent to pharmacy.  She will continue to keep a bp log and bring to next appt on 7/20.     She also was wondering if she is cleared for her vascular surgery.  Advised per Dr. Gardiner Rhyme, she is cleared and no further workup needed.     Patient aware and verbalized understanding.

## 2019-08-07 ENCOUNTER — Ambulatory Visit: Payer: Medicare HMO | Admitting: Cardiology

## 2019-08-07 ENCOUNTER — Encounter: Payer: Self-pay | Admitting: Cardiology

## 2019-08-07 ENCOUNTER — Other Ambulatory Visit: Payer: Self-pay

## 2019-08-07 VITALS — BP 140/80 | HR 102 | Ht 59.0 in | Wt 121.0 lb

## 2019-08-07 DIAGNOSIS — Z01818 Encounter for other preprocedural examination: Secondary | ICD-10-CM

## 2019-08-07 DIAGNOSIS — I739 Peripheral vascular disease, unspecified: Secondary | ICD-10-CM

## 2019-08-07 DIAGNOSIS — I10 Essential (primary) hypertension: Secondary | ICD-10-CM | POA: Diagnosis not present

## 2019-08-07 DIAGNOSIS — I2510 Atherosclerotic heart disease of native coronary artery without angina pectoris: Secondary | ICD-10-CM | POA: Diagnosis not present

## 2019-08-07 MED ORDER — AMLODIPINE BESYLATE 10 MG PO TABS: 10.0000 mg | ORAL_TABLET | Freq: Every day | ORAL | 3 refills | Status: DC

## 2019-08-07 NOTE — Patient Instructions (Signed)
Medication Instructions:  INCREASE amlodipine to 10 mg daily  *If you need a refill on your cardiac medications before your next appointment, please call your pharmacy*  Testing/Procedures: Your physician has requested that you have a lexiscan myoview. For further information please visit [email or web address]. Please follow instruction sheet, as given.  Your physician has requested that you have an echocardiogram. Echocardiography is a painless test that uses sound waves to create images of your heart. It provides your doctor with information about the size and shape of your heart and how well your heart's chambers and valves are working. This procedure takes approximately one hour. There are no restrictions for this procedure.   Follow-Up: At [facility], you and your health needs are our priority.  As part of our continuing mission to provide you with exceptional heart care, we have created designated Provider Care Teams.  These Care Teams include your primary Cardiologist (physician) and Advanced Practice Providers (APPs -  Physician Assistants and Nurse Practitioners) who all work together to provide you with the care you need, when you need it.  We recommend signing up for the patient portal called "MyChart".  Sign up information is provided on this After Visit Summary.  MyChart is used to connect with patients for Virtual Visits (Telemedicine).  Patients are able to view lab/test results, encounter notes, upcoming appointments, etc.  Non-urgent messages can be sent to your provider as well.   To learn more about what you can do with MyChart, go to [email or web address].    Your next appointment:   3 month(s)  The format for your next appointment:   In Person  Provider:   Oswaldo Milian, MD   Other Instructions Please check your blood pressure at home daily, write it down.  Call the office or send message via Mychart with the readings in 2 weeks for Dr. Gardiner Rhyme to review.

## 2019-08-07 NOTE — Progress Notes (Signed)
Cardiology Office Note:    Date:  08/10/2019   ID:  Helen Washington, DOB 10/01/1945, MRN [id number]  PCP:  Prince Solian, MD  Cardiologist:  No primary care provider on file.  Electrophysiologist:  None   Referring MD: Prince Solian, MD   Chief Complaint  Patient presents with  . Follow-up    2 months  . Coronary Artery Disease    History of Present Illness:    Helen Washington is a 74 y.o. female with a hx of hypertension, hyperlipidemia, PAD who presents for follow-up.  She was referred by Dr. Trula Slade for preop evaluation prior to bilateral femoral endarterectomy.  She was seen by Dr. Trula Slade for evaluation of claudication.  Angiogram on 05/15/2019 showed bilateral femoral artery occlusion, bilateral near occlusive popliteal stenosis.  Reports she has pain in her legs with minimal exertion, which limits her activity.  Up until 1 month ago she was doing water aerobics 3 times per week.  Would be in the pool for 3 hours.  She denies any exertional chest pain or dyspnea.  She quit smoking in 2002, smoked 1 pack/day x 45 years.  Denies any history of heart disease in her immediate family.  Coronary CTA on 06/30/2019 showed calcium score 1503 (97 percentile).  CT FFR showed severe stenosis in small nondominant circumflex artery (was read as occluded mid LAD and mid RCA on FFR but reviewed with Dr Meda Coffee and appears to not be occluded but rather not modeled on FFR due to small vessel size <1.52mm).  Since last clinic visit, she reports that she has been doing well.  She denies any chest pain or dyspnea.  Does report she had an episode of dizziness a few days ago, denies any syncopal episodes.  Denies any palpitations.  Has been checking her BP at home, has been 110s to 150s over 50s to 60s.     Past Medical History:  Diagnosis Date  . Hyperlipidemia   . Hypertension   . Insomnia     Past Surgical History:  Procedure Laterality Date  . ABDOMINAL AORTOGRAM W/LOWER EXTREMITY Bilateral  05/15/2019   Procedure: ABDOMINAL AORTOGRAM W/LOWER EXTREMITY;  Surgeon: Serafina Mitchell, MD;  Location: [facility];  Service: Cardiovascular;  Laterality: Bilateral;  . CATARACT EXTRACTION, BILATERAL  12/2015;01/2016   [facility]  . TUBAL LIGATION  1969    Current Medications: Current Meds  Medication Sig  . alendronate (FOSAMAX) 70 MG tablet Take 70 mg by mouth once a week. Take with a full glass of water on an empty stomach.  [name] amLODipine (NORVASC) 10 MG tablet Take 1 tablet (10 mg total) by mouth daily.  [name] aspirin EC 81 MG tablet Take 1 tablet (81 mg total) by mouth daily.  . Aspirin-Caffeine 845-65 MG PACK Take 1 Package by mouth daily as needed (Headache).   . rosuvastatin (CRESTOR) 20 MG tablet Take 1 tablet (20 mg total) by mouth daily.  . Vitamin D, Cholecalciferol, 400 units TABS Take by mouth.  . [DISCONTINUED] amLODipine (NORVASC) 5 MG tablet Take 1 tablet (5 mg total) by mouth daily.  . [DISCONTINUED] metoprolol tartrate (LOPRESSOR) 50 MG tablet Take 2 hours prior to CT scan     Allergies:   Codeine   Social History   Socioeconomic History  . Marital status: Divorced    Spouse name: Not on file  . Number of children: 1  . Years of education: 21  . Highest education level: Not on file  Occupational  History    Comment: retired Scientist, clinical (histocompatibility and immunogenetics), Sport and exercise psychologist  Tobacco Use  . Smoking status: Former Smoker    Packs/day: 1.00    Years: 36.00    Pack years: 36.00    Types: Cigarettes    Quit date: 04/01/2000    Years since quitting: 19.3  . Smokeless tobacco: Never Used  Vaping Use  . Vaping Use: Never used  Substance and Sexual Activity  . Alcohol use: No  . Drug use: No  . Sexual activity: Not on file  Other Topics Concern  . Not on file  Social History Narrative   Lives with room mate   Caffeine - coffee, 1 cup daily   Social Determinants of Health   Financial Resource Strain:   . Difficulty of Paying Living Expenses:   Food Insecurity:     . Worried About Charity fundraiser in the Last Year:   . Arboriculturist in the Last Year:   Transportation Needs:   . Film/video editor (Medical):   [name] Lack of Transportation (Non-Medical):   Physical Activity:   . Days of Exercise per Week:   . Minutes of Exercise per Session:   Stress:   . Feeling of Stress :   Social Connections:   . Frequency of Communication with Friends and Family:   . Frequency of Social Gatherings with Friends and Family:   . Attends Religious Services:   . Active Member of Clubs or Organizations:   . Attends Archivist Meetings:   [name] Marital Status:      Family History: The patient's family history includes Anxiety disorder in her brother; Arthritis in her mother; Breast cancer in her cousin; CVA in her mother; Cancer in her father; Diverticulitis in her mother; Diverticulosis in her sister; Healthy in her daughter; Hypertension in her brother and sister; Kidney Stones in her brother; Stroke in her mother; Suicidality in her brother; Ulcers in her brother.  ROS:   Please see the history of present illness.     All other systems reviewed and are negative.  EKGs/Labs/Other Studies Reviewed:    The following studies were reviewed today:   EKG:  EKG is  not ordered today.  The ekg ordered most recently demonstrates sinus rhythm, PVC, nonspecific T wave flattening, less than 1 mm ST depression in V6  Recent Labs: 05/15/2019: Hemoglobin 10.9 06/04/2019: BUN 16; Creatinine, Ser 1.09; Potassium 5.1; Sodium 139  Recent Lipid Panel No results found for: CHOL, TRIG, HDL, CHOLHDL, VLDL, LDLCALC, LDLDIRECT  Physical Exam:    VS:  BP 140/80 (BP Location: Right Arm, Patient Position: Sitting, Cuff Size: Normal)   Pulse (!) 102   Ht 4\' 11"  (1.499 m)   Wt 121 lb (54.9 kg)   SpO2 95%   BMI 24.44 kg/m     Wt Readings from Last 3 Encounters:  08/07/19 121 lb (54.9 kg)  07/13/19 121 lb 3.2 oz (55 kg)  06/04/19 121 lb (54.9 kg)     GEN:  in no  acute distress HEENT: Normal NECK: No JVD; left carotid bruit CARDIAC: RRR, no murmurs, rubs, gallops RESPIRATORY:  Clear to auscultation without rales, wheezing or rhonchi  ABDOMEN: Soft, non-tender, non-distended MUSCULOSKELETAL:  No edema; No deformity  SKIN: Warm and dry NEUROLOGIC:  Alert and oriented x 3 PSYCHIATRIC:  Normal affect   ASSESSMENT:    1. Pre-op evaluation   2. CAD in native artery   3. PAD (peripheral artery disease) ([place])   4. Essential  hypertension    PLAN:    Preop evaluation: prior to bilateral femoral endarterectomy.  Good functional capacity, greater than 4 METS.  Denies any exertional chest pain or dyspnea.  Given high rate of silent coronary ischemia in patients with symptomatic PAD, coronary CTA ordered.  Coronary CTA on 06/30/2019 showed calcium score 1503 (97 percentile).  CT FFR showed severe stenosis in small nondominant circumflex artery (was read as occluded mid LAD and mid RCA on FFR but reviewed with Dr Meda Coffee and appears to not be occluded but rather not modeled on FFR due to small vessel size <1.30mm). -Will check Lexiscan Myoview to ensure no significant ischemia.  Provided study is not high risk, no further cardiac work-up recommended prior to surgery  Coronary artery disease:Coronary CTA on 06/30/2019 showed calcium score 1503 (97 percentile).  CT FFR showed severe stenosis in small nondominant circumflex artery (was read as occluded mid LAD and mid RCA on FFR but reviewed with Dr Meda Coffee and appears to not be occluded but rather not modeled on FFR due to small vessel size <1.80mm). Denies any anginal symptoms. -Continue aspirin 81 mg daily -Recent lipid panel showed LDL 83.  Increased rosuvastatin to 20 mg daily for goal LDL less than 70. -Will check echocardiogram -Lexiscan Myoview as above  PAD: Angiogram on 05/15/2019 showed bilateral femoral artery occlusion, bilateral near occlusive popliteal stenosis. Bilateral femoral endarterectomy planned.  Continue aspirin and rosuvastatin.  Hypertension: Started on amlodipine 5 mg daily at recent clinic visit.  BP appears improved, but mildly elevated.  Will increase amlodipine to 10 mg daily.  Asked patient to check BP daily for next 2 weeks and call with results.  RTC in 3 months   Medication Adjustments/Labs and Tests Ordered: Current medicines are reviewed at length with the patient today.  Concerns regarding medicines are outlined above.  Orders Placed This Encounter  Procedures  . MYOCARDIAL PERFUSION IMAGING  . ECHOCARDIOGRAM COMPLETE   Meds ordered this encounter  Medications  . amLODipine (NORVASC) 10 MG tablet    Sig: Take 1 tablet (10 mg total) by mouth daily.    Dispense:  30 tablet    Refill:  3    Dose increase    Patient Instructions  Medication Instructions:  INCREASE amlodipine to 10 mg daily  *If you need a refill on your cardiac medications before your next appointment, please call your pharmacy*  Testing/Procedures: Your physician has requested that you have a lexiscan myoview. For further information please visit [email or web address]. Please follow instruction sheet, as given.  Your physician has requested that you have an echocardiogram. Echocardiography is a painless test that uses sound waves to create images of your heart. It provides your doctor with information about the size and shape of your heart and how well your heart's chambers and valves are working. This procedure takes approximately one hour. There are no restrictions for this procedure.   Follow-Up: At [facility], you and your health needs are our priority.  As part of our continuing mission to provide you with exceptional heart care, we have created designated Provider Care Teams.  These Care Teams include your primary Cardiologist (physician) and Advanced Practice Providers (APPs -  Physician Assistants and Nurse Practitioners) who all work together to provide you with the care you need,  when you need it.  We recommend signing up for the patient portal called "MyChart".  Sign up information is provided on this After Visit Summary.  MyChart is used to connect with patients for  Virtual Visits (Telemedicine).  Patients are able to view lab/test results, encounter notes, upcoming appointments, etc.  Non-urgent messages can be sent to your provider as well.   To learn more about what you can do with MyChart, go to [email or web address].    Your next appointment:   3 month(s)  The format for your next appointment:   In Person  Provider:   Oswaldo Milian, MD   Other Instructions Please check your blood pressure at home daily, write it down.  Call the office or send message via Mychart with the readings in 2 weeks for Dr. Gardiner Rhyme to review.       Signed, Donato Heinz, MD  08/10/2019 4:31 PM    [facility]

## 2019-08-14 DIAGNOSIS — G4733 Obstructive sleep apnea (adult) (pediatric): Secondary | ICD-10-CM | POA: Diagnosis not present

## 2019-08-22 ENCOUNTER — Telehealth (HOSPITAL_COMMUNITY): Payer: Self-pay | Admitting: *Deleted

## 2019-08-22 ENCOUNTER — Encounter: Payer: Self-pay | Admitting: Adult Health

## 2019-08-22 NOTE — Telephone Encounter (Signed)
Close encounter 

## 2019-08-24 ENCOUNTER — Ambulatory Visit (HOSPITAL_COMMUNITY)
Admission: RE | Admit: 2019-08-24 | Discharge: 2019-08-24 | Disposition: A | Discharge status: Home / Self Care | Payer: Medicare HMO | Source: Ambulatory Visit | Attending: Cardiology | Admitting: Cardiology

## 2019-08-24 ENCOUNTER — Other Ambulatory Visit: Payer: Self-pay

## 2019-08-24 DIAGNOSIS — Z01810 Encounter for preprocedural cardiovascular examination: Secondary | ICD-10-CM

## 2019-08-24 DIAGNOSIS — Z01818 Encounter for other preprocedural examination: Secondary | ICD-10-CM | POA: Insufficient documentation

## 2019-08-24 DIAGNOSIS — I2510 Atherosclerotic heart disease of native coronary artery without angina pectoris: Secondary | ICD-10-CM | POA: Diagnosis not present

## 2019-08-24 LAB — MYOCARDIAL PERFUSION IMAGING
LV dias vol: 106 mL (ref 46–106)
LV sys vol: 61 mL
Peak HR: 115 {beats}/min
Rest HR: 68 {beats}/min
SDS: 5
SRS: 16
SSS: 21
TID: 1.1

## 2019-08-24 MED ORDER — TECHNETIUM TC 99M TETROFOSMIN IV KIT
10.2000 | PACK | Freq: Once | INTRAVENOUS | Status: AC | PRN
  Administered 2019-08-24: 10.2 via INTRAVENOUS
  Filled 2019-08-24: qty 11

## 2019-08-24 MED ORDER — TECHNETIUM TC 99M TETROFOSMIN IV KIT
30.9000 | PACK | Freq: Once | INTRAVENOUS | Status: AC | PRN
  Administered 2019-08-24: 30.9 via INTRAVENOUS
  Filled 2019-08-24: qty 31

## 2019-08-24 MED ORDER — REGADENOSON 0.4 MG/5ML IV SOLN
0.4000 mg | Freq: Once | INTRAVENOUS | Status: AC
  Administered 2019-08-24: 0.4 mg via INTRAVENOUS

## 2019-08-27 ENCOUNTER — Ambulatory Visit: Payer: Medicare HMO | Admitting: Adult Health

## 2019-08-27 ENCOUNTER — Encounter: Payer: Self-pay | Admitting: Adult Health

## 2019-08-27 VITALS — BP 120/67 | HR 86 | Ht 59.0 in | Wt 122.0 lb

## 2019-08-27 DIAGNOSIS — Z9989 Dependence on other enabling machines and devices: Secondary | ICD-10-CM

## 2019-08-27 DIAGNOSIS — G4733 Obstructive sleep apnea (adult) (pediatric): Secondary | ICD-10-CM

## 2019-08-27 NOTE — Progress Notes (Signed)
PATIENT: Helen Washington DOB: 05-27-1945  REASON FOR VISIT: follow up HISTORY FROM: patient  HISTORY OF PRESENT ILLNESS: Today 08/27/19:  [name] is a 74 year old female with a history of obstructive sleep apnea on CPAP.  Her download indicates that he machine 27 out of 30 days for compliance of 90%.  She used her machine greater than 4 hours 22 out of 30 days for compliance of 73%.  On average she uses her machine 5 hours and 50 minutes.  Her residual AHI is 0.5 on 9 cm of water with EPR 3.  Leak in the 95th percentile is 29.5 L/min.  HISTORY Helen Washington is a 73 y.o. female who is being followed in this office for OSA on CPAP management. She was initially scheduled for face-to-face office visit today at this time but due to [facility] office visit rescheduled for non-face-to-face telephone visit.  CPAP compliance report from 03/07/2018 -04/05/2018 shows 26 out of 30 usage days for 87% compliance and greater than 4 hours 22 days for 73% compliance.  Average usage 5 hours and 50 minutes with residual AHI 1.1.  Leaks in the 95th percentile 25.3 L/min and set pressure 9 cm H2O and EPR level 3.  She does report difficulty tolerating mask as she is a "restless person" and can toss and turn at night which can make it difficult to tolerate CPAP throughout the night due to mask and tubing.  She does report overall feeling good throughout the day without any daytime fatigue or drowsiness.  No further concerns at this time.  REVIEW OF SYSTEMS: Out of a complete 14 system review of symptoms, the patient complains only of the following symptoms, and all other reviewed systems are negative.  FSS 29 ESS 8  ALLERGIES: Allergies  Allergen Reactions  . Codeine Nausea Only    HOME MEDICATIONS: Outpatient Medications Prior to Visit  Medication Sig Dispense Refill  . alendronate (FOSAMAX) 70 MG tablet Take 70 mg by mouth once a week. Take with a full glass of water on an empty stomach.    [name]  amLODipine (NORVASC) 10 MG tablet Take 1 tablet (10 mg total) by mouth daily. 30 tablet 3  . aspirin EC 81 MG tablet Take 1 tablet (81 mg total) by mouth daily. 90 tablet 3  . Aspirin-Caffeine 845-65 MG PACK Take 1 Package by mouth daily as needed (Headache).     . rosuvastatin (CRESTOR) 20 MG tablet Take 1 tablet (20 mg total) by mouth daily. 90 tablet 3  . Vitamin D, Cholecalciferol, 400 units TABS Take by mouth.     No facility-administered medications prior to visit.    PAST MEDICAL HISTORY: Past Medical History:  Diagnosis Date  . Hyperlipidemia   . Hypertension   . Insomnia     PAST SURGICAL HISTORY: Past Surgical History:  Procedure Laterality Date  . ABDOMINAL AORTOGRAM W/LOWER EXTREMITY Bilateral 05/15/2019   Procedure: ABDOMINAL AORTOGRAM W/LOWER EXTREMITY;  Surgeon: Serafina Mitchell, MD;  Location: [facility];  Service: Cardiovascular;  Laterality: Bilateral;  . CATARACT EXTRACTION, BILATERAL  12/2015;01/2016   [facility]  . TUBAL LIGATION  1969    FAMILY HISTORY: Family History  Problem Relation Age of Onset  . Stroke Mother   . Arthritis Mother   . CVA Mother   . Diverticulitis Mother   . Cancer Father   . Hypertension Sister   . Diverticulosis Sister   . Breast cancer Cousin   . Suicidality Brother   .  Hypertension Brother   . Anxiety disorder Brother   . Kidney Stones Brother   . Ulcers Brother   . Healthy Daughter     SOCIAL HISTORY: Social History   Socioeconomic History  . Marital status: Divorced    Spouse name: Not on file  . Number of children: 1  . Years of education: 37  . Highest education level: Not on file  Occupational History    Comment: retired Scientist, clinical (histocompatibility and immunogenetics), typist  Tobacco Use  . Smoking status: Former Smoker    Packs/day: 1.00    Years: 36.00    Pack years: 36.00    Types: Cigarettes    Quit date: 04/01/2000    Years since quitting: 19.4  . Smokeless tobacco: Never Used  Vaping Use  . Vaping Use:  Never used  Substance and Sexual Activity  . Alcohol use: No  . Drug use: No  . Sexual activity: Not on file  Other Topics Concern  . Not on file  Social History Narrative   Lives with room mate   Caffeine - coffee, 1 cup daily   Social Determinants of Health   Financial Resource Strain:   . Difficulty of Paying Living Expenses:   Food Insecurity:   . Worried About Charity fundraiser in the Last Year:   . Arboriculturist in the Last Year:   Transportation Needs:   . Film/video editor (Medical):   [name] Lack of Transportation (Non-Medical):   Physical Activity:   . Days of Exercise per Week:   . Minutes of Exercise per Session:   Stress:   . Feeling of Stress :   Social Connections:   . Frequency of Communication with Friends and Family:   . Frequency of Social Gatherings with Friends and Family:   . Attends Religious Services:   . Active Member of Clubs or Organizations:   . Attends Archivist Meetings:   [name] Marital Status:   Intimate Partner Violence:   . Fear of Current or Ex-Partner:   . Emotionally Abused:   [name] Physically Abused:   . Sexually Abused:       PHYSICAL EXAM  Vitals:   08/27/19 0807  BP: 120/67  Pulse: 86  Weight: 122 lb (55.3 kg)  Height: 4\' 11"  (1.499 m)   Body mass index is 24.64 kg/m.  Generalized: Well developed, in no acute distress  Chest: Lungs clear to auscultation bilaterally  Neurological examination  Mentation: Alert oriented to time, place, history taking. Follows all commands speech and language fluent Cranial nerve II-XII: Extraocular movements were full, visual field were full on confrontational test Head turning and shoulder shrug  were normal and symmetric. Motor: The motor testing reveals 5 over 5 strength of all 4 extremities. Good symmetric motor tone is noted throughout.  Sensory: Sensory testing is intact to soft touch on all 4 extremities. No evidence of extinction is noted.  Gait and station: Gait is  normal.    DIAGNOSTIC DATA (LABS, IMAGING, TESTING) - I reviewed patient records, labs, notes, testing and imaging myself where available.  Lab Results  Component Value Date   HGB 10.9 (L) 05/15/2019   HCT 32.0 (L) 05/15/2019      Component Value Date/Time   NA 139 06/04/2019 1632   K 5.1 06/04/2019 1632   CL 101 06/04/2019 1632   CO2 25 06/04/2019 1632   GLUCOSE 97 06/04/2019 1632   GLUCOSE 96 05/15/2019 0609   BUN 16 06/04/2019 1632  CREATININE 1.09 (H) 06/04/2019 1632   CALCIUM 10.9 (H) 06/04/2019 1632   GFRNONAA 50 (L) 06/04/2019 1632   GFRAA 58 (L) 06/04/2019 1632   No results found for: CHOL, HDL, LDLCALC, LDLDIRECT, TRIG, CHOLHDL No results found for: HGBA1C No results found for: VITAMINB12 No results found for: TSH    ASSESSMENT AND PLAN 74 y.o. year old female  has a past medical history of Hyperlipidemia, Hypertension, and Insomnia. here with:  1. OSA on CPAP  - CPAP compliance excellent - Good treatment of AHI  - Encourage patient to use CPAP nightly and > 4 hours each night - F/U in 1 year or sooner if needed   I spent 20 minutes of face-to-face and non-face-to-face time with patient.  This included previsit chart review, lab review, study review, order entry, electronic health record documentation, patient education.  Ward Givens, MSN, NP-C 08/27/2019, 8:15 AM [facility] [address] [place], [place] [address] [phone]

## 2019-08-29 ENCOUNTER — Other Ambulatory Visit: Payer: Self-pay

## 2019-08-29 ENCOUNTER — Ambulatory Visit (HOSPITAL_COMMUNITY): Payer: Medicare HMO | Attending: Cardiology

## 2019-08-29 DIAGNOSIS — Z01818 Encounter for other preprocedural examination: Secondary | ICD-10-CM | POA: Diagnosis not present

## 2019-08-29 DIAGNOSIS — I2510 Atherosclerotic heart disease of native coronary artery without angina pectoris: Secondary | ICD-10-CM | POA: Insufficient documentation

## 2019-08-29 LAB — ECHOCARDIOGRAM COMPLETE
Area-P 1/2: 4.8 cm2
S' Lateral: 3.6 cm

## 2019-08-31 ENCOUNTER — Telehealth: Payer: Self-pay | Admitting: Cardiology

## 2019-08-31 MED ORDER — CARVEDILOL 3.125 MG PO TABS
3.1250 mg | ORAL_TABLET | Freq: Two times a day (BID) | ORAL | 3 refills | Status: DC
Start: 2019-08-31 — End: 2020-08-21

## 2019-08-31 NOTE — Telephone Encounter (Signed)
Patient calling for stress test results.  

## 2019-08-31 NOTE — Telephone Encounter (Signed)
Patient called w/results. Rx(s) sent to pharmacy electronically. Routed to Dr. Trula Slade

## 2019-08-31 NOTE — Telephone Encounter (Signed)
Donato Heinz, MD  08/25/2019 12:40 PM EDT     Findings consistent with prior infarct, minimal ischemia. No further work-up recommended prior to her surgery    Donato Heinz, MD  08/31/2019 9:16 AM EDT     Mild heart pumping dysfunction, moderate leakage of mitral valve. Recommend monitoring with repeat echo in 1 year. For her heart pumping dysfunction, would first start carvedilol 3.125 mg BID. Check BP daily for next 2 weeks and call with results.

## 2019-09-03 ENCOUNTER — Encounter: Payer: Self-pay | Admitting: Cardiology

## 2019-09-03 ENCOUNTER — Telehealth: Payer: Self-pay | Admitting: Cardiology

## 2019-09-03 ENCOUNTER — Telehealth: Payer: Self-pay | Admitting: *Deleted

## 2019-09-03 NOTE — Telephone Encounter (Signed)
Spoke with patient regarding scheduling her surgery now that she has cardiac clearance. Our surgery scheduler will call her tomorrow to schedule.

## 2019-09-03 NOTE — Progress Notes (Signed)
Received BP log from patient.  BP appears well controlled, had 1 episode of low BP (95/46 on 7/23).  But overall BP has been 100s to 120s over 50s to 60s.   Given her systolic dysfunction, would recommend discontinuing amlodipine and instead starting losartan 25 mg daily.  Recommend continuing to check BP daily for next 2 weeks and call with results.  Recommend checking BMET in 1 week after starting losartan.

## 2019-09-03 NOTE — Telephone Encounter (Signed)
This information was given to [facility] for scheduling.

## 2019-09-03 NOTE — Telephone Encounter (Signed)
   Pt said someone called her today, no notes on file. She said she was looking on a different message

## 2019-09-05 ENCOUNTER — Other Ambulatory Visit: Payer: Self-pay

## 2019-09-06 ENCOUNTER — Other Ambulatory Visit: Payer: Self-pay | Admitting: *Deleted

## 2019-09-06 MED ORDER — LOSARTAN POTASSIUM 25 MG PO TABS: 25.0000 mg | ORAL_TABLET | Freq: Every day | ORAL | 3 refills | Status: DC

## 2019-09-06 NOTE — Progress Notes (Signed)
Patient aware of recommendations and verbalized understanding.  She has labwork on Monday for her surgery scheduled on 8/26.   Also will send updated blood pressure readings via Mychart in 2 weeks.

## 2019-09-07 NOTE — Progress Notes (Signed)
[id number] [place], [place] [address] [place] [address] Phone: [phone] Fax: [phone]      Your procedure is scheduled on Thursday, August 26.  Report to [facility] Main Entrance "A" at 7:15 A.M., and check in at the Admitting office.  Call this number if you have problems the morning of surgery:  [phone]  Call [phone] if you have any questions prior to your surgery date Monday-Friday 8am-4pm    Remember:  Do not eat or drink after midnight the night before your surgery     Take these medicines the morning of surgery with A SIP OF WATER   Carvedilol (Coreg)  Rosuvastatin (Crestor)  Follow your surgeon's instructions on when to stop Aspirin.  If no instructions were given by your surgeon then you will need to call the office to get those instructions.    As of today, STOP taking Aleve, Naproxen, Ibuprofen, Motrin, Advil, Goody's, BC's, all herbal medications, fish oil, and all vitamins.                      Do not wear jewelry, make up, or nail polish            Do not wear lotions, powders, perfumes, or deodorant.            Do not shave 48 hours prior to surgery.              Do not bring valuables to the hospital.            [facility] is not responsible for any belongings or valuables.  Do NOT Smoke (Tobacco/Vaping) or drink Alcohol 24 hours prior to your procedure If you use a CPAP at night, you may bring all equipment for your overnight stay.   Contacts, glasses, dentures or bridgework may not be worn into surgery.      For patients admitted to the hospital, discharge time will be determined by your treatment team.   Patients discharged the day of surgery will not be allowed to drive home, and someone needs to stay with them for 24 hours.    Special instructions:   [facility]- Preparing For Surgery  Before surgery, you can play an important role. Because skin  is not sterile, your skin needs to be as free of germs as possible. You can reduce the number of germs on your skin by washing with CHG (chlorahexidine gluconate) Soap before surgery.  CHG is an antiseptic cleaner which kills germs and bonds with the skin to continue killing germs even after washing.    Oral Hygiene is also important to reduce your risk of infection.  Remember - BRUSH YOUR TEETH THE MORNING OF SURGERY WITH YOUR REGULAR TOOTHPASTE  Please do not use if you have an allergy to CHG or antibacterial soaps. If your skin becomes reddened/irritated stop using the CHG.  Do not shave (including legs and underarms) for at least 48 hours prior to first CHG shower. It is OK to shave your face.  Please follow these instructions carefully.   1. Shower the NIGHT BEFORE SURGERY and the MORNING OF SURGERY with CHG Soap.   2. If you chose to wash your hair, wash your hair first as usual with your normal shampoo.  3. After you shampoo, rinse your hair and body thoroughly to remove the shampoo.  4. Use CHG as you would any other liquid  soap. You can apply CHG directly to the skin and wash gently with a scrungie or a clean washcloth.   5. Apply the CHG Soap to your body ONLY FROM THE NECK DOWN.  Do not use on open wounds or open sores. Avoid contact with your eyes, ears, mouth and genitals (private parts). Wash Face and genitals (private parts)  with your normal soap.   6. Wash thoroughly, paying special attention to the area where your surgery will be performed.  7. Thoroughly rinse your body with warm water from the neck down.  8. DO NOT shower/wash with your normal soap after using and rinsing off the CHG Soap.  9. Pat yourself dry with a CLEAN TOWEL.  10. Wear CLEAN PAJAMAS to bed the night before surgery  11. Place CLEAN SHEETS on your bed the night of your first shower and DO NOT SLEEP WITH PETS.   Day of Surgery: Wear Clean/Comfortable clothing the morning of surgery Do not apply  any deodorants/lotions.   Remember to brush your teeth WITH YOUR REGULAR TOOTHPASTE.   Please read over the following fact sheets that you were given.

## 2019-09-10 ENCOUNTER — Encounter (HOSPITAL_COMMUNITY): Payer: Self-pay

## 2019-09-10 ENCOUNTER — Other Ambulatory Visit: Payer: Self-pay

## 2019-09-10 ENCOUNTER — Encounter (HOSPITAL_COMMUNITY)
Admission: RE | Admit: 2019-09-10 | Discharge: 2019-09-10 | Disposition: A | Discharge status: Home / Self Care | Payer: Medicare HMO | Source: Ambulatory Visit | Attending: Surgery | Admitting: Surgery

## 2019-09-10 DIAGNOSIS — I10 Essential (primary) hypertension: Secondary | ICD-10-CM | POA: Insufficient documentation

## 2019-09-10 DIAGNOSIS — E785 Hyperlipidemia, unspecified: Secondary | ICD-10-CM | POA: Insufficient documentation

## 2019-09-10 DIAGNOSIS — Z01812 Encounter for preprocedural laboratory examination: Secondary | ICD-10-CM | POA: Insufficient documentation

## 2019-09-10 HISTORY — DX: Other complications of anesthesia, initial encounter: T88.59XA

## 2019-09-10 HISTORY — DX: Sleep apnea, unspecified: G47.30

## 2019-09-10 HISTORY — DX: Unspecified osteoarthritis, unspecified site: M19.90

## 2019-09-10 LAB — CBC
HCT: 36.6 % (ref 36.0–46.0)
Hemoglobin: 12.1 g/dL (ref 12.0–15.0)
MCH: 31.5 pg (ref 26.0–34.0)
MCHC: 33.1 g/dL (ref 30.0–36.0)
MCV: 95.3 fL (ref 80.0–100.0)
Platelets: 232 10*3/uL (ref 150–400)
RBC: 3.84 MIL/uL — ABNORMAL LOW (ref 3.87–5.11)
RDW: 13.2 % (ref 11.5–15.5)
WBC: 7 10*3/uL (ref 4.0–10.5)
nRBC: 0 % (ref 0.0–0.2)

## 2019-09-10 LAB — COMPREHENSIVE METABOLIC PANEL
ALT: 14 U/L (ref 0–44)
AST: 18 U/L (ref 15–41)
Albumin: 3.6 g/dL (ref 3.5–5.0)
Alkaline Phosphatase: 62 U/L (ref 38–126)
Anion gap: 10 (ref 5–15)
BUN: 15 mg/dL (ref 8–23)
CO2: 24 mmol/L (ref 22–32)
Calcium: 9.7 mg/dL (ref 8.9–10.3)
Chloride: 102 mmol/L (ref 98–111)
Creatinine, Ser: 1.02 mg/dL — ABNORMAL HIGH (ref 0.44–1.00)
GFR calc Af Amer: >60 mL/min (ref >60)
GFR calc non Af Amer: 54 mL/min — ABNORMAL LOW (ref >60)
Glucose, Bld: 94 mg/dL (ref 70–99)
Potassium: 4.3 mmol/L (ref 3.5–5.1)
Sodium: 136 mmol/L (ref 135–145)
Total Bilirubin: 0.4 mg/dL (ref 0.3–1.2)
Total Protein: 6.8 g/dL (ref 6.5–8.1)

## 2019-09-10 LAB — PROTIME-INR
INR: 1 (ref 0.8–1.2)
Prothrombin Time: 12.4 seconds (ref 11.4–15.2)

## 2019-09-10 LAB — URINALYSIS, ROUTINE W REFLEX MICROSCOPIC
Bacteria, UA: NONE SEEN
Bilirubin Urine: NEGATIVE
Glucose, UA: NEGATIVE mg/dL
Ketones, ur: NEGATIVE mg/dL
Nitrite: NEGATIVE
Protein, ur: NEGATIVE mg/dL
Specific Gravity, Urine: 1.01 (ref 1.005–1.030)
pH: 5 (ref 5.0–8.0)

## 2019-09-10 LAB — TYPE AND SCREEN
ABO/RH(D): O NEG
Antibody Screen: NEGATIVE

## 2019-09-10 LAB — SURGICAL PCR SCREEN
MRSA, PCR: NEGATIVE
Staphylococcus aureus: NEGATIVE

## 2019-09-10 LAB — APTT: aPTT: 30 seconds (ref 24–36)

## 2019-09-10 NOTE — Progress Notes (Signed)
PCP - [facility] Cardiologist - Oswaldo Milian   Chest x-ray - na EKG - 06/04/19 Stress Test - 08/24/19 ECHO - 08/29/19 Cardiac Cath - 05/15/19  Sleep Study - yes--will request CPAP - yes  Fasting Blood Sugar - na   Aspirin Instructions: continue    COVID TEST- 09/11/19   Anesthesia review:  Ekg/cardiac hx.  Patient denies shortness of breath, fever, cough and chest pain at PAT appointment   All instructions explained to the patient, with a verbal understanding of the material. Patient agrees to go over the instructions while at home for a better understanding. Patient also instructed to self quarantine after being tested for COVID-19. The opportunity to ask questions was provided.

## 2019-09-11 ENCOUNTER — Other Ambulatory Visit (HOSPITAL_COMMUNITY)
Admission: RE | Admit: 2019-09-11 | Discharge: 2019-09-11 | Disposition: A | Discharge status: Home / Self Care | Payer: Medicare HMO | Source: Ambulatory Visit | Attending: Surgery | Admitting: Surgery

## 2019-09-11 DIAGNOSIS — Z20822 Contact with and (suspected) exposure to covid-19: Secondary | ICD-10-CM | POA: Insufficient documentation

## 2019-09-11 DIAGNOSIS — Z01812 Encounter for preprocedural laboratory examination: Secondary | ICD-10-CM | POA: Insufficient documentation

## 2019-09-11 LAB — SARS CORONAVIRUS 2 (TAT 6-24 HRS): SARS Coronavirus 2: NEGATIVE

## 2019-09-11 NOTE — Progress Notes (Signed)
Anesthesia Chart Review:  Patient was recently referred to cardiology by Dr. Trula Slade for preop eval prior to bilateral femoral endarterectomy. Coronary CTA on 06/30/2019 showed calcium score 1503 (97 percentile).  CT FFR showed severe stenosis in small nondominant circumflex artery (was read as occluded mid LAD and mid RCA on FFR but reviewed with Dr Meda Coffee and appears to not be occluded but rather not modeled on FFR due to small vessel size <1.7mm).  Seen by cardiologist Dr. Gardiner Rhyme 08/07/2019 and per note plan was to check Lexiscan Myoview to ensure no significant ischemia.  Provided study is not high risk no further cardiac work-up would be recommended prior to surgery.  Nuclear stress done 08/24/2019 showed findings consistent with prior infarct minimal ischemia.  Dr. Gardiner Rhyme commented on result, "no further work-up recommended prior to her surgery."  TTE done 08/29/2019 showed EF 45 to 50%, grade 1 DD, mildly elevated pulmonary artery systolic pressure, moderate MR.  OSA on CPAP followed by neurology, notes indicate excellent compliance.  Preop labs reviewed, unremarkable.  EKG 06/04/2019: Sinus rhythm with sinus arrhythmia with occasional premature ventricular complexes.  Rate 77.  Possible LAE.  Nonspecific ST and T wave abnormality.  TTE 08/29/2019: 1. Left ventricular ejection fraction, by estimation, is 45 to 50%. The  left ventricle has mildly decreased function. The left ventricle  demonstrates global hypokinesis. Left ventricular diastolic parameters are  consistent with Grade I diastolic  dysfunction (impaired relaxation).  2. Right ventricular systolic function is normal. The right ventricular  size is normal. There is mildly elevated pulmonary artery systolic  pressure. The estimated right ventricular systolic pressure is 38.3 mmHg.  3. The mitral valve is normal in structure. Moderate mitral valve  regurgitation. No evidence of mitral stenosis.  4. The aortic valve is normal in  structure. Aortic valve regurgitation is  not visualized. No aortic stenosis is present.  5. The inferior vena cava is normal in size with greater than 50%  respiratory variability, suggesting right atrial pressure of 3 mmHg.   Nuclear stress 08/24/2019:  Nuclear stress EF: 42%. The left ventricular ejection fraction is moderately decreased (30-44%).  There is TW inversion in the inferior lateral leads.  Defect 1: There is a large defect of severe severity present in the basal inferoseptal, basal inferior, mid inferoseptal, mid inferior, mid inferolateral, apical septal, apical inferior and apex location.  Findings consistent with prior Inferior wall myocardial infarction. There is minimal reversibility  This is an intermediate risk study.   Wynonia Musty [facility]/Anesthesiology Phone [phone] 09/11/2019 5:11 PM

## 2019-09-11 NOTE — Anesthesia Preprocedure Evaluation (Addendum)
Anesthesia Evaluation  Patient identified by MRN, date of birth, ID band Patient awake    Reviewed: Allergy & Precautions, NPO status , Patient's Chart, lab work & pertinent test results, reviewed documented beta blocker date and time   History of Anesthesia Complications Negative for: history of anesthetic complications  Airway Mallampati: II  TM Distance: >3 FB Neck ROM: Full    Dental  (+) Dental Advisory Given, Teeth Intact   Pulmonary sleep apnea and Continuous Positive Airway Pressure Ventilation , former smoker,    Pulmonary exam normal        Cardiovascular hypertension, Pt. on home beta blockers and Pt. on medications +CHF  Normal cardiovascular exam+ Valvular Problems/Murmurs MR    '21 TTE - EF 45 to 50%. Global hypokinesis. Grade I diastolic dysfunction (impaired relaxation). Mildly elevated pulmonary artery systolic pressure. The estimated right ventricular systolic pressure is 64.3 mmHg. Moderate MR.  '21 Stress Test -  Nuclear stress EF: 42%. There is TW inversion in the inferior lateral leads. Defect 1: There is a large defect of severe severity present in the basal inferoseptal, basal inferior, mid inferoseptal, mid inferior, mid inferolateral, apical septal, apical inferior and apex location. Findings consistent with prior Inferior wall myocardial infarction. There is minimal reversibility. This is an intermediate risk study.     Neuro/Psych negative neurological ROS  negative psych ROS   GI/Hepatic negative GI ROS, Neg liver ROS,   Endo/Other  negative endocrine ROS  Renal/GU negative Renal ROS     Musculoskeletal  (+) Arthritis ,   Abdominal   Peds  Hematology negative hematology ROS (+)   Anesthesia Other Findings Covid test negative   Reproductive/Obstetrics                           Anesthesia Physical Anesthesia Plan  ASA: III  Anesthesia Plan: General   Post-op  Pain Management:    Induction: Intravenous  PONV Risk Score and Plan: 3 and Treatment may vary due to age or medical condition, Ondansetron and Dexamethasone  Airway Management Planned: Oral ETT  Additional Equipment: Arterial line  Intra-op Plan:   Post-operative Plan: Extubation in OR  Informed Consent: I have reviewed the patients History and Physical, chart, labs and discussed the procedure including the risks, benefits and alternatives for the proposed anesthesia with the patient or authorized representative who has indicated his/her understanding and acceptance.     Dental advisory given  Plan Discussed with: CRNA and Anesthesiologist  Anesthesia Plan Comments: (See PAT note regarding cardiac clearance)      Anesthesia Quick Evaluation

## 2019-09-13 ENCOUNTER — Inpatient Hospital Stay (HOSPITAL_COMMUNITY)
Admission: RE | Admit: 2019-09-13 | Discharge: 2019-09-15 | DRG: 254 | Disposition: A | Discharge status: Home / Self Care | Payer: Medicare HMO | Attending: Surgery | Admitting: Surgery

## 2019-09-13 ENCOUNTER — Inpatient Hospital Stay (HOSPITAL_COMMUNITY): Payer: Medicare HMO | Admitting: Physician Assistant

## 2019-09-13 ENCOUNTER — Encounter (HOSPITAL_COMMUNITY)
Admission: RE | Disposition: A | Discharge status: Home / Self Care | Payer: Self-pay | Source: Home / Self Care | Attending: Surgery

## 2019-09-13 ENCOUNTER — Encounter (HOSPITAL_COMMUNITY): Payer: Self-pay | Admitting: Surgery

## 2019-09-13 ENCOUNTER — Inpatient Hospital Stay (HOSPITAL_COMMUNITY): Payer: Medicare HMO | Admitting: Certified Registered Nurse Anesthetist

## 2019-09-13 ENCOUNTER — Inpatient Hospital Stay (HOSPITAL_COMMUNITY): Payer: Medicare HMO

## 2019-09-13 DIAGNOSIS — E7800 Pure hypercholesterolemia, unspecified: Secondary | ICD-10-CM | POA: Diagnosis not present

## 2019-09-13 DIAGNOSIS — Z803 Family history of malignant neoplasm of breast: Secondary | ICD-10-CM | POA: Diagnosis not present

## 2019-09-13 DIAGNOSIS — E785 Hyperlipidemia, unspecified: Secondary | ICD-10-CM | POA: Diagnosis not present

## 2019-09-13 DIAGNOSIS — Z885 Allergy status to narcotic agent status: Secondary | ICD-10-CM | POA: Diagnosis not present

## 2019-09-13 DIAGNOSIS — I70213 Atherosclerosis of native arteries of extremities with intermittent claudication, bilateral legs: Principal | ICD-10-CM | POA: Diagnosis present

## 2019-09-13 DIAGNOSIS — I110 Hypertensive heart disease with heart failure: Secondary | ICD-10-CM | POA: Diagnosis not present

## 2019-09-13 DIAGNOSIS — Z823 Family history of stroke: Secondary | ICD-10-CM | POA: Diagnosis not present

## 2019-09-13 DIAGNOSIS — R112 Nausea with vomiting, unspecified: Secondary | ICD-10-CM | POA: Diagnosis not present

## 2019-09-13 DIAGNOSIS — I70212 Atherosclerosis of native arteries of extremities with intermittent claudication, left leg: Secondary | ICD-10-CM | POA: Diagnosis not present

## 2019-09-13 DIAGNOSIS — Z79899 Other long term (current) drug therapy: Secondary | ICD-10-CM | POA: Diagnosis not present

## 2019-09-13 DIAGNOSIS — G4733 Obstructive sleep apnea (adult) (pediatric): Secondary | ICD-10-CM | POA: Diagnosis present

## 2019-09-13 DIAGNOSIS — I708 Atherosclerosis of other arteries: Secondary | ICD-10-CM | POA: Diagnosis not present

## 2019-09-13 DIAGNOSIS — Z8261 Family history of arthritis: Secondary | ICD-10-CM

## 2019-09-13 DIAGNOSIS — Z20822 Contact with and (suspected) exposure to covid-19: Secondary | ICD-10-CM | POA: Diagnosis present

## 2019-09-13 DIAGNOSIS — Z7983 Long term (current) use of bisphosphonates: Secondary | ICD-10-CM | POA: Diagnosis not present

## 2019-09-13 DIAGNOSIS — Z8249 Family history of ischemic heart disease and other diseases of the circulatory system: Secondary | ICD-10-CM | POA: Diagnosis not present

## 2019-09-13 DIAGNOSIS — I739 Peripheral vascular disease, unspecified: Secondary | ICD-10-CM | POA: Diagnosis present

## 2019-09-13 DIAGNOSIS — T4145XA Adverse effect of unspecified anesthetic, initial encounter: Secondary | ICD-10-CM | POA: Diagnosis not present

## 2019-09-13 DIAGNOSIS — Z87891 Personal history of nicotine dependence: Secondary | ICD-10-CM | POA: Diagnosis not present

## 2019-09-13 DIAGNOSIS — I509 Heart failure, unspecified: Secondary | ICD-10-CM | POA: Diagnosis not present

## 2019-09-13 DIAGNOSIS — I70211 Atherosclerosis of native arteries of extremities with intermittent claudication, right leg: Secondary | ICD-10-CM | POA: Diagnosis not present

## 2019-09-13 DIAGNOSIS — I10 Essential (primary) hypertension: Secondary | ICD-10-CM | POA: Diagnosis not present

## 2019-09-13 HISTORY — PX: ENDARTERECTOMY FEMORAL: SHX5804

## 2019-09-13 HISTORY — PX: PATCH ANGIOPLASTY: SHX6230

## 2019-09-13 HISTORY — PX: INSERTION OF ILIAC STENT: SHX6256

## 2019-09-13 LAB — CREATININE, SERUM
Creatinine, Ser: 0.98 mg/dL (ref 0.44–1.00)
GFR calc Af Amer: >60 mL/min (ref >60)
GFR calc non Af Amer: 57 mL/min — ABNORMAL LOW (ref >60)

## 2019-09-13 LAB — CBC
HCT: 30.9 % — ABNORMAL LOW (ref 36.0–46.0)
Hemoglobin: 10.5 g/dL — ABNORMAL LOW (ref 12.0–15.0)
MCH: 31.7 pg (ref 26.0–34.0)
MCHC: 34 g/dL (ref 30.0–36.0)
MCV: 93.4 fL (ref 80.0–100.0)
Platelets: 199 10*3/uL (ref 150–400)
RBC: 3.31 MIL/uL — ABNORMAL LOW (ref 3.87–5.11)
RDW: 12.8 % (ref 11.5–15.5)
WBC: 10.1 10*3/uL (ref 4.0–10.5)
nRBC: 0 % (ref 0.0–0.2)

## 2019-09-13 LAB — POCT ACTIVATED CLOTTING TIME
Activated Clotting Time: 197 seconds
Activated Clotting Time: 224 seconds

## 2019-09-13 LAB — ABO/RH: ABO/RH(D): O NEG

## 2019-09-13 SURGERY — ENDARTERECTOMY, FEMORAL
Anesthesia: General | Laterality: Left

## 2019-09-13 MED ORDER — LABETALOL HCL 5 MG/ML IV SOLN: 10.0000 mg | INTRAVENOUS | Status: DC | PRN

## 2019-09-13 MED ORDER — LACTATED RINGERS IV SOLN: INTRAVENOUS | Status: DC

## 2019-09-13 MED ORDER — SUGAMMADEX SODIUM 200 MG/2ML IV SOLN
INTRAVENOUS | Status: DC | PRN
  Administered 2019-09-13: 200 mg via INTRAVENOUS

## 2019-09-13 MED ORDER — LOSARTAN POTASSIUM 25 MG PO TABS
25.0000 mg | ORAL_TABLET | Freq: Every day | ORAL | Status: DC
  Administered 2019-09-14 – 2019-09-15 (×2): 25 mg via ORAL
  Filled 2019-09-13 (×2): qty 1

## 2019-09-13 MED ORDER — SODIUM CHLORIDE 0.9 % IV SOLN: INTRAVENOUS | Status: DC

## 2019-09-13 MED ORDER — MAGNESIUM SULFATE 2 GM/50ML IV SOLN: 2.0000 g | Freq: Every day | INTRAVENOUS | Status: DC | PRN

## 2019-09-13 MED ORDER — FENTANYL CITRATE (PF) 100 MCG/2ML IJ SOLN
INTRAMUSCULAR | Status: AC
Start: 2019-09-13 — End: 2019-09-13
  Filled 2019-09-13: qty 2

## 2019-09-13 MED ORDER — GUAIFENESIN-DM 100-10 MG/5ML PO SYRP: 15.0000 mL | ORAL_SOLUTION | ORAL | Status: DC | PRN

## 2019-09-13 MED ORDER — CEFAZOLIN SODIUM-DEXTROSE 2-4 GM/100ML-% IV SOLN
2.0000 g | INTRAVENOUS | Status: AC
  Administered 2019-09-13: 2 g via INTRAVENOUS

## 2019-09-13 MED ORDER — PROTAMINE SULFATE 10 MG/ML IV SOLN
INTRAVENOUS | Status: DC | PRN
  Administered 2019-09-13: 50 mg via INTRAVENOUS

## 2019-09-13 MED ORDER — FENTANYL CITRATE (PF) 250 MCG/5ML IJ SOLN
INTRAMUSCULAR | Status: AC
  Filled 2019-09-13: qty 5

## 2019-09-13 MED ORDER — OXYCODONE HCL 5 MG PO TABS: 5.0000 mg | ORAL_TABLET | Freq: Once | ORAL | Status: DC | PRN

## 2019-09-13 MED ORDER — ASPIRIN EC 81 MG PO TBEC
81.0000 mg | DELAYED_RELEASE_TABLET | Freq: Every day | ORAL | Status: DC
  Administered 2019-09-14 – 2019-09-15 (×2): 81 mg via ORAL
  Filled 2019-09-13 (×2): qty 1

## 2019-09-13 MED ORDER — ONDANSETRON HCL 4 MG/2ML IJ SOLN: 4.0000 mg | Freq: Once | INTRAMUSCULAR | Status: DC | PRN

## 2019-09-13 MED ORDER — OXYCODONE HCL 5 MG/5ML PO SOLN: 5.0000 mg | Freq: Once | ORAL | Status: DC | PRN

## 2019-09-13 MED ORDER — ONDANSETRON HCL 4 MG/2ML IJ SOLN
INTRAMUSCULAR | Status: AC
  Filled 2019-09-13: qty 2

## 2019-09-13 MED ORDER — DEXAMETHASONE SODIUM PHOSPHATE 10 MG/ML IJ SOLN
INTRAMUSCULAR | Status: DC | PRN
  Administered 2019-09-13: 5 mg via INTRAVENOUS

## 2019-09-13 MED ORDER — PANTOPRAZOLE SODIUM 40 MG PO TBEC
40.0000 mg | DELAYED_RELEASE_TABLET | Freq: Every day | ORAL | Status: DC
  Administered 2019-09-14 – 2019-09-15 (×2): 40 mg via ORAL
  Filled 2019-09-13 (×2): qty 1

## 2019-09-13 MED ORDER — HEMOSTATIC AGENTS (NO CHARGE) OPTIME
TOPICAL | Status: DC | PRN
  Administered 2019-09-13 (×3): 1 via TOPICAL

## 2019-09-13 MED ORDER — CHLORHEXIDINE GLUCONATE 0.12 % MT SOLN
OROMUCOSAL | Status: AC
  Administered 2019-09-13: 15 mL via OROMUCOSAL
  Filled 2019-09-13: qty 15

## 2019-09-13 MED ORDER — PROTAMINE SULFATE 10 MG/ML IV SOLN
INTRAVENOUS | Status: AC
  Filled 2019-09-13: qty 5

## 2019-09-13 MED ORDER — DOCUSATE SODIUM 100 MG PO CAPS
100.0000 mg | ORAL_CAPSULE | Freq: Every day | ORAL | Status: DC
  Administered 2019-09-14 – 2019-09-15 (×2): 100 mg via ORAL
  Filled 2019-09-13 (×2): qty 1

## 2019-09-13 MED ORDER — ROSUVASTATIN CALCIUM 20 MG PO TABS
20.0000 mg | ORAL_TABLET | Freq: Every day | ORAL | Status: DC
  Administered 2019-09-14 – 2019-09-15 (×2): 20 mg via ORAL
  Filled 2019-09-13 (×2): qty 1

## 2019-09-13 MED ORDER — EPHEDRINE 5 MG/ML INJ
INTRAVENOUS | Status: AC
  Filled 2019-09-13: qty 10

## 2019-09-13 MED ORDER — FENTANYL CITRATE (PF) 100 MCG/2ML IJ SOLN
INTRAMUSCULAR | Status: DC | PRN
Start: 2019-09-13 — End: 2019-09-13
  Administered 2019-09-13 (×5): 50 ug via INTRAVENOUS

## 2019-09-13 MED ORDER — CEFAZOLIN SODIUM-DEXTROSE 2-4 GM/100ML-% IV SOLN
2.0000 g | Freq: Three times a day (TID) | INTRAVENOUS | Status: AC
  Administered 2019-09-13 (×2): 2 g via INTRAVENOUS
  Filled 2019-09-13 (×2): qty 100

## 2019-09-13 MED ORDER — PHENYLEPHRINE 40 MCG/ML (10ML) SYRINGE FOR IV PUSH (FOR BLOOD PRESSURE SUPPORT)
PREFILLED_SYRINGE | INTRAVENOUS | Status: DC | PRN
  Administered 2019-09-13: 40 ug via INTRAVENOUS

## 2019-09-13 MED ORDER — EPHEDRINE SULFATE-NACL 50-0.9 MG/10ML-% IV SOSY
PREFILLED_SYRINGE | INTRAVENOUS | Status: DC | PRN
  Administered 2019-09-13 (×4): 5 mg via INTRAVENOUS
  Administered 2019-09-13 (×2): 10 mg via INTRAVENOUS
  Administered 2019-09-13: 5 mg via INTRAVENOUS
  Administered 2019-09-13: 10 mg via INTRAVENOUS

## 2019-09-13 MED ORDER — POTASSIUM CHLORIDE CRYS ER 20 MEQ PO TBCR: 20.0000 meq | EXTENDED_RELEASE_TABLET | Freq: Every day | ORAL | Status: DC | PRN

## 2019-09-13 MED ORDER — ACETAMINOPHEN 650 MG RE SUPP: 325.0000 mg | RECTAL | Status: DC | PRN

## 2019-09-13 MED ORDER — FENTANYL CITRATE (PF) 100 MCG/2ML IJ SOLN: 25.0000 ug | INTRAMUSCULAR | Status: DC | PRN

## 2019-09-13 MED ORDER — IODIXANOL 320 MG/ML IV SOLN
INTRAVENOUS | Status: DC | PRN
  Administered 2019-09-13: 16 mL via INTRA_ARTERIAL

## 2019-09-13 MED ORDER — BISACODYL 5 MG PO TBEC: 5.0000 mg | DELAYED_RELEASE_TABLET | Freq: Every day | ORAL | Status: DC | PRN

## 2019-09-13 MED ORDER — CHLORHEXIDINE GLUCONATE CLOTH 2 % EX PADS: 6.0000 | MEDICATED_PAD | Freq: Every day | CUTANEOUS | Status: DC

## 2019-09-13 MED ORDER — PHENYLEPHRINE 40 MCG/ML (10ML) SYRINGE FOR IV PUSH (FOR BLOOD PRESSURE SUPPORT)
PREFILLED_SYRINGE | INTRAVENOUS | Status: AC
  Filled 2019-09-13: qty 10

## 2019-09-13 MED ORDER — LIDOCAINE 2% (20 MG/ML) 5 ML SYRINGE
INTRAMUSCULAR | Status: DC | PRN
  Administered 2019-09-13: 40 mg via INTRAVENOUS

## 2019-09-13 MED ORDER — ONDANSETRON HCL 4 MG/2ML IJ SOLN
INTRAMUSCULAR | Status: DC | PRN
  Administered 2019-09-13: 4 mg via INTRAVENOUS

## 2019-09-13 MED ORDER — SODIUM CHLORIDE 0.9 % IV SOLN: INTRAVENOUS | Status: DC | PRN

## 2019-09-13 MED ORDER — PROPOFOL 10 MG/ML IV BOLUS
INTRAVENOUS | Status: DC | PRN
  Administered 2019-09-13: 80 mg via INTRAVENOUS

## 2019-09-13 MED ORDER — ROCURONIUM BROMIDE 10 MG/ML (PF) SYRINGE
PREFILLED_SYRINGE | INTRAVENOUS | Status: AC
  Filled 2019-09-13: qty 10

## 2019-09-13 MED ORDER — ONDANSETRON HCL 4 MG/2ML IJ SOLN
4.0000 mg | Freq: Four times a day (QID) | INTRAMUSCULAR | Status: DC | PRN
  Administered 2019-09-13: 4 mg via INTRAVENOUS
  Filled 2019-09-13: qty 2

## 2019-09-13 MED ORDER — SODIUM CHLORIDE 0.9 % IV SOLN: 500.0000 mL | Freq: Once | INTRAVENOUS | Status: DC | PRN

## 2019-09-13 MED ORDER — ALUM & MAG HYDROXIDE-SIMETH 200-200-20 MG/5ML PO SUSP: 15.0000 mL | ORAL | Status: DC | PRN

## 2019-09-13 MED ORDER — HEPARIN SODIUM (PORCINE) 1000 UNIT/ML IJ SOLN
INTRAMUSCULAR | Status: AC
  Filled 2019-09-13: qty 1

## 2019-09-13 MED ORDER — CLOPIDOGREL BISULFATE 75 MG PO TABS
75.0000 mg | ORAL_TABLET | Freq: Every day | ORAL | Status: DC
  Administered 2019-09-14 – 2019-09-15 (×2): 75 mg via ORAL
  Filled 2019-09-13 (×2): qty 1

## 2019-09-13 MED ORDER — HEPARIN SODIUM (PORCINE) 1000 UNIT/ML IJ SOLN
INTRAMUSCULAR | Status: DC | PRN
  Administered 2019-09-13: 2000 [IU] via INTRAVENOUS
  Administered 2019-09-13: 6000 [IU] via INTRAVENOUS

## 2019-09-13 MED ORDER — PHENOL 1.4 % MT LIQD: 1.0000 | OROMUCOSAL | Status: DC | PRN

## 2019-09-13 MED ORDER — HYDRALAZINE HCL 20 MG/ML IJ SOLN: 5.0000 mg | INTRAMUSCULAR | Status: DC | PRN

## 2019-09-13 MED ORDER — HYDROMORPHONE HCL 1 MG/ML IJ SOLN: 0.5000 mg | INTRAMUSCULAR | Status: DC | PRN

## 2019-09-13 MED ORDER — CARVEDILOL 3.125 MG PO TABS
3.1250 mg | ORAL_TABLET | Freq: Two times a day (BID) | ORAL | Status: DC
  Administered 2019-09-13 – 2019-09-15 (×4): 3.125 mg via ORAL
  Filled 2019-09-13 (×4): qty 1

## 2019-09-13 MED ORDER — OXYCODONE HCL 5 MG PO TABS: 5.0000 mg | ORAL_TABLET | ORAL | Status: DC | PRN

## 2019-09-13 MED ORDER — CHLORHEXIDINE GLUCONATE CLOTH 2 % EX PADS: 6.0000 | MEDICATED_PAD | Freq: Once | CUTANEOUS | Status: DC

## 2019-09-13 MED ORDER — SENNOSIDES-DOCUSATE SODIUM 8.6-50 MG PO TABS: 1.0000 | ORAL_TABLET | Freq: Every evening | ORAL | Status: DC | PRN

## 2019-09-13 MED ORDER — LIDOCAINE 2% (20 MG/ML) 5 ML SYRINGE
INTRAMUSCULAR | Status: AC
  Filled 2019-09-13: qty 5

## 2019-09-13 MED ORDER — 0.9 % SODIUM CHLORIDE (POUR BTL) OPTIME
TOPICAL | Status: DC | PRN
  Administered 2019-09-13: 1000 mL

## 2019-09-13 MED ORDER — FLEET ENEMA 7-19 GM/118ML RE ENEM: 1.0000 | ENEMA | Freq: Once | RECTAL | Status: DC | PRN

## 2019-09-13 MED ORDER — CEFAZOLIN SODIUM-DEXTROSE 2-4 GM/100ML-% IV SOLN
INTRAVENOUS | Status: AC
  Filled 2019-09-13: qty 100

## 2019-09-13 MED ORDER — CHLORHEXIDINE GLUCONATE 0.12 % MT SOLN: 15.0000 mL | Freq: Once | OROMUCOSAL | Status: AC

## 2019-09-13 MED ORDER — ORAL CARE MOUTH RINSE: 15.0000 mL | Freq: Once | OROMUCOSAL | Status: AC

## 2019-09-13 MED ORDER — ROCURONIUM BROMIDE 10 MG/ML (PF) SYRINGE
PREFILLED_SYRINGE | INTRAVENOUS | Status: DC | PRN
  Administered 2019-09-13: 20 mg via INTRAVENOUS
  Administered 2019-09-13: 60 mg via INTRAVENOUS

## 2019-09-13 MED ORDER — DEXAMETHASONE SODIUM PHOSPHATE 10 MG/ML IJ SOLN
INTRAMUSCULAR | Status: AC
  Filled 2019-09-13: qty 1

## 2019-09-13 MED ORDER — SODIUM CHLORIDE 0.9 % IV SOLN
INTRAVENOUS | Status: AC
  Filled 2019-09-13: qty 1.2

## 2019-09-13 MED ORDER — METOPROLOL TARTRATE 5 MG/5ML IV SOLN: 2.0000 mg | INTRAVENOUS | Status: DC | PRN

## 2019-09-13 MED ORDER — ALENDRONATE SODIUM 70 MG PO TABS: 70.0000 mg | ORAL_TABLET | ORAL | Status: DC

## 2019-09-13 MED ORDER — ACETAMINOPHEN 325 MG PO TABS: 325.0000 mg | ORAL_TABLET | ORAL | Status: DC | PRN

## 2019-09-13 MED ORDER — HEPARIN SODIUM (PORCINE) 5000 UNIT/ML IJ SOLN
5000.0000 [IU] | Freq: Three times a day (TID) | INTRAMUSCULAR | Status: DC
  Administered 2019-09-14 – 2019-09-15 (×4): 5000 [IU] via SUBCUTANEOUS
  Filled 2019-09-13 (×4): qty 1

## 2019-09-13 SURGICAL SUPPLY — 72 items
BALLN MUSTANG 6X80X135 (BALLOONS) ×3
BALLOON MUSTANG 6X80X135 (BALLOONS) ×2 IMPLANT
CANISTER SUCT 3000ML PPV (MISCELLANEOUS) ×3 IMPLANT
CATH EMB 4FR 40CM (CATHETERS) ×3 IMPLANT
CATH EMB 5FR 80CM (CATHETERS) ×3 IMPLANT
CATH OMNI FLUSH .035X70CM (CATHETERS) ×3 IMPLANT
CATH OMNI FLUSH 5F 65CM (CATHETERS) ×3 IMPLANT
CLIP VESOCCLUDE MED 24/CT (CLIP) ×3 IMPLANT
CLIP VESOCCLUDE SM WIDE 24/CT (CLIP) ×3 IMPLANT
CONT SPEC 4OZ CLIKSEAL STRL BL (MISCELLANEOUS) ×3 IMPLANT
COVER DOME SNAP 22 D (MISCELLANEOUS) ×3 IMPLANT
COVER WAND RF STERILE (DRAPES) IMPLANT
DERMABOND ADVANCED (GAUZE/BANDAGES/DRESSINGS) ×2
DERMABOND ADVANCED .7 DNX12 (GAUZE/BANDAGES/DRESSINGS) ×4 IMPLANT
DRAIN CHANNEL 15F RND FF W/TCR (WOUND CARE) IMPLANT
DRAPE C-ARM 42X72 X-RAY (DRAPES) IMPLANT
DRAPE X-RAY CASS 24X20 (DRAPES) IMPLANT
ELECT REM PT RETURN 9FT ADLT (ELECTROSURGICAL) ×6
ELECTRODE REM PT RTRN 9FT ADLT (ELECTROSURGICAL) ×4 IMPLANT
EVACUATOR SILICONE 100CC (DRAIN) IMPLANT
GLOVE BIOGEL PI IND STRL 7.5 (GLOVE) ×2 IMPLANT
GLOVE BIOGEL PI INDICATOR 7.5 (GLOVE) ×1
GLOVE SURG SS PI 7.5 STRL IVOR (GLOVE) ×3 IMPLANT
GOWN STRL REUS W/ TWL LRG LVL3 (GOWN DISPOSABLE) ×4 IMPLANT
GOWN STRL REUS W/ TWL XL LVL3 (GOWN DISPOSABLE) ×2 IMPLANT
GOWN STRL REUS W/TWL LRG LVL3 (GOWN DISPOSABLE) ×6
GOWN STRL REUS W/TWL XL LVL3 (GOWN DISPOSABLE) ×3
HEMOSTAT SNOW SURGICEL 2X4 (HEMOSTASIS) ×6 IMPLANT
KIT BASIN OR (CUSTOM PROCEDURE TRAY) ×3 IMPLANT
KIT DRSG PREVENA PLUS 7DAY 125 (MISCELLANEOUS) ×3 IMPLANT
KIT ENCORE 26 ADVANTAGE (KITS) ×3 IMPLANT
KIT HEART LEFT (KITS) IMPLANT
KIT PREVENA INCISION MGT 13 (CANNISTER) ×6 IMPLANT
KIT TURNOVER KIT B (KITS) ×3 IMPLANT
NS IRRIG 1000ML POUR BTL (IV SOLUTION) ×9 IMPLANT
PACK ENDO MINOR (CUSTOM PROCEDURE TRAY) ×3 IMPLANT
PACK PERIPHERAL VASCULAR (CUSTOM PROCEDURE TRAY) ×3 IMPLANT
PAD ARMBOARD 7.5X6 YLW CONV (MISCELLANEOUS) ×6 IMPLANT
PATCH VASC XENOSURE 1CMX6CM (Vascular Products) ×6 IMPLANT
PATCH VASC XENOSURE 1X6 (Vascular Products) ×4 IMPLANT
PROTECTION STATION PRESSURIZED (MISCELLANEOUS) ×3
SET COLLECT BLD 21X3/4 12 (NEEDLE) IMPLANT
SET MICROPUNCTURE 5F STIFF (MISCELLANEOUS) ×3 IMPLANT
SET WALTER ACTIVATION W/DRAPE (SET/KITS/TRAYS/PACK) IMPLANT
SHEATH BRITE TIP 7FRX11 (SHEATH) ×3 IMPLANT
SHEATH PINNACLE 5F 10CM (SHEATH) IMPLANT
SPONGE INTESTINAL PEANUT (DISPOSABLE) ×3 IMPLANT
STATION PROTECTION PRESSURIZED (MISCELLANEOUS) ×2 IMPLANT
STENT INNOVA 7X80X130 (Permanent Stent) ×3 IMPLANT
STOPCOCK 4 WAY LG BORE MALE ST (IV SETS) ×3 IMPLANT
STOPCOCK MORSE 400PSI 3WAY (MISCELLANEOUS) ×3 IMPLANT
SURGIFLO W/THROMBIN 8M KIT (HEMOSTASIS) ×3 IMPLANT
SUT ETHILON 3 0 PS 1 (SUTURE) IMPLANT
SUT PROLENE 5 0 C 1 24 (SUTURE) ×6 IMPLANT
SUT PROLENE 6 0 BV (SUTURE) ×15 IMPLANT
SUT SILK 3 0 (SUTURE) ×3
SUT SILK 3-0 18XBRD TIE 12 (SUTURE) ×2 IMPLANT
SUT VIC AB 2-0 CT1 27 (SUTURE) ×12
SUT VIC AB 2-0 CT1 TAPERPNT 27 (SUTURE) ×8 IMPLANT
SUT VIC AB 3-0 SH 27 (SUTURE) ×6
SUT VIC AB 3-0 SH 27X BRD (SUTURE) ×4 IMPLANT
SUT VIC AB 3-0 X1 27 (SUTURE) ×3 IMPLANT
SYR 10ML LL (SYRINGE) ×6 IMPLANT
SYR 20ML LL LF (SYRINGE) ×6 IMPLANT
SYR MEDRAD MARK V 150ML (SYRINGE) IMPLANT
TOWEL GREEN STERILE (TOWEL DISPOSABLE) ×3 IMPLANT
TUBING EXTENTION W/L.L. (IV SETS) IMPLANT
TUBING HIGH PRESSURE 120CM (CONNECTOR) IMPLANT
UNDERPAD 30X36 HEAVY ABSORB (UNDERPADS AND DIAPERS) ×3 IMPLANT
WATER STERILE IRR 1000ML POUR (IV SOLUTION) ×3 IMPLANT
WIRE BENTSON .035X145CM (WIRE) ×3 IMPLANT
WIRE HI TORQ VERSACORE J 260CM (WIRE) ×3 IMPLANT

## 2019-09-13 NOTE — Anesthesia Procedure Notes (Signed)
Procedure Name: Intubation Date/Time: 09/13/2019 7:45 AM Performed by: Genelle Bal, CRNA Pre-anesthesia Checklist: Patient identified, Emergency Drugs available, Suction available and Patient being monitored Patient Re-evaluated:Patient Re-evaluated prior to induction Oxygen Delivery Method: Circle system utilized Preoxygenation: Pre-oxygenation with 100% oxygen Induction Type: IV induction Ventilation: Mask ventilation without difficulty Laryngoscope Size: Miller and 2 Grade View: Grade I Tube type: Oral Tube size: 7.0 mm Number of attempts: 1 Airway Equipment and Method: Stylet Placement Confirmation: ETT inserted through vocal cords under direct vision,  positive ETCO2 and breath sounds checked- equal and bilateral Secured at: 20 cm Tube secured with: Tape Dental Injury: Teeth and Oropharynx as per pre-operative assessment

## 2019-09-13 NOTE — Care Management Important Message (Signed)
Important Message  Patient Details  Name: Abrial Arrighi MRN: [id number] Date of Birth: [birth year]   Medicare Important Message Given:  Yes     Shelda Altes 09/13/2019, 4:29 PM

## 2019-09-13 NOTE — Progress Notes (Signed)
  Day of Surgery Note    Subjective:  No complaints; seen in pacu   Vitals:   09/13/19 1135 09/13/19 1235  BP: (!) 123/58 135/70  Pulse: 80   Resp: 14 12  Temp:    SpO2: 97%     Incisions:   Bilateral groins with Pravena vac in place with good seal Extremities:  + doppler signals bilateral PT with right > left Cardiac:  regular Lungs:  Non labored    Assessment/Plan:  This is a 74 y.o. female who is s/p  Bilateral femoral endarterectomy   -pt doing well in recovery with + doppler signals bilateral PT -pravena vac dressings in place bilaterally with good seal.  -to [place] when bed available   Leontine Locket, PA-C 09/13/2019 12:53 PM [phone]

## 2019-09-13 NOTE — Op Note (Signed)
Patient name: Helen Washington MRN: [id number] DOB: September 22, 1945 Sex: female  09/13/2019 Pre-operative Diagnosis: Bilateral claudication Post-operative diagnosis:  Same Surgeon:  [name]  Co-surgeon: Servando Snare Assistants: Laurence Slate Procedure:   #1: Right femoral endarterectomy with bovine pericardial patch angioplasty   #2: Left femoral endarterectomy with bovine pericardial patch angioplasty   #3: First order catheterization   #4: Right lower extremity angiogram   #5: Left external iliac artery stent   #6: Bilateral Prevena wound VAC Anesthesia: General Blood Loss: 200 cc Specimens: None  Findings: Near occlusive plaque in bilateral common femoral arteries.  There was greater than 80% stenosis within the left external iliac artery which was successfully stented using a 7 x 100 stent with no residual stenosis.  Indications: The patient is suffering from severe bilateral claudication.  Angiography revealed occlusion or near occlusion of bilateral femoral arteries and severe stenosis within the left external iliac artery.  She comes in today for surgical correction.  Procedure:  The patient was identified in the holding area and taken to [facility]  The patient was then placed supine on the table. general anesthesia was administered.  The patient was prepped and draped in the usual sterile fashion.  A time out was called and antibiotics were administered.  A PA was necessary for the technical details of the procedure including suction retraction and suture closure.  The right side will be dictated separately by Dr. Donzetta Matters.  A longitudinal incision was made in the left groin.  Cautery was used about the subcutaneous tissue down the femoral sheath which was opened sharply.  The common femoral artery was circumferentially dissected out from the inguinal ligament down to the bifurcation.  The profunda and superficial femoral artery were individually isolated.  The patient was then fully  heparinized.  The femoral arteries were then occluded with vascular clamps.  A #11 blade was used to make an arteriotomy which was extended longitudinally with Potts scissors.  The patient had an exophytic plaque which is nearly occlusive within the common femoral artery.  Endarterectomy was performed using a Garment/textile technologist.  I was able to get a good distal endpoint in the superficial femoral artery, opening the superficial femoral artery for approximately 1 cm.  Endarterectomy was carried down to the origin of the profundofemoral artery where I got a good distal endpoint.  The profunda and superficial femoral artery were very soft after the plaque was removed.  I then remove the plaque in the common femoral artery up to the clamp.  I then removed the clamp and placed a 5 Fogarty balloon proximally and removed all visible plaque up under the inguinal ligament.  All potential embolic debris was removed.  A bovine pericardial patch was selected and patch angioplasty was performed using a running 5-0 Prolene.  Prior to completion the appropriate flushing maneuvers were performed and the anastomosis was completed.  There was an excellent Doppler signal in the profunda and superficial femoral artery.  I then used a micropuncture needle to cannulate the patch.  A 018 wire was advanced without resistance and a micropuncture sheath was placed.  A Bentson wire was inserted followed by a 7 [place] sheath.  An Omni Flush catheter was then advanced over the wire.  The aortic bifurcation was crossed and the catheter was placed into the right common iliac artery and right lower extremity angiogram was performed.  This showed a widely patent common and external iliac artery.  The patch in the right  common femoral artery is widely patent as was the superficial femoral and profundofemoral artery.  I then performed a contrast injection through the sheath which showed greater than 80% stenosis within the left external iliac artery.   I elected to primarily stent this.  A 7 x 100 INNOVA stent was selected.  It was deployed landing at the hypogastric artery going down to the proximal patch.  It was molded using a 6 mm balloon.  Follow-up imaging showed resolution of the stenosis.  The sheath was then removed and the hole in the patch was closed with 5-0 Prolene.  The heparin was then reversed with 50 mg of protamine.  Surgi-Flo was used to facilitate hemostasis.  Once I was satisfied with hemostasis, the femoral sheath was reapproximated with 2-0 Vicryl.  The subcutaneous tissue was then closed with an additional layer of 2-0 and 3-0 Vicryl, followed by 3-0 Vicryl on the skin.  Prevena wound vacs were placed on both incisions.  The patient was successfully extubated and taken to recovery in stable condition.   Disposition: To PACU stable   V. [name], M.D., [facility] [place] Office: [phone] Pager:  [phone]

## 2019-09-13 NOTE — Plan of Care (Signed)
Continue to monitor

## 2019-09-13 NOTE — Anesthesia Postprocedure Evaluation (Signed)
Anesthesia Post Note  Patient: Helen Washington  Procedure(s) Performed: BILATERAL FEMORAL ENDARTERECTOMY with Patch Angioplasty (Bilateral ) INSERTION OF LEFT ILIAC STENT (Left ) PATCH ANGIOPLASTY Bilateral (Bilateral )     Patient location during evaluation: PACU Anesthesia Type: General Level of consciousness: awake and alert Pain management: pain level controlled Vital Signs Assessment: post-procedure vital signs reviewed and stable Respiratory status: spontaneous breathing, nonlabored ventilation and respiratory function stable Cardiovascular status: blood pressure returned to baseline and stable Postop Assessment: no apparent nausea or vomiting Anesthetic complications: no   No complications documented.  Last Vitals:  Vitals:   09/13/19 1250 09/13/19 1401  BP: (!) 125/51 113/63  Pulse: 74 81  Resp: 13 16  Temp:  36.5 C  SpO2: 100% 95%    Last Pain:  Vitals:   09/13/19 1401  TempSrc: Oral  PainSc:                  Audry Pili

## 2019-09-13 NOTE — Progress Notes (Signed)
    Comfortable B incisional vacs to suction Doppler PT/DP B LE  Stable disposition  [name] PA-C

## 2019-09-13 NOTE — Progress Notes (Signed)
Patient arrived to 4E room 04 at this time. Telemetry applied and CCMD notified. CHG bath done. V/s and assessment complete. Will continue to monitor.

## 2019-09-13 NOTE — Transfer of Care (Signed)
Immediate Anesthesia Transfer of Care Note  Patient: Helen Washington  Procedure(s) Performed: BILATERAL FEMORAL ENDARTERECTOMY with Patch Angioplasty (Bilateral ) INSERTION OF LEFT ILIAC STENT (Left ) PATCH ANGIOPLASTY Bilateral (Bilateral )  Patient Location: PACU  Anesthesia Type:General  Level of Consciousness: awake, alert  and oriented  Airway & Oxygen Therapy: Patient Spontanous Breathing and Patient connected to face mask oxygen  Post-op Assessment: Report given to RN and Post -op Vital signs reviewed and stable  Post vital signs: Reviewed and stable  Last Vitals:  Vitals Value Taken Time  BP 138/74   Temp    Pulse 95 09/13/19 1055  Resp 16 09/13/19 1055  SpO2 98 % 09/13/19 1055  Vitals shown include unvalidated device data.  Last Pain:  Vitals:   09/13/19 0653  TempSrc:   PainSc: 0-No pain         Complications: No complications documented.

## 2019-09-13 NOTE — Anesthesia Procedure Notes (Signed)
Arterial Line Insertion Start/End8/26/2021 7:10 AM, 09/13/2019 7:18 AM Performed by: Audry Pili, MD, Genelle Bal, CRNA, CRNA  Patient location: Pre-op. Preanesthetic checklist: patient identified, IV checked, site marked, risks and benefits discussed, surgical consent, monitors and equipment checked, pre-op evaluation, timeout performed and anesthesia consent Lidocaine 1% used for infiltration Right, radial was placed Catheter size: 20 Fr Hand hygiene performed  and maximum sterile barriers used   Attempts: 1 Procedure performed without using ultrasound guided technique. Following insertion, dressing applied. Post procedure assessment: normal and unchanged  Patient tolerated the procedure well with no immediate complications.

## 2019-09-13 NOTE — H&P (Signed)
Vascular and Vein Specialist of [facility]  Patient name: Helen Washington     MRN: [id number]        DOB: October 06, 1945            Sex: female   REASON FOR VISIT:    Follow up  Helen Washington:    Angelyn Osterberg is a 74 y.o. female who I have been following for claudication.  Her symptoms have progressed to the point where she has difficulty doing any daily activities.  She underwent angiogram recently which revealed bilateral femoral occlusion.  She is here today to discuss surgical treatment.   The patient suffers from white coat hypertension.  she takes a statin for hypercholesterolemia. The patient is a former smoker   PAST MEDICAL HISTORY:       Past Medical History:  Diagnosis Date  . Hyperlipidemia   . Hypertension   . Insomnia      FAMILY HISTORY:        Family History  Problem Relation Age of Onset  . Stroke Mother   . Arthritis Mother   . CVA Mother   . Diverticulitis Mother   . Cancer Father   . Hypertension Sister   . Diverticulosis Sister   . Breast cancer Cousin   . Suicidality Brother   . Hypertension Brother   . Anxiety disorder Brother   . Kidney Stones Brother   . Ulcers Brother   . Healthy Daughter     SOCIAL HISTORY:   Social History        Tobacco Use  . Smoking status: Former Smoker    Packs/day: 1.00    Years: 36.00    Pack years: 36.00    Types: Cigarettes    Quit date: 04/01/2000    Years since quitting: 19.1  . Smokeless tobacco: Never Used  Substance Use Topics  . Alcohol use: No     ALLERGIES:       Allergies  Allergen Reactions  . Codeine Nausea Only     CURRENT MEDICATIONS:         Current Outpatient Medications  Medication Sig Dispense Refill  . alendronate (FOSAMAX) 70 MG tablet Take 70 mg by mouth once a week. Take with a full glass of water on an empty stomach.    . Aspirin-Caffeine 845-65 MG PACK  Take 1 Package by mouth daily as needed (Headache).     . rosuvastatin (CRESTOR) 10 MG tablet Take 10 mg by mouth daily.    . Vitamin D, Cholecalciferol, 400 units TABS Take by mouth.     No current facility-administered medications for this visit.    REVIEW OF SYSTEMS:   [X]  denotes positive finding, [ ]  denotes negative finding Cardiac  Comments:  Chest pain or chest pressure:    Shortness of breath upon exertion:    Short of breath when lying flat:    Irregular heart rhythm:        Vascular    Pain in calf, thigh, or hip brought on by ambulation: x   Pain in feet at night that wakes you up from your sleep:     Blood clot in your veins:    Leg swelling:         Pulmonary    Oxygen  at home:    Productive cough:     Wheezing:         Neurologic    Sudden weakness in arms or legs:     Sudden numbness in arms or legs:     Sudden onset of difficulty speaking or slurred speech:    Temporary loss of vision in one eye:     Problems with dizziness:         Gastrointestinal    Blood in stool:     Vomited blood:         Genitourinary    Burning when urinating:     Blood in urine:        Psychiatric    Washington depression:         Hematologic    Bleeding problems:    Problems with blood clotting too easily:        Skin    Rashes or ulcers:        Constitutional    Fever or chills:      PHYSICAL EXAM:   There were no vitals filed for this visit.  GENERAL: The patient is a well-nourished female, in no acute distress. The vital signs are documented above. CARDIAC: There is a regular rate and rhythm.  VASCULAR: Nonpalpable pedal pulses PULMONARY: Non-labored respirations ABDOMEN: Soft and non-tender with normal pitched bowel sounds.  MUSCULOSKELETAL: There are no Washington deformities or cyanosis. NEUROLOGIC: No focal weakness or paresthesias are detected. SKIN:  There are no ulcers or rashes noted. PSYCHIATRIC: The patient has a normal affect.  STUDIES:   None  MEDICAL ISSUES:   Bilateral severe claudication: We discussed that she has near total occlusion of bilateral common femoral arteries as well as a high-grade left external iliac stenosis.  She will be scheduled for bilateral femoral endarterectomy with left iliac stent.  We discussed the risks and benefits of the operation as well as the recovery.  All of her questions were answered.  I will send her for cardiac clearance.  I will order a cardiac CTA.  Her procedure will be scheduled for late May    Helen Washington, IV, MD, FACS [facility] [phone] Pager [phone]     Still with  Severe claudication.  No ulcers CV:RRR PULM:CTA Skin:  No open wounds  Again discussed bilateral femoral endarterectomies and left iliac stent.  All questions answered.  WElls Kiyonna Tortorelli

## 2019-09-14 ENCOUNTER — Encounter (HOSPITAL_COMMUNITY): Payer: Self-pay | Admitting: Surgery

## 2019-09-14 ENCOUNTER — Inpatient Hospital Stay (HOSPITAL_COMMUNITY): Payer: Medicare HMO

## 2019-09-14 DIAGNOSIS — I739 Peripheral vascular disease, unspecified: Secondary | ICD-10-CM

## 2019-09-14 LAB — CBC
HCT: 26.6 % — ABNORMAL LOW (ref 36.0–46.0)
Hemoglobin: 8.9 g/dL — ABNORMAL LOW (ref 12.0–15.0)
MCH: 31.4 pg (ref 26.0–34.0)
MCHC: 33.5 g/dL (ref 30.0–36.0)
MCV: 94 fL (ref 80.0–100.0)
Platelets: 182 10*3/uL (ref 150–400)
RBC: 2.83 MIL/uL — ABNORMAL LOW (ref 3.87–5.11)
RDW: 12.8 % (ref 11.5–15.5)
WBC: 9.9 10*3/uL (ref 4.0–10.5)
nRBC: 0 % (ref 0.0–0.2)

## 2019-09-14 LAB — BASIC METABOLIC PANEL
Anion gap: 9 (ref 5–15)
BUN: 12 mg/dL (ref 8–23)
CO2: 24 mmol/L (ref 22–32)
Calcium: 8.7 mg/dL — ABNORMAL LOW (ref 8.9–10.3)
Chloride: 105 mmol/L (ref 98–111)
Creatinine, Ser: 0.97 mg/dL (ref 0.44–1.00)
GFR calc Af Amer: >60 mL/min (ref >60)
GFR calc non Af Amer: 58 mL/min — ABNORMAL LOW (ref >60)
Glucose, Bld: 129 mg/dL — ABNORMAL HIGH (ref 70–99)
Potassium: 4.4 mmol/L (ref 3.5–5.1)
Sodium: 138 mmol/L (ref 135–145)

## 2019-09-14 LAB — LIPID PANEL
Cholesterol: 143 mg/dL (ref 0–200)
HDL: 72 mg/dL (ref >40)
LDL Cholesterol: 58 mg/dL (ref 0–99)
Total CHOL/HDL Ratio: 2 RATIO
Triglycerides: 63 mg/dL (ref <150)
VLDL: 13 mg/dL (ref 0–40)

## 2019-09-14 NOTE — Evaluation (Signed)
Occupational Therapy Evaluation Patient Details Name: Helen Washington MRN: [id number] DOB: 10-31-1945 Today's Date: 09/14/2019    History of Present Illness 74 yo admitted with bil claudication s/p bil femoral endarterectomy. PMHx: HTN, HLD   Clinical Impression   PT admitted with bil femoral endartectomy . Pt currently with functional limitiations due to the deficits listed below (see OT problem list). Pt at adequate level to d/c home with family  Pt will benefit from skilled OT to increase their independence and safety with adls and balance to allow discharge home.     Follow Up Recommendations  No OT follow up    Equipment Recommendations  None recommended by OT    Recommendations for Other Services       Precautions / Restrictions Precautions Precaution Comments: wound vac x2 in groin area      Mobility Bed Mobility Overal bed mobility: Modified Independent                Transfers Overall transfer level: Modified independent                    Balance                                           ADL either performed or assessed with clinical judgement   ADL Overall ADL's : Needs assistance/impaired Eating/Feeding: Independent   Grooming: Modified independent               Lower Body Dressing: Maximal assistance Lower Body Dressing Details (indicate cue type and reason): don doff underwear. pt states i am gettin gmy daughter to get me some dresses if i have to take these home Toilet Transfer: Supervision/safety           Functional mobility during ADLs: Supervision/safety General ADL Comments: pt educated on adjusting the straps to the wound vac and plugging them in to charge. educated on lights to know when charge is required. pt educated on dressing with bil wound vacs with underwear with demo     Vision Baseline Vision/History: Wears glasses Wears Glasses: Reading only       Perception     Praxis      Pertinent  Vitals/Pain Pain Assessment: No/denies pain     Hand Dominance     Extremity/Trunk Assessment Upper Extremity Assessment Upper Extremity Assessment: Overall WFL for tasks assessed   Lower Extremity Assessment Lower Extremity Assessment: Defer to PT evaluation   Cervical / Trunk Assessment Cervical / Trunk Assessment: Normal   Communication Communication Communication: No difficulties   Cognition Arousal/Alertness: Awake/alert Behavior During Therapy: WFL for tasks assessed/performed Overall Cognitive Status: Within Functional Limits for tasks assessed                                     General Comments       Exercises     Shoulder Instructions      Home Living Family/patient expects to be discharged to:: Private residence Living Arrangements: Alone Available Help at Discharge: Family;Available PRN/intermittently Type of Home: House Home Access: Stairs to enter [employer] of Steps: 1   Home Layout: One level     Bathroom Shower/Tub: Teacher, early years/pre: Standard     Home Equipment: Cane - single point  Prior Functioning/Environment Level of Independence: Independent                 OT Problem List: Decreased strength;Decreased activity tolerance;Impaired balance (sitting and/or standing);Decreased knowledge of use of DME or AE;Decreased safety awareness;Decreased knowledge of precautions      OT Treatment/Interventions: Self-care/ADL training;Therapeutic exercise;DME and/or AE instruction;Therapeutic activities;Patient/family education;Balance training    OT Goals(Current goals can be found in the care plan section) Acute Rehab OT Goals Patient Stated Goal: to return home OT Goal Formulation: With patient Time For Goal Achievement: 09/28/19 Potential to Achieve Goals: Good  OT Frequency: Min 2X/week   Barriers to D/C:            Co-evaluation              AM-PAC OT "6 Clicks" Daily  Activity     Outcome Measure Help from another person eating meals?: None Help from another person taking care of personal grooming?: None Help from another person toileting, which includes using toliet, bedpan, or urinal?: A Little Help from another person bathing (including washing, rinsing, drying)?: A Little Help from another person to put on and taking off regular upper body clothing?: None Help from another person to put on and taking off regular lower body clothing?: A Little 6 Click Score: 21   End of Session Nurse Communication: Mobility status;Precautions  Activity Tolerance: Patient tolerated treatment well Patient left: in bed;with call bell/phone within reach  OT Visit Diagnosis: Unsteadiness on feet (R26.81);Muscle weakness (generalized) (M62.81)                Time: 2376-2831 OT Time Calculation (min): 11 min Charges:  OT General Charges $OT Visit: 1 Visit OT Evaluation $OT Eval Low Complexity: 1 Low   Brynn, OTR/L  Acute Rehabilitation Services Pager: [phone] Office: [phone] .   Jeri Modena 09/14/2019, 3:06 PM

## 2019-09-14 NOTE — Plan of Care (Signed)
Continue to monitor

## 2019-09-14 NOTE — Progress Notes (Addendum)
Vascular and Vein Specialists of Helen Washington  Subjective  - N/V  X 2. She is not requiring pain medication.   Objective (!) 106/59 80 98 F (36.7 C) (Oral) 19 96%  Intake/Output Summary (Last 24 hours) at 09/14/2019 0758 Last data filed at 09/14/2019 0400 Gross per 24 hour  Intake 2626.24 ml  Output 1375 ml  Net 1251.24 ml    Prevena vacs in place to suction no drainage Heart RRR Lungs non labored breathing B groin soft surrounding vacs, no hematoma Doppler DP/PT B intact  Assessment/Planning: POD # 1 Procedure:   #1: Right femoral endarterectomy with bovine pericardial patch angioplasty                         #2: Left femoral endarterectomy with bovine pericardial patch angioplasty                         #3: First order catheterization                         #4: Right lower extremity angiogram                         #5: Left external iliac artery stent                         #6: Bilateral Prevena wound VAC If we can get the N/V under control she may be discharged today will watch her over the next few hours.  Ambulating in halls without difficulties. Discharge with incisional vacs Blood loss anemia asymptomatic  Helen Washington 09/14/2019 7:58 AM --  Laboratory Lab Results: Recent Labs    09/13/19 1628 09/14/19 0229  WBC 10.1 9.9  HGB 10.5* 8.9*  HCT 30.9* 26.6*  PLT 199 182   BMET Recent Labs    09/13/19 1628 09/14/19 0229  NA  --  138  K  --  4.4  CL  --  105  CO2  --  24  GLUCOSE  --  129*  BUN  --  12  CREATININE 0.98 0.97  CALCIUM  --  8.7*    COAG Lab Results  Component Value Date   INR 1.0 09/10/2019   No results found for: PTT

## 2019-09-14 NOTE — Progress Notes (Signed)
ABI study completed.   See CVProc for preliminary results.   Zury Fazzino, RDMS, RVT 

## 2019-09-14 NOTE — Evaluation (Signed)
Physical Therapy Evaluation Patient Details Name: Helen Washington MRN: [id number] DOB: June 22, 1945 Today's Date: 09/14/2019   History of Present Illness  74 yo admitted with bil claudication s/p bil femoral endarterectomy. PMHx: HTN, HLD  Clinical Impression  Pt very pleasant and eager to get OOB. Pt with bil groin soreness from incision and hesitant at first to ambulate without assist. Pt educated for strapping on VAC and able to wear straps crossbody for mobility. Pt with decreased activity tolerance and strength due to pain who will benefit from acute therapy to maximize independence but do not anticipate needs outside the acute setting. Pt educated for HEP and walking program with pt encouraged to walk in hall daily and to bathroom.     Follow Up Recommendations No PT follow up    Equipment Recommendations  None recommended by PT    Recommendations for Other Services       Precautions / Restrictions Precautions Precautions: None      Mobility  Bed Mobility Overal bed mobility: Modified Independent                Transfers Overall transfer level: Modified independent                  Ambulation/Gait Ambulation/Gait assistance: Supervision Gait Distance (Feet): 350 Feet Assistive device: 1 person hand held assist;None Gait Pattern/deviations: Step-through pattern;Decreased stride length   Gait velocity interpretation: >2.62 ft/sec, indicative of community ambulatory General Gait Details: pt initiating gait with single UE support HHA and walked 280' with HHA without LOB. Pt then able to walk without assist remainder of gait with good stability and slightly decreased stride  Stairs            Wheelchair Mobility    Modified Rankin (Stroke Patients Only)       Balance Overall balance assessment: Mild deficits observed, not formally tested                                           Pertinent Vitals/Pain Pain Assessment: 0-10 Pain  Score: 2  Pain Location: bil groin incision Pain Descriptors / Indicators: Sore Pain Intervention(s): Limited activity within patient's tolerance;Monitored during session    Home Living Family/patient expects to be discharged to:: Private residence Living Arrangements: Alone Available Help at Discharge: Family;Available PRN/intermittently Type of Home: House Home Access: Stairs to enter   [employer] of Steps: 1 Home Layout: One level Home Equipment: Cane - single point      Prior Function Level of Independence: Independent               Hand Dominance        Extremity/Trunk Assessment   Upper Extremity Assessment Upper Extremity Assessment: Overall WFL for tasks assessed    Lower Extremity Assessment Lower Extremity Assessment: Overall WFL for tasks assessed    Cervical / Trunk Assessment Cervical / Trunk Assessment: Normal  Communication   Communication: No difficulties  Cognition Arousal/Alertness: Awake/alert Behavior During Therapy: WFL for tasks assessed/performed Overall Cognitive Status: Within Functional Limits for tasks assessed                                        General Comments      Exercises General Exercises - Lower Extremity Long Arc Quad: AROM;Both;15 reps;Seated Hip  Flexion/Marching: AROM;Both;15 reps;Seated   Assessment/Plan    PT Assessment Patient needs continued PT services  PT Problem List Decreased mobility;Decreased activity tolerance;Pain       PT Treatment Interventions Therapeutic activities;Gait training;Therapeutic exercise;Patient/family education;Functional mobility training    PT Goals (Current goals can be found in the Care Plan section)  Acute Rehab PT Goals Patient Stated Goal: return home and to water aerobics PT Goal Formulation: With patient Time For Goal Achievement: 09/28/19 Potential to Achieve Goals: Good    Frequency Min 3X/week   Barriers to discharge         Co-evaluation               AM-PAC PT "6 Clicks" Mobility  Outcome Measure Help needed turning from your back to your side while in a flat bed without using bedrails?: None Help needed moving from lying on your back to sitting on the side of a flat bed without using bedrails?: None Help needed moving to and from a bed to a chair (including a wheelchair)?: None Help needed standing up from a chair using your arms (e.g., wheelchair or bedside chair)?: None Help needed to walk in hospital room?: A Little Help needed climbing 3-5 steps with a railing? : A Little 6 Click Score: 22    End of Session   Activity Tolerance: Patient tolerated treatment well Patient left: in chair;with call bell/phone within reach Nurse Communication: Mobility status PT Visit Diagnosis: Other abnormalities of gait and mobility (R26.89)    Time: 0045-9977 PT Time Calculation (min) (ACUTE ONLY): 20 min   Charges:   PT Evaluation $PT Eval Moderate Complexity: 1 Mod          Kiven Vangilder P, PT Acute Rehabilitation Services Pager: [phone] Office: [phone]   Caoilainn Sacks B Tyiana Hill 09/14/2019, 11:01 AM

## 2019-09-14 NOTE — Op Note (Signed)
    Patient name: Helen Washington MRN: [id number] DOB: [birth year] Sex: female  Pre-operative Diagnosis: Bilateral claudication Post-operative diagnosis:  Same Surgeon:  Servando Snare, MD Co-surgeon: [name], MD Assistants: Laurence Slate, PA Procedure:   #1: Right femoral endarterectomy with bovine pericardial patch angioplasty                         Indications: Patient has bilateral lower extremity claudication with bilateral common femoral artery occlusions and left external iliac artery high-grade stenosis.  Findings: Common femoral on the right side was occluded with heavy calcific plaque.  The profunda and the medial circumflex branch were occluded.  The SFA was occluded for approximately 2 cm past its origin.  External leg artery was soft and amenable to clamping.  After endarterectomy we had very strong inflow good backbleeding from the profunda as well as the SFA.  After patch angioplasty there was strong signal in both the profunda and the SFA.   Procedure:  The patient was identified in the holding area and taken to the operating room where she is placed supine operative when general anesthesia was induced antibiotics were minister timeout was called.  Full operative dictated note by Dr. Trula Slade.  Right common femoral artery transverse incision was made the inguinal ligament was exposed.  The common femoral artery was identified.  We dissected under the inguinal ligament placed Vesseloops around multiple side branches and around the external iliac artery where it was soft.  We dissected out the medial circumflex was a large branch as well as the profunda and the SFA and Vesseloops were placed around this.  Patient was fully heparinized.  Outflow followed by inflow was clamped.  We open the common femoral artery longitudinally up to the external leg artery down to the SFA.  We performed endarterectomy including eversion of the profunda.  We had a good endpoint on the SFA.  1 area of the SFA  did tear this was repaired with 6-0 Prolene running suture.  We had good backbleeding from the SFA there was very strong inflow from the external leg artery.  We prepared a bovine pericardial patch and sewed it in place.  Prior to completion we allowed flushing all directions upon completion there was very good pulse in both the profunda and the SFA.  Unfortunately where the artery had previously been repaired there was some bleeding this was repaired with interrupted Prolene suture.  When hemostasis was obtained we then demonstrated very good Doppler signals in the SFA and profunda there was a strong pulse in the common femoral artery.  Protamine was administered after Dr. Trula Slade completed the left side.  The wound was irrigated and closed in layers with Vicryl and Dermabond was placed to the level of the skin.   EBL: 200cc  [name] C. Donzetta Matters, MD [facility] [place] Office: [phone] Pager: [phone]

## 2019-09-15 MED ORDER — TRAMADOL HCL 50 MG PO TABS
50.0000 mg | ORAL_TABLET | Freq: Four times a day (QID) | ORAL | 0 refills | Status: DC | PRN
Start: 2019-09-15 — End: 2019-11-01

## 2019-09-15 NOTE — Progress Notes (Signed)
Order received to discharge patient.  Telemetry monitor removed and CCMD notified.  PIV access removed.  Discharge instructions, follow up, medications and instructions for their use discussed with patient. 

## 2019-09-15 NOTE — Progress Notes (Addendum)
  Progress Note    09/15/2019 8:06 AM 2 Days Post-Op  Subjective:  Denies any nausea or vomiting this morning. Ordering breakfast when I entered room. Very minimal pain   Vitals:   09/14/19 2311 09/15/19 0408  BP: 119/64 97/61  Pulse: 100 100  Resp: 20 19  Temp: 98.6 F (37 C) 98.6 F (37 C)  SpO2: 92% 95%   Physical Exam: Cardiac:  Regular rate and rhythm Lungs: non labored Incisions:  Bilateral groins with prevena VACs with good suction, otherwise soft without hematoma Extremities: bilateral lower extremities well perfused and warm. Doppler DP/ PT signals bilaterally Abdomen:  Soft non tender Neurologic: alert and oriented  CBC    Component Value Date/Time   WBC 9.9 09/14/2019 0229   RBC 2.83 (L) 09/14/2019 0229   HGB 8.9 (L) 09/14/2019 0229   HCT 26.6 (L) 09/14/2019 0229   PLT 182 09/14/2019 0229   MCV 94.0 09/14/2019 0229   MCH 31.4 09/14/2019 0229   MCHC 33.5 09/14/2019 0229   RDW 12.8 09/14/2019 0229    BMET    Component Value Date/Time   NA 138 09/14/2019 0229   NA 139 06/04/2019 1632   K 4.4 09/14/2019 0229   CL 105 09/14/2019 0229   CO2 24 09/14/2019 0229   GLUCOSE 129 (H) 09/14/2019 0229   BUN 12 09/14/2019 0229   BUN 16 06/04/2019 1632   CREATININE 0.97 09/14/2019 0229   CALCIUM 8.7 (L) 09/14/2019 0229   GFRNONAA 58 (L) 09/14/2019 0229   GFRAA >60 09/14/2019 0229    INR    Component Value Date/Time   INR 1.0 09/10/2019 0851     Intake/Output Summary (Last 24 hours) at 09/15/2019 0806 Last data filed at 09/15/2019 0500 Gross per 24 hour  Intake 240 ml  Output --  Net 240 ml     Assessment/Plan:  74 y.o. female is s/p  #1: Right femoral endarterectomy with bovine pericardial patch angioplasty #2: Left femoral endarterectomy with bovine pericardial patch angioplasty #3: First order catheterization #4: Right lower extremity angiogram #5:  Left external iliac artery stent #6: Bilateral Prevena wound VAC N/v resolved will make sure she tolerates her am meds and breakfast.  Ambulating in halls without difficulties. She will Discharge with incisional vacs. Pending morning labs. Blood loss anemia asymptomatic. PT/OT no follow up recommendations. 2 Days Post-Op. Stable for discharge home latera today pending labs and if she tolerates diet this morning   DVT prophylaxis:  SCDs, sq heparin   Karoline Caldwell, PA-C [facility] [phone] 09/15/2019 8:06 AM   I have independently interviewed and examined patient and agree with PA assessment and plan above.   Jameshia Hayashida C. Donzetta Matters, MD [facility] [place] Office: [phone] Pager: [phone]

## 2019-09-15 NOTE — Discharge Instructions (Signed)
   [facility]  Discharge Instructions  Lower Extremity Angiogram; Angioplasty/Stenting  Please refer to the following instructions for your post-procedure care. Your surgeon or physician assistant will discuss any changes with you.  Activity  Avoid lifting more than 8 pounds (1 gallons of milk) for 72 hours (3 days) after your procedure. You may walk as much as you can tolerate. It's OK to drive after 72 hours.  Bathing/Showering  You may shower the day after your procedure. If you have a bandage, you may remove it at 24- 48 hours. Clean your incision site with mild soap and water. Pat the area dry with a clean towel.  Keep Pravena wound vac on your groin incision until it loses it seal in about 7-10 days.  Once that happens, you can remove and then wash the groin wound with soap and water daily and pat dry. (No tub bath-only shower)  Then put a dry gauze or washcloth in the groin to keep this area dry to help prevent wound infection.  Do this daily and as needed.  Do not use Vaseline or neosporin on your incisions.  Only use soap and water on your incisions and then protect and keep dry.   Diet  Resume your pre-procedure diet. There are no special food restrictions following this procedure. All patients with peripheral vascular disease should follow a low fat/low cholesterol diet. In order to heal from your surgery, it is CRITICAL to get adequate nutrition. Your body requires vitamins, minerals, and protein. Vegetables are the best source of vitamins and minerals. Vegetables also provide the perfect balance of protein. Processed food has little nutritional value, so try to avoid this.  Medications  Resume taking all of your medications unless your doctor tells you not to. If your incision is causing pain, you may take over-the-counter pain relievers such as acetaminophen (Tylenol)  Follow Up  Follow up will be arranged at the time of your procedure. You  may have an office visit scheduled or may be scheduled for surgery. Ask your surgeon if you have any questions.  Please call us immediately for any of the following conditions: .Severe or worsening pain your legs or feet at rest or with walking. .Increased pain, redness, drainage at your groin puncture site. .Fever of 101 degrees or higher. .If you have any mild or slow bleeding from your puncture site: lie down, apply firm constant pressure over the area with a piece of gauze or a clean wash cloth for 30 minutes- no peeking!, call 911 right away if you are still bleeding after 30 minutes, or if the bleeding is heavy and unmanageable.  Reduce your risk factors of vascular disease:  Stop smoking. If you would like help call [facility] at 1-800-QUIT-NOW 667-830-9938) or [place] at 2083733439. Manage your cholesterol Maintain a desired weight Control your diabetes Keep your blood pressure down  If you have any questions, please call the office at [phone]

## 2019-09-15 NOTE — Progress Notes (Signed)
Discharged to home with family office visits in place teaching done  

## 2019-09-15 NOTE — Discharge Summary (Signed)
Bypass Discharge Summary Patient ID: Helen Washington [id number] y.o. 30-Aug-1945  Admit date: 09/13/2019  Discharge date and time: No discharge date for patient encounter.   Admitting Physician: Serafina Mitchell, MD   Discharge Physician: Serafina Mitchell, MD  Admission Diagnoses: PAD (peripheral artery disease) [facility]) [I73.9]  Discharge Diagnoses: PAD  Admission Condition: fair  Discharged Condition: good  Indication for Admission: lifestyle limiting claudication of bilateral lower extremities  Hospital Course: Patient was admitted on 09/13/2019 and was taken to the operating room and underwent : #1: Right femoral endarterectomy with bovine pericardial patch angioplasty                       #2: Left femoral endarterectomy with bovine pericardial patch angioplasty                         #3: First order catheterization                         #4: Right lower extremity angiogram                         #5: Left external iliac artery stent                         #6: Bilateral Prevena wound VAC She tolerated the procedure well and was transferred to the recovery room instable condition.  She continued to do well in PACU and was transferred to the floor in stable condition. Bilateral incision vacs to suction and bilateral lower extremities well perfused with doppler signals  POD #1 she had some nausea and vomiting likely secondary to anesthesia. Pain well controlled. Bilateral Prevena wound VACS on groins with good suction. Bilateral legs well perfused and warm. Able to ambulate without difficulty.PT/ OT eval and do not recommend any needed follow up  POD#2 did well over night. Nausea and vomiting resolved. Pain remains well controlled. Prevena vacs in place with good suction. Bilateral lower extremities well perfused with Doppler PT/ DP signals. Tolerating diet. Voiding without difficulty. She is stable for discharge home. She will go home with her [place]. She will continue her  Aspirin and statin. She will follow up with Dr. Trula Slade in 2 weeks  Consults: None  Treatments: surgery:  #1: Right femoral endarterectomy with bovine pericardial patch angioplasty                         #2: Left femoral endarterectomy with bovine pericardial patch angioplasty                         #3: First order catheterization                         #4: Right lower extremity angiogram                         #5: Left external iliac artery stent                         #6: Bilateral Prevena wound VAC   Disposition: Discharge disposition: 01-Home or Self Care       - For [facility] Registry use ---  Post-op:  Wound infection: No  Graft infection: No  Transfusion: No  If yes, 0 units given New Arrhythmia: No Patency judged by: [ ]  Dopper only, [ ]  Palpable graft pulse, [ ]  Palpable distal pulse, [ ]  ABI inc. > 0.15, [ ]  Duplex Discharge ABI: R 0.80, L 0.69 Discharge TBI: R 0.50 , L 0.24 D/C Ambulatory Status: Ambulatory  Complications: MI: [ ]  No, [ ]  Troponin only, [ ]  EKG or Clinical CHF: No Resp failure: [ ]  none, [ ]  Pneumonia, [ ]  Ventilator Chg in renal function: [ ]  none, [ ]  Inc. Cr > 0.5, [ ]  Temp. Dialysis, [ ]  Permanent dialysis Stroke: [ ]  None, [ ]  Minor, [ ]  Major Return to OR: No  Reason for return to OR: [ ]  Bleeding, [ ]  Infection, [ ]  Thrombosis, [ ]  Revision  Discharge medications: Statin use:  Yes ASA use:  Yes Plavix use:  No  for medical reason not indicated Beta blocker use: Yes Coumadin use: No  for medical reason not indicated    Patient Instructions:  Allergies as of 09/15/2019       Reactions   Codeine Nausea Only        Medication List     TAKE these medications    alendronate 70 MG tablet Commonly known as: FOSAMAX Take 70 mg by mouth once a week. Take with a full glass of water on an empty stomach.   aspirin EC 81 MG tablet Take 1 tablet (81 mg total) by mouth daily.   carvedilol 3.125 MG tablet Commonly known as:  COREG Take 1 tablet (3.125 mg total) by mouth 2 (two) times daily.   losartan 25 MG tablet Commonly known as: COZAAR Take 1 tablet (25 mg total) by mouth daily.   rosuvastatin 20 MG tablet Commonly known as: CRESTOR Take 1 tablet (20 mg total) by mouth daily.   traMADol 50 MG tablet Commonly known as: Ultram Take 1 tablet (50 mg total) by mouth every 6 (six) hours as needed.   Vitamin D (Cholecalciferol) 25 MCG (1000 UT) Tabs Take 1,000 Units by mouth daily.       Activity: activity as tolerated Diet: regular diet , heart healthy Wound Care: keep wound clean and dry and as directed  Prevena wound VAC instructions provided for patient  Follow-up with Dr. Trula Slade in 2 weeks.  Signed: Karoline Caldwell 09/15/2019 8:39 AM

## 2019-09-19 ENCOUNTER — Telehealth: Payer: Self-pay

## 2019-09-19 NOTE — Telephone Encounter (Signed)
Pt's daughter Seth Bake) called with questions of when to take off pt's Prevena woundvac. Per PA, they can remove woundvac when battery dies. I have left her a message with this information, per Andrea's request to do so. Additionally, I have let them know to call us if there is any redness/odo/drainage, etc.

## 2019-09-28 DIAGNOSIS — Z1159 Encounter for screening for other viral diseases: Secondary | ICD-10-CM | POA: Diagnosis not present

## 2019-10-01 ENCOUNTER — Other Ambulatory Visit: Payer: Self-pay

## 2019-10-01 ENCOUNTER — Encounter: Payer: Self-pay | Admitting: Surgery

## 2019-10-01 ENCOUNTER — Ambulatory Visit (INDEPENDENT_AMBULATORY_CARE_PROVIDER_SITE_OTHER): Payer: Self-pay | Admitting: Surgery

## 2019-10-01 VITALS — BP 126/69 | HR 55 | Temp 98.0°F | Resp 20 | Ht 59.0 in | Wt 121.0 lb

## 2019-10-01 DIAGNOSIS — I70213 Atherosclerosis of native arteries of extremities with intermittent claudication, bilateral legs: Secondary | ICD-10-CM

## 2019-10-01 NOTE — Progress Notes (Signed)
° °  Patient name: Helen Washington MRN: [id number] DOB: 05/10/1945 Sex: female  REASON FOR VISIT:     post op  HISTORY OF PRESENT ILLNESS:   Helen Washington is a 73 y.o. female who suffers from severe bilateral claudication.  On 09/13/2019 she underwent bilateral femoral endarterectomy with bovine pericardial patch angioplasty as well as a left external iliac stent.  She feels her walking is significantly improved.  She denies any leg swelling  The patient suffers from white coat hypertension.  she takes a statin for hypercholesterolemia. The patient is a former smoker   CURRENT MEDICATIONS:    Current Outpatient Medications  Medication Sig Dispense Refill   alendronate (FOSAMAX) 70 MG tablet Take 70 mg by mouth once a week. Take with a full glass of water on an empty stomach.     aspirin EC 81 MG tablet Take 1 tablet (81 mg total) by mouth daily. 90 tablet 3   carvedilol (COREG) 3.125 MG tablet Take 1 tablet (3.125 mg total) by mouth 2 (two) times daily. 180 tablet 3   losartan (COZAAR) 25 MG tablet Take 1 tablet (25 mg total) by mouth daily. 30 tablet 3   rosuvastatin (CRESTOR) 20 MG tablet Take 1 tablet (20 mg total) by mouth daily. 90 tablet 3   traMADol (ULTRAM) 50 MG tablet Take 1 tablet (50 mg total) by mouth every 6 (six) hours as needed. 15 tablet 0   Vitamin D, Cholecalciferol, 25 MCG (1000 UT) TABS Take 1,000 Units by mouth daily.      No current facility-administered medications for this visit.    REVIEW OF SYSTEMS:   [X]  denotes positive finding, [ ]  denotes negative finding Cardiac  Comments:  Chest pain or chest pressure:    Shortness of breath upon exertion:    Short of breath when lying flat:    Irregular heart rhythm:    Constitutional    Fever or chills:      PHYSICAL EXAM:   Vitals:   10/01/19 1056  BP: 126/69  Pulse: (!) 55  Resp: 20  Temp: 98 F (36.7 C)  SpO2: 95%  Weight: 121 lb (54.9 kg)  Height: 4\' 11"   (1.499 m)    GENERAL: The patient is a well-nourished female, in no acute distress. The vital signs are documented above. CARDIOVASCULAR: There is a regular rate and rhythm. PULMONARY: Non-labored respirations Some fullness in both groins, suggesting a healing ridge or small seroma.  Pedal pulses are nonpalpable.  Incisions are well-healed  STUDIES:   None   MEDICAL ISSUES:   Status post bilateral femoral endarterectomies.  The patient doing very well at this time.  I have told her to wait 6 weeks before starting back to her water aerobics, and before she does, make sure there is no separation in her groin incisions.  She will follow-up with me in 3 months for duplex of her endarterectomy repairs and ABIs.  Leia Alf, MD, FACS [facility] [phone] Pager [phone]

## 2019-10-03 ENCOUNTER — Other Ambulatory Visit: Payer: Self-pay | Admitting: *Deleted

## 2019-10-03 DIAGNOSIS — I739 Peripheral vascular disease, unspecified: Secondary | ICD-10-CM

## 2019-10-03 DIAGNOSIS — I70213 Atherosclerosis of native arteries of extremities with intermittent claudication, bilateral legs: Secondary | ICD-10-CM

## 2019-10-09 DIAGNOSIS — G4733 Obstructive sleep apnea (adult) (pediatric): Secondary | ICD-10-CM | POA: Diagnosis not present

## 2019-10-19 ENCOUNTER — Other Ambulatory Visit: Payer: Self-pay | Admitting: Cardiology

## 2019-10-26 DIAGNOSIS — Z7722 Contact with and (suspected) exposure to environmental tobacco smoke (acute) (chronic): Secondary | ICD-10-CM | POA: Diagnosis not present

## 2019-10-26 DIAGNOSIS — G4733 Obstructive sleep apnea (adult) (pediatric): Secondary | ICD-10-CM | POA: Diagnosis not present

## 2019-10-26 DIAGNOSIS — I739 Peripheral vascular disease, unspecified: Secondary | ICD-10-CM | POA: Diagnosis not present

## 2019-10-26 DIAGNOSIS — I10 Essential (primary) hypertension: Secondary | ICD-10-CM | POA: Diagnosis not present

## 2019-10-26 DIAGNOSIS — E785 Hyperlipidemia, unspecified: Secondary | ICD-10-CM | POA: Diagnosis not present

## 2019-10-26 DIAGNOSIS — Z7983 Long term (current) use of bisphosphonates: Secondary | ICD-10-CM | POA: Diagnosis not present

## 2019-10-26 DIAGNOSIS — M810 Age-related osteoporosis without current pathological fracture: Secondary | ICD-10-CM | POA: Diagnosis not present

## 2019-10-26 DIAGNOSIS — Z7982 Long term (current) use of aspirin: Secondary | ICD-10-CM | POA: Diagnosis not present

## 2019-10-26 DIAGNOSIS — Z85828 Personal history of other malignant neoplasm of skin: Secondary | ICD-10-CM | POA: Diagnosis not present

## 2019-10-26 DIAGNOSIS — Z8249 Family history of ischemic heart disease and other diseases of the circulatory system: Secondary | ICD-10-CM | POA: Diagnosis not present

## 2019-10-28 NOTE — Progress Notes (Signed)
Cardiology Office Note:    Date:  11/01/2019   ID:  Helen Washington, DOB [birth year], MRN [id number]  PCP:  Helen Solian, MD  Cardiologist:  No primary care provider on file.  Electrophysiologist:  None   Referring MD: Helen Solian, MD   Chief Complaint  Patient presents with  . Coronary Artery Disease    History of Present Illness:    Helen Washington is a 74 y.o. female with a hx of hypertension, hyperlipidemia, PAD who presents for follow-up.  She was referred by Dr. Trula Slade for preop evaluation prior to bilateral femoral endarterectomy.  She was seen by Dr. Trula Slade for evaluation of claudication.  Angiogram on 05/15/2019 showed bilateral femoral artery occlusion, bilateral near occlusive popliteal stenosis.  Reports she has pain in her legs with minimal exertion, which limits her activity.  Up until 1 month ago she was doing water aerobics 3 times per week.  Would be in the pool for 3 hours.  She denies any exertional chest pain or dyspnea.  She quit smoking in 2002, smoked 1 pack/day x 45 years.  Denies any history of heart disease in her immediate family.  Coronary CTA on 06/30/2019 showed calcium score 1503 (97 percentile).  CT FFR showed severe stenosis in small nondominant circumflex artery (was read as occluded mid LAD and mid RCA on FFR but reviewed with Dr Meda Coffee and appears to not be occluded but rather not modeled on FFR due to small vessel size <1.80mm.  Lexiscan Myoview on 08/24/2019 showed large perfusion defect in basal to apical inferior/inferoseptal, minimal reversibility, EF 42%.  Echocardiogram on 08/29/2019 showed LVEF 45 to 09%, grade 1 diastolic dysfunction, RVSP 43 mmHg, moderate MR.  Since last clinic visit, she reports that she has been doing well. She reports she is walking much better since her bilateral femoral endarterectomy. She is planning to start water aerobics again soon, will exercise for 2 hours 3 times per week once she starts. She denies any chest pain,  dyspnea, lightheadedness, syncope, palpitations, lower extremity edema. Reports BP is usually 110s to 120s at home, but does state she has had BP down to 80s.  BP Readings from Last 3 Encounters:  11/01/19 114/64  10/01/19 126/69  09/15/19 108/72      Past Medical History:  Diagnosis Date  . Arthritis   . Complication of anesthesia    woke up during colonoscopy  . Hyperlipidemia   . Hypertension   . Insomnia   . Sleep apnea     Past Surgical History:  Procedure Laterality Date  . ABDOMINAL AORTOGRAM W/LOWER EXTREMITY Bilateral 05/15/2019   Procedure: ABDOMINAL AORTOGRAM W/LOWER EXTREMITY;  Surgeon: Serafina Mitchell, MD;  Location: [place] [facility];  Service: Cardiovascular;  Laterality: Bilateral;  . CATARACT EXTRACTION, BILATERAL  12/2015;01/2016   [facility]  . COLONOSCOPY    . ENDARTERECTOMY FEMORAL Bilateral 09/13/2019   Procedure: BILATERAL FEMORAL ENDARTERECTOMY with Patch Angioplasty;  Surgeon: Serafina Mitchell, MD;  Location: [place];  Service: Vascular;  Laterality: Bilateral;  . INSERTION OF ILIAC STENT Left 09/13/2019   Procedure: INSERTION OF LEFT ILIAC STENT;  Surgeon: Serafina Mitchell, MD;  Location: [place];  Service: Vascular;  Laterality: Left;  . PATCH ANGIOPLASTY Bilateral 09/13/2019   Procedure: PATCH ANGIOPLASTY Bilateral;  Surgeon: Serafina Mitchell, MD;  [facility];  Service: Vascular;  Laterality: Bilateral;  . TUBAL LIGATION  1969    Current Medications: Current Meds  Medication Sig  . alendronate (FOSAMAX) 70  MG tablet Take 70 mg by mouth once a week. Take with a full glass of water on an empty stomach.  [name] aspirin EC 81 MG tablet Take 1 tablet (81 mg total) by mouth daily.  . carvedilol (COREG) 3.125 MG tablet Take 1 tablet (3.125 mg total) by mouth 2 (two) times daily.  [name] losartan (COZAAR) 25 MG tablet Take 1 tablet (25 mg total) by mouth daily.  . rosuvastatin (CRESTOR) 20 MG tablet Take 1 tablet (20 mg total) by mouth  daily.  . Vitamin D, Cholecalciferol, 25 MCG (1000 UT) TABS Take 1,000 Units by mouth daily.      Allergies:   Codeine   Social History   Socioeconomic History  . Marital status: Divorced    Spouse name: Not on file  . Number of children: 1  . Years of education: 44  . Highest education level: Not on file  Occupational History    Comment: retired Scientist, clinical (histocompatibility and immunogenetics), typist  Tobacco Use  . Smoking status: Former Smoker    Packs/day: 1.00    Years: 36.00    Pack years: 36.00    Types: Cigarettes    Quit date: 04/01/2000    Years since quitting: 19.5  . Smokeless tobacco: Never Used  Vaping Use  . Vaping Use: Never used  Substance and Sexual Activity  . Alcohol use: Yes    Comment: 1 glass wine per week  . Drug use: No  . Sexual activity: Not on file  Other Topics Concern  . Not on file  Social History Narrative   Lives with room mate   Caffeine - coffee, 1 cup daily   Social Determinants of Health   Financial Resource Strain:   . Difficulty of Paying Living Expenses: Not on file  Food Insecurity:   . Worried About Charity fundraiser in the Last Year: Not on file  . Ran Out of Food in the Last Year: Not on file  Transportation Needs:   . Lack of Transportation (Medical): Not on file  . Lack of Transportation (Non-Medical): Not on file  Physical Activity:   . Days of Exercise per Week: Not on file  . Minutes of Exercise per Session: Not on file  Stress:   . Feeling of Stress : Not on file  Social Connections:   . Frequency of Communication with Friends and Family: Not on file  . Frequency of Social Gatherings with Friends and Family: Not on file  . Attends Religious Services: Not on file  . Active Member of Clubs or Organizations: Not on file  . Attends Archivist Meetings: Not on file  . Marital Status: Not on file     Family History: The patient's family history includes Anxiety disorder in her brother; Arthritis in her mother; Breast cancer in her cousin;  CVA in her mother; Cancer in her father; Diverticulitis in her mother; Diverticulosis in her sister; Healthy in her daughter; Hypertension in her brother and sister; Kidney Stones in her brother; Stroke in her mother; Suicidality in her brother; Ulcers in her brother.  ROS:   Please see the history of present illness.     All other systems reviewed and are negative.  EKGs/Labs/Other Studies Reviewed:    The following studies were reviewed today:   EKG:  EKG is not ordered today.  The ekg ordered most recently demonstrates sinus rhythm, PVC, nonspecific T wave flattening, less than 1 mm ST depression in V6  Recent Labs: 09/10/2019: ALT 14 09/14/2019:  BUN 12; Creatinine, Ser 0.97; Hemoglobin 8.9; Platelets 182; Potassium 4.4; Sodium 138  Recent Lipid Panel    Component Value Date/Time   CHOL 143 09/14/2019 0229   TRIG 63 09/14/2019 0229   HDL 72 09/14/2019 0229   CHOLHDL 2.0 09/14/2019 0229   VLDL 13 09/14/2019 0229   LDLCALC 58 09/14/2019 0229    Physical Exam:    VS:  BP 114/64   Pulse 62   Ht 4\' 11"  (1.499 m)   Wt 121 lb 9.6 oz (55.2 kg)   SpO2 92%   BMI 24.56 kg/m     Wt Readings from Last 3 Encounters:  11/01/19 121 lb 9.6 oz (55.2 kg)  10/01/19 121 lb (54.9 kg)  09/13/19 121 lb (54.9 kg)     GEN:  in no acute distress HEENT: Normal NECK: No JVD; left carotid bruit CARDIAC: RRR, no murmurs, rubs, gallops RESPIRATORY:  Clear to auscultation without rales, wheezing or rhonchi  ABDOMEN: Soft, non-tender, non-distended MUSCULOSKELETAL:  No edema; No deformity  SKIN: Warm and dry NEUROLOGIC:  Alert and oriented x 3 PSYCHIATRIC:  Normal affect   ASSESSMENT:    1. Coronary artery disease involving native coronary artery of native heart without angina pectoris   2. Systolic dysfunction   3. Essential hypertension   4. Hyperlipidemia, unspecified hyperlipidemia type   5. Mitral valve insufficiency, unspecified etiology   6. Carotid bruit, unspecified laterality     PLAN:    Coronary artery disease:Coronary CTA on 06/30/2019 showed calcium score 1503 (97 percentile).  CT FFR showed severe stenosis in small nondominant circumflex artery (was read as occluded mid LAD and mid RCA on FFR but reviewed with Dr Meda Coffee and appears to not be occluded but rather not modeled on FFR due to small vessel size <1.55mm). Denies any anginal symptoms.  Lexiscan Myoview on 08/24/2019 showed large perfusion defect in basal to apical inferior/inferoseptal, minimal reversibility, EF 42%.  Echocardiogram on 08/29/2019 showed LVEF 45 to 96%, grade 1 diastolic dysfunction, RVSP 43 mmHg, moderate MR. -Continue aspirin 81 mg daily -Continue rosuvastatin 20 mg daily  Systolic dysfunction: EF 45 to 50% on echocardiogram on 08/29/2019 -Continue carvedilol 3.125 mg twice daily -Continue losartan 25 mg daily -Normotensive in clinic today, but reports soft BPs at home, will hold off on increasing medications at this time  Miltral regurgitation: Moderate on echocardiogram 08/29/2019.  Repeat echocardiogram in 1 year for monitoring  PAD: Angiogram on 05/15/2019 showed bilateral femoral artery occlusion, bilateral near occlusive popliteal stenosis.  Status post bilateral femoral endarterectomy on 09/13/2019. Continue aspirin and rosuvastatin.  Hypertension: On carvedilol 3.25 mg twice daily and losartan 25 mg daily  Hyperlipidemia: LDL 58 on 09/14/2019.  Continue rosuvastatin 20 mg daily  Left carotid bruit: Carotid duplex 05/01/2019 showed 1 to 39% ICA stenosis bilaterally  RTC in 6 months   Medication Adjustments/Labs and Tests Ordered: Current medicines are reviewed at length with the patient today.  Concerns regarding medicines are outlined above.  No orders of the defined types were placed in this encounter.  No orders of the defined types were placed in this encounter.   Patient Instructions  Medication Instructions:  Your physician recommends that you continue on your current  medications as directed. Please refer to the Current Medication list given to you today.  *If you need a refill on your cardiac medications before your next appointment, please call your pharmacy*   Follow-Up: At [facility], you and your health needs are our priority.  As part  of our continuing mission to provide you with exceptional heart care, we have created designated Provider Care Teams.  These Care Teams include your primary Cardiologist (physician) and Advanced Practice Providers (APPs -  Physician Assistants and Nurse Practitioners) who all work together to provide you with the care you need, when you need it.  We recommend signing up for the patient portal called "MyChart".  Sign up information is provided on this After Visit Summary.  MyChart is used to connect with patients for Virtual Visits (Telemedicine).  Patients are able to view lab/test results, encounter notes, upcoming appointments, etc.  Non-urgent messages can be sent to your provider as well.   To learn more about what you can do with MyChart, go to [email or web address].    Your next appointment:   6 month(s)  The format for your next appointment:   In Person  Provider:   Oswaldo Milian, MD        Signed, Donato Heinz, MD  11/01/2019 3:44 PM    [place]

## 2019-10-29 ENCOUNTER — Encounter: Payer: Self-pay | Admitting: Cardiology

## 2019-10-29 ENCOUNTER — Ambulatory Visit: Payer: Medicare HMO | Admitting: Cardiology

## 2019-10-29 DIAGNOSIS — I2510 Atherosclerotic heart disease of native coronary artery without angina pectoris: Secondary | ICD-10-CM | POA: Diagnosis not present

## 2019-10-29 DIAGNOSIS — E785 Hyperlipidemia, unspecified: Secondary | ICD-10-CM | POA: Diagnosis not present

## 2019-10-29 DIAGNOSIS — M810 Age-related osteoporosis without current pathological fracture: Secondary | ICD-10-CM | POA: Diagnosis not present

## 2019-10-29 DIAGNOSIS — Z23 Encounter for immunization: Secondary | ICD-10-CM | POA: Diagnosis not present

## 2019-10-29 DIAGNOSIS — I739 Peripheral vascular disease, unspecified: Secondary | ICD-10-CM | POA: Diagnosis not present

## 2019-10-29 DIAGNOSIS — G4733 Obstructive sleep apnea (adult) (pediatric): Secondary | ICD-10-CM | POA: Diagnosis not present

## 2019-10-29 DIAGNOSIS — G4700 Insomnia, unspecified: Secondary | ICD-10-CM | POA: Diagnosis not present

## 2019-10-29 DIAGNOSIS — R030 Elevated blood-pressure reading, without diagnosis of hypertension: Secondary | ICD-10-CM | POA: Diagnosis not present

## 2019-10-30 ENCOUNTER — Other Ambulatory Visit: Payer: Self-pay | Admitting: Cardiology

## 2019-11-01 ENCOUNTER — Ambulatory Visit: Payer: Medicare HMO | Admitting: Cardiology

## 2019-11-01 ENCOUNTER — Encounter: Payer: Self-pay | Admitting: Cardiology

## 2019-11-01 ENCOUNTER — Other Ambulatory Visit: Payer: Self-pay

## 2019-11-01 VITALS — BP 114/64 | HR 62 | Ht 59.0 in | Wt 121.6 lb

## 2019-11-01 DIAGNOSIS — I519 Heart disease, unspecified: Secondary | ICD-10-CM | POA: Diagnosis not present

## 2019-11-01 DIAGNOSIS — I340 Nonrheumatic mitral (valve) insufficiency: Secondary | ICD-10-CM | POA: Diagnosis not present

## 2019-11-01 DIAGNOSIS — E785 Hyperlipidemia, unspecified: Secondary | ICD-10-CM | POA: Diagnosis not present

## 2019-11-01 DIAGNOSIS — I2510 Atherosclerotic heart disease of native coronary artery without angina pectoris: Secondary | ICD-10-CM | POA: Diagnosis not present

## 2019-11-01 DIAGNOSIS — R0989 Other specified symptoms and signs involving the circulatory and respiratory systems: Secondary | ICD-10-CM

## 2019-11-01 DIAGNOSIS — I10 Essential (primary) hypertension: Secondary | ICD-10-CM

## 2019-11-01 DIAGNOSIS — R69 Illness, unspecified: Secondary | ICD-10-CM | POA: Diagnosis not present

## 2019-11-01 NOTE — Patient Instructions (Signed)

## 2019-11-08 DIAGNOSIS — G4733 Obstructive sleep apnea (adult) (pediatric): Secondary | ICD-10-CM | POA: Diagnosis not present

## 2019-11-12 DIAGNOSIS — R69 Illness, unspecified: Secondary | ICD-10-CM | POA: Diagnosis not present

## 2019-11-28 ENCOUNTER — Other Ambulatory Visit: Payer: Self-pay | Admitting: Cardiology

## 2019-11-28 NOTE — Telephone Encounter (Signed)
This is Dr. Schumann's pt 

## 2019-12-01 ENCOUNTER — Other Ambulatory Visit: Payer: Self-pay | Admitting: Cardiology

## 2019-12-09 DIAGNOSIS — G4733 Obstructive sleep apnea (adult) (pediatric): Secondary | ICD-10-CM | POA: Diagnosis not present

## 2019-12-31 ENCOUNTER — Encounter (HOSPITAL_COMMUNITY): Payer: Medicare HMO

## 2019-12-31 ENCOUNTER — Ambulatory Visit: Payer: Medicare HMO | Admitting: Surgery

## 2020-01-17 DIAGNOSIS — C44519 Basal cell carcinoma of skin of other part of trunk: Secondary | ICD-10-CM | POA: Diagnosis not present

## 2020-01-17 DIAGNOSIS — Z85828 Personal history of other malignant neoplasm of skin: Secondary | ICD-10-CM | POA: Diagnosis not present

## 2020-01-17 DIAGNOSIS — D485 Neoplasm of uncertain behavior of skin: Secondary | ICD-10-CM | POA: Diagnosis not present

## 2020-02-07 DIAGNOSIS — G4733 Obstructive sleep apnea (adult) (pediatric): Secondary | ICD-10-CM | POA: Diagnosis not present

## 2020-02-11 ENCOUNTER — Ambulatory Visit: Payer: Medicare HMO | Admitting: Surgery

## 2020-02-11 ENCOUNTER — Encounter: Payer: Self-pay | Admitting: Surgery

## 2020-02-11 ENCOUNTER — Ambulatory Visit (INDEPENDENT_AMBULATORY_CARE_PROVIDER_SITE_OTHER)
Admission: RE | Admit: 2020-02-11 | Discharge: 2020-02-11 | Disposition: A | Discharge status: Home / Self Care | Payer: Medicare HMO | Source: Ambulatory Visit | Attending: Surgery | Admitting: Surgery

## 2020-02-11 ENCOUNTER — Ambulatory Visit (HOSPITAL_COMMUNITY)
Admission: RE | Admit: 2020-02-11 | Discharge: 2020-02-11 | Disposition: A | Discharge status: Home / Self Care | Payer: Medicare HMO | Source: Ambulatory Visit | Attending: Surgery | Admitting: Surgery

## 2020-02-11 ENCOUNTER — Other Ambulatory Visit: Payer: Self-pay

## 2020-02-11 VITALS — BP 135/84 | HR 54 | Temp 97.6°F | Resp 16 | Ht 59.0 in | Wt 119.0 lb

## 2020-02-11 DIAGNOSIS — I70213 Atherosclerosis of native arteries of extremities with intermittent claudication, bilateral legs: Secondary | ICD-10-CM

## 2020-02-11 DIAGNOSIS — I739 Peripheral vascular disease, unspecified: Secondary | ICD-10-CM

## 2020-02-11 NOTE — Progress Notes (Signed)
Vascular and Vein Specialist of [facility]  Patient name: Helen Washington MRN: [id number] DOB: 1945-08-10 Sex: female   REASON FOR VISIT:    Follow up  [place]:   Helen Washington is a 75 y.o. female who suffers from severe bilateral claudication.  On 09/13/2019 she underwent bilateral femoral endarterectomy with bovine pericardial patch angioplasty as well as a left external iliac stent.  About 1 month ago, she noticed some discomfort in her legs with walking again.  She does not have any open wounds. She denies any leg swelling  The patient suffers from white coat hypertension.she takes a statin for hypercholesterolemia. The patient is a former smoker   PAST MEDICAL HISTORY:   Past Medical History:  Diagnosis Date  . Arthritis   . Complication of anesthesia    woke up during colonoscopy  . Hyperlipidemia   . Hypertension   . Insomnia   . Sleep apnea      FAMILY HISTORY:   Family History  Problem Relation Age of Onset  . Stroke Mother   . Arthritis Mother   . CVA Mother   . Diverticulitis Mother   . Cancer Father   . Hypertension Sister   . Diverticulosis Sister   . Breast cancer Cousin   . Suicidality Brother   . Hypertension Brother   . Anxiety disorder Brother   . Kidney Stones Brother   . Ulcers Brother   . Healthy Daughter     SOCIAL HISTORY:   Social History   Tobacco Use  . Smoking status: Former Smoker    Packs/day: 1.00    Years: 36.00    Pack years: 36.00    Types: Cigarettes    Quit date: 04/01/2000    Years since quitting: 19.8  . Smokeless tobacco: Never Used  Substance Use Topics  . Alcohol use: Yes    Comment: 1 glass wine per week     ALLERGIES:   Allergies  Allergen Reactions  . Codeine Nausea Only     CURRENT MEDICATIONS:   Current Outpatient Medications  Medication Sig Dispense Refill  . alendronate (FOSAMAX) 70 MG tablet Take 70 mg by mouth once a week. Take with a  full glass of water on an empty stomach.    [name] aspirin EC 81 MG tablet Take 1 tablet (81 mg total) by mouth daily. 90 tablet 3  . carvedilol (COREG) 3.125 MG tablet Take 1 tablet (3.125 mg total) by mouth 2 (two) times daily. 180 tablet 3  . losartan (COZAAR) 25 MG tablet TAKE 1 TABLET BY MOUTH EVERY DAY 90 tablet 1  . rosuvastatin (CRESTOR) 20 MG tablet Take 1 tablet (20 mg total) by mouth daily. 90 tablet 3  . Vitamin D, Cholecalciferol, 25 MCG (1000 UT) TABS Take 1,000 Units by mouth daily.      No current facility-administered medications for this visit.    REVIEW OF SYSTEMS:   [X]  denotes positive finding, [ ]  denotes negative finding Cardiac  Comments:  Chest pain or chest pressure:    Shortness of breath upon exertion:    Short of breath when lying flat:    Irregular heart rhythm:        Vascular    Pain in calf, thigh, or hip brought on by ambulation:    Pain in feet at night that wakes you up from your sleep:     Blood clot in your veins:    Leg swelling:  Pulmonary    Oxygen at home:    Productive cough:     Wheezing:         Neurologic    Sudden weakness in arms or legs:     Sudden numbness in arms or legs:     Sudden onset of difficulty speaking or slurred speech:    Temporary loss of vision in one eye:     Problems with dizziness:         Gastrointestinal    Blood in stool:     Vomited blood:         Genitourinary    Burning when urinating:     Blood in urine:        Psychiatric    Major depression:         Hematologic    Bleeding problems:    Problems with blood clotting too easily:        Skin    Rashes or ulcers:        Constitutional    Fever or chills:      PHYSICAL EXAM:   There were no vitals filed for this visit.  GENERAL: The patient is a well-nourished female, in no acute distress. The vital signs are documented above. CARDIAC: There is a regular rate and rhythm.  VASCULAR: Nonpalpable pedal pulses PULMONARY: Non-labored  respirations MUSCULOSKELETAL: There are no major deformities or cyanosis. NEUROLOGIC: No focal weakness or paresthesias are detected. SKIN: There are no ulcers or rashes noted. PSYCHIATRIC: The patient has a normal affect.  STUDIES:   I have reviewed the following duplex results: +-------+-----------+-----------+------------+------------+  ABI/TBIToday's ABIToday's TBIPrevious ABIPrevious TBI  +-------+-----------+-----------+------------+------------+  Right 0.64    0.41    0.80    0.50      +-------+-----------+-----------+------------+------------+  Left  0.58    0.33    0.69    0.24      +-------+-----------+-----------+------------+------------+  Stenosis: +-------------------+-----------+  Location      Stent     +-------------------+-----------+  Left External Iliacno stenosis  +-------------------+-----------+  MEDICAL ISSUES:   PAD: ABIs are slightly decreased today.  The patient has known bilateral high-grade popliteal lesions.  We discussed proceeding with angiography or, just monitoring her symptoms.  She feels that she can tolerate her level of disability.  She will follow-up in 6 months with bilateral lower extremity duplex and ABIs.    Helen Alf, MD, FACS [facility] Specialists of [facility] [phone] Pager [phone]

## 2020-02-12 ENCOUNTER — Other Ambulatory Visit: Payer: Self-pay

## 2020-02-12 DIAGNOSIS — I70213 Atherosclerosis of native arteries of extremities with intermittent claudication, bilateral legs: Secondary | ICD-10-CM

## 2020-03-09 DIAGNOSIS — G4733 Obstructive sleep apnea (adult) (pediatric): Secondary | ICD-10-CM | POA: Diagnosis not present

## 2020-04-06 DIAGNOSIS — G4733 Obstructive sleep apnea (adult) (pediatric): Secondary | ICD-10-CM | POA: Diagnosis not present

## 2020-05-11 NOTE — Progress Notes (Deleted)
Cardiology Office Note:    Date:  05/11/2020   ID:  Helen Washington, DOB 1945-10-15, MRN [id number]  PCP:  Helen Solian, MD  Cardiologist:  No primary care provider on file.  Electrophysiologist:  None   Referring MD: Helen Solian, MD   No chief complaint on file.   History of Present Illness:    Helen Washington is a 75 y.o. female with a hx of hypertension, hyperlipidemia, PAD who presents for follow-up.  She was referred by Dr. Trula Slade for preop evaluation prior to bilateral femoral endarterectomy.  She was seen by Dr. Trula Slade for evaluation of claudication.  Angiogram on 05/15/2019 showed bilateral femoral artery occlusion, bilateral near occlusive popliteal stenosis.  Reports she has pain in her legs with minimal exertion, which limits her activity.  Up until 1 month ago she was doing water aerobics 3 times per week.  Would be in the pool for 3 hours.  She denies any exertional chest pain or dyspnea.  She quit smoking in 2002, smoked 1 pack/day x 45 years.  Denies any history of heart disease in her immediate family.  Coronary CTA on 06/30/2019 showed calcium score 1503 (97 percentile).  CT FFR showed severe stenosis in small nondominant circumflex artery (was read as occluded mid LAD and mid RCA on FFR but reviewed with Dr Meda Coffee and appears to not be occluded but rather not modeled on FFR due to small vessel size <1.73mm.  Lexiscan Myoview on 08/24/2019 showed large perfusion defect in basal to apical inferior/inferoseptal, minimal reversibility, EF 42%.  Echocardiogram on 08/29/2019 showed LVEF 45 to 05%, grade 1 diastolic dysfunction, RVSP 43 mmHg, moderate MR.  Since last clinic visit,    she reports that she has been doing well. She reports she is walking much better since her bilateral femoral endarterectomy. She is planning to start water aerobics again soon, will exercise for 2 hours 3 times per week once she starts. She denies any chest pain, dyspnea, lightheadedness, syncope,  palpitations, lower extremity edema. Reports BP is usually 110s to 120s at home, but does state she has had BP down to 80s.  BP Readings from Last 3 Encounters:  02/11/20 135/84  11/01/19 114/64  10/01/19 126/69      Past Medical History:  Diagnosis Date  . Arthritis   . Complication of anesthesia    woke up during colonoscopy  . Hyperlipidemia   . Hypertension   . Insomnia   . Sleep apnea     Past Surgical History:  Procedure Laterality Date  . ABDOMINAL AORTOGRAM W/LOWER EXTREMITY Bilateral 05/15/2019   Procedure: ABDOMINAL AORTOGRAM W/LOWER EXTREMITY;  Surgeon: Serafina Mitchell, MD;  Location: [facility];  Service: Cardiovascular;  Laterality: Bilateral;  . CATARACT EXTRACTION, BILATERAL  12/2015;01/2016   [facility]  . COLONOSCOPY    . ENDARTERECTOMY FEMORAL Bilateral 09/13/2019   Procedure: BILATERAL FEMORAL ENDARTERECTOMY with Patch Angioplasty;  Surgeon: Serafina Mitchell, MD;  Location: [facility];  Service: Vascular;  Laterality: Bilateral;  . INSERTION OF ILIAC STENT Left 09/13/2019   Procedure: INSERTION OF LEFT ILIAC STENT;  Surgeon: Serafina Mitchell, MD;  Location: [facility];  Service: Vascular;  Laterality: Left;  . PATCH ANGIOPLASTY Bilateral 09/13/2019   Procedure: PATCH ANGIOPLASTY Bilateral;  Surgeon: Serafina Mitchell, MD;  Location: [facility];  Service: Vascular;  Laterality: Bilateral;  . TUBAL LIGATION  1969    Current Medications: No outpatient medications have been marked as taking for the 05/14/20 encounter (Appointment) with  Donato Heinz, MD.     Allergies:   Codeine   Social History   Socioeconomic History  . Marital status: Divorced    Spouse name: Not on file  . Number of children: 1  . Years of education: 35  . Highest education level: Not on file  Occupational History    Comment: retired Scientist, clinical (histocompatibility and immunogenetics), typist  Tobacco Use  . Smoking status: Former Smoker    Packs/day: 1.00    Years: 36.00    Pack years: 36.00     Types: Cigarettes    Quit date: 04/01/2000    Years since quitting: 20.1  . Smokeless tobacco: Never Used  Vaping Use  . Vaping Use: Never used  Substance and Sexual Activity  . Alcohol use: Yes    Comment: 1 glass wine per week  . Drug use: No  . Sexual activity: Not on file  Other Topics Concern  . Not on file  Social History Narrative   Lives with room mate   Caffeine - coffee, 1 cup daily   Social Determinants of Health   Financial Resource Strain: Not on file  Food Insecurity: Not on file  Transportation Needs: Not on file  Physical Activity: Not on file  Stress: Not on file  Social Connections: Not on file     Family History: The patient's family history includes Anxiety disorder in her brother; Arthritis in her mother; Breast cancer in her cousin; CVA in her mother; Cancer in her father; Diverticulitis in her mother; Diverticulosis in her sister; Healthy in her daughter; Hypertension in her brother and sister; Kidney Stones in her brother; Stroke in her mother; Suicidality in her brother; Ulcers in her brother.  ROS:   Please see the history of present illness.     All other systems reviewed and are negative.  EKGs/Labs/Other Studies Reviewed:    The following studies were reviewed today:   EKG:  EKG is not ordered today.  The ekg ordered most recently demonstrates sinus rhythm, PVC, nonspecific T wave flattening, less than 1 mm ST depression in V6  Recent Labs: 09/10/2019: ALT 14 09/14/2019: BUN 12; Creatinine, Ser 0.97; Hemoglobin 8.9; Platelets 182; Potassium 4.4; Sodium 138  Recent Lipid Panel    Component Value Date/Time   CHOL 143 09/14/2019 0229   TRIG 63 09/14/2019 0229   HDL 72 09/14/2019 0229   CHOLHDL 2.0 09/14/2019 0229   VLDL 13 09/14/2019 0229   LDLCALC 58 09/14/2019 0229    Physical Exam:    VS:  There were no vitals taken for this visit.    Wt Readings from Last 3 Encounters:  02/11/20 119 lb (54 kg)  11/01/19 121 lb 9.6 oz (55.2 kg)   10/01/19 121 lb (54.9 kg)     GEN:  in no acute distress HEENT: Normal NECK: No JVD; left carotid bruit CARDIAC: RRR, no murmurs, rubs, gallops RESPIRATORY:  Clear to auscultation without rales, wheezing or rhonchi  ABDOMEN: Soft, non-tender, non-distended MUSCULOSKELETAL:  No edema; No deformity  SKIN: Warm and dry NEUROLOGIC:  Alert and oriented x 3 PSYCHIATRIC:  Normal affect   ASSESSMENT:    No diagnosis found. PLAN:    Coronary artery disease:Coronary CTA on 06/30/2019 showed calcium score 1503 (97 percentile).  CT FFR showed severe stenosis in small nondominant circumflex artery (was read as occluded mid LAD and mid RCA on FFR but reviewed with Dr Meda Coffee and appears to not be occluded but rather not modeled on FFR due to small vessel  size <1.33mm). Denies any anginal symptoms.  Lexiscan Myoview on 08/24/2019 showed large perfusion defect in basal to apical inferior/inferoseptal, minimal reversibility, EF 42%.  Echocardiogram on 08/29/2019 showed LVEF 45 to 88%, grade 1 diastolic dysfunction, RVSP 43 mmHg, moderate MR. -Continue aspirin 81 mg daily -Continue rosuvastatin 20 mg daily  Systolic dysfunction: EF 45 to 50% on echocardiogram on 08/29/2019 -Continue carvedilol 3.125 mg twice daily -Continue losartan 25 mg daily -Normotensive in clinic today, but reports soft BPs at home, will hold off on increasing medications at this time  Miltral regurgitation: Moderate on echocardiogram 08/29/2019.  Repeat echocardiogram in 1 year for monitoring  PAD: Angiogram on 05/15/2019 showed bilateral femoral artery occlusion, bilateral near occlusive popliteal stenosis.  Status post bilateral femoral endarterectomy on 09/13/2019. Continue aspirin and rosuvastatin.  Hypertension: On carvedilol 3.25 mg twice daily and losartan 25 mg daily  Hyperlipidemia: LDL 58 on 09/14/2019.  Continue rosuvastatin 20 mg daily  Left carotid bruit: Carotid duplex 05/01/2019 showed 1 to 39% ICA stenosis  bilaterally  RTC in ***   Medication Adjustments/Labs and Tests Ordered: Current medicines are reviewed at length with the patient today.  Concerns regarding medicines are outlined above.  No orders of the defined types were placed in this encounter.  No orders of the defined types were placed in this encounter.   There are no Patient Instructions on file for this visit.   Signed, Donato Heinz, MD  05/11/2020 3:49 PM    [place]

## 2020-05-14 ENCOUNTER — Other Ambulatory Visit: Payer: Self-pay

## 2020-05-14 ENCOUNTER — Encounter: Payer: Self-pay | Admitting: Cardiology

## 2020-05-14 ENCOUNTER — Ambulatory Visit: Payer: Medicare HMO | Admitting: Cardiology

## 2020-05-14 VITALS — BP 120/58 | HR 55 | Ht 60.0 in | Wt 121.6 lb

## 2020-05-14 DIAGNOSIS — I10 Essential (primary) hypertension: Secondary | ICD-10-CM

## 2020-05-14 DIAGNOSIS — I519 Heart disease, unspecified: Secondary | ICD-10-CM | POA: Diagnosis not present

## 2020-05-14 DIAGNOSIS — I2510 Atherosclerotic heart disease of native coronary artery without angina pectoris: Secondary | ICD-10-CM

## 2020-05-14 DIAGNOSIS — I340 Nonrheumatic mitral (valve) insufficiency: Secondary | ICD-10-CM

## 2020-05-14 DIAGNOSIS — I739 Peripheral vascular disease, unspecified: Secondary | ICD-10-CM

## 2020-05-14 DIAGNOSIS — E785 Hyperlipidemia, unspecified: Secondary | ICD-10-CM

## 2020-05-14 NOTE — Patient Instructions (Signed)
Medication Instructions:  Your physician recommends that you continue on your current medications as directed. Please refer to the Current Medication list given to you today.  *If you need a refill on your cardiac medications before your next appointment, please call your pharmacy*   Lab Work: None ordered  Testing/Procedures: To be scheduled before 6 month appointment. Your physician has requested that you have an echocardiogram. Echocardiography is a painless test that uses sound waves to create images of your heart. It provides your doctor with information about the size and shape of your heart and how well your heart's chambers and valves are working. This procedure takes approximately one hour. There are no restrictions for this procedure.   Follow-Up: At [facility], you and your health needs are our priority.  As part of our continuing mission to provide you with exceptional heart care, we have created designated Provider Care Teams.  These Care Teams include your primary Cardiologist (physician) and Advanced Practice Providers (APPs -  Physician Assistants and Nurse Practitioners) who all work together to provide you with the care you need, when you need it.  We recommend signing up for the patient portal called "MyChart".  Sign up information is provided on this After Visit Summary.  MyChart is used to connect with patients for Virtual Visits (Telemedicine).  Patients are able to view lab/test results, encounter notes, upcoming appointments, etc.  Non-urgent messages can be sent to your provider as well.   To learn more about what you can do with MyChart, go to [email or web address].    Your next appointment:   6 month(s)  The format for your next appointment:   In Person  Provider:   Oswaldo Milian, MD

## 2020-05-14 NOTE — Progress Notes (Signed)
Cardiology Office Note:    Date:  05/18/2020   ID:  [name], DOB 1945-05-11, MRN [id number]  PCP:  Prince Solian, MD  Cardiologist:  No primary care provider on file.  Electrophysiologist:  None   Referring MD: Prince Solian, MD   Chief Complaint  Patient presents with  . Follow-up  . Coronary Artery Disease    History of Present Illness:    Helen Washington is a 75 y.o. female with a hx of hypertension, hyperlipidemia, PAD who presents for follow-up.  She was referred by Dr. Trula Slade for preop evaluation prior to bilateral femoral endarterectomy.  She was seen by Dr. Trula Slade for evaluation of claudication.  Angiogram on 05/15/2019 showed bilateral femoral artery occlusion, bilateral near occlusive popliteal stenosis.  Reports she has pain in her legs with minimal exertion, which limits her activity.  Up until 1 month ago she was doing water aerobics 3 times per week.  Would be in the pool for 3 hours.  She denies any exertional chest pain or dyspnea.  She quit smoking in 2002, smoked 1 pack/day x 45 years.  Denies any history of heart disease in her immediate family.  Coronary CTA on 06/30/2019 showed calcium score 1503 (97 percentile).  CT FFR showed severe stenosis in small nondominant circumflex artery (was read as occluded mid LAD and mid RCA on FFR but reviewed with Dr Meda Coffee and appears to not be occluded but rather not modeled on FFR due to small vessel size <1.7mm.  Lexiscan Myoview on 08/24/2019 showed large perfusion defect in basal to apical inferior/inferoseptal, minimal reversibility, EF 42%.  Echocardiogram on 08/29/2019 showed LVEF 45 to 71%, grade 1 diastolic dysfunction, RVSP 43 mmHg, moderate MR.  Since last clinic visit, she reports doing fine overall. When walking a significant distance such as walking in the aisles of the grocery store, she feels bilateral leg pain mostly in her thighs and calves. She regularly participates in [facility] water aerobics for 2 hours at a time for  formal exercise. She does not have exertional symptoms at that time. Her blood pressure at home is well controlled around the 110s/50s.  She is compliant with her aspirin and statin medications. She denies any chest pain or shortness of breath, no lightheadedness or syncope. She has not noticed any palpitations or LE edema.      BP Readings from Last 3 Encounters:  05/14/20 (!) 120/58  02/11/20 135/84  11/01/19 114/64      Past Medical History:  Diagnosis Date  . Arthritis   . Complication of anesthesia    woke up during colonoscopy  . Hyperlipidemia   . Hypertension   . Insomnia   . Sleep apnea     Past Surgical History:  Procedure Laterality Date  . ABDOMINAL AORTOGRAM W/LOWER EXTREMITY Bilateral 05/15/2019   Procedure: ABDOMINAL AORTOGRAM W/LOWER EXTREMITY;  Surgeon: Serafina Mitchell, MD;  Location: [facility];  Service: Cardiovascular;  Laterality: Bilateral;  . CATARACT EXTRACTION, BILATERAL  12/2015;01/2016   [facility]  . COLONOSCOPY    . ENDARTERECTOMY FEMORAL Bilateral 09/13/2019   Procedure: BILATERAL FEMORAL ENDARTERECTOMY with Patch Angioplasty;  Surgeon: Serafina Mitchell, MD;  Location: [place];  Service: Vascular;  Laterality: Bilateral;  . INSERTION OF ILIAC STENT Left 09/13/2019   Procedure: INSERTION OF LEFT ILIAC STENT;  Surgeon: Serafina Mitchell, MD;  Location: [place];  Service: Vascular;  Laterality: Left;  . PATCH ANGIOPLASTY Bilateral 09/13/2019   Procedure: PATCH ANGIOPLASTY Bilateral;  Surgeon:  Serafina Mitchell, MD;  Location: [facility] OR;  Service: Vascular;  Laterality: Bilateral;  . TUBAL LIGATION  1969    Current Medications: Current Meds  Medication Sig  . alendronate (FOSAMAX) 70 MG tablet Take 70 mg by mouth once a week. Take with a full glass of water on an empty stomach.  [name] aspirin EC 81 MG tablet Take 1 tablet (81 mg total) by mouth daily.  . carvedilol (COREG) 3.125 MG tablet Take 1 tablet (3.125 mg total) by mouth 2  (two) times daily.  [name] losartan (COZAAR) 25 MG tablet TAKE 1 TABLET BY MOUTH EVERY DAY  . rosuvastatin (CRESTOR) 20 MG tablet Take 1 tablet (20 mg total) by mouth daily.  . Vitamin D, Cholecalciferol, 25 MCG (1000 UT) TABS Take 1,000 Units by mouth daily.      Allergies:   Codeine   Social History   Socioeconomic History  . Marital status: Divorced    Spouse name: Not on file  . Number of children: 1  . Years of education: 23  . Highest education level: Not on file  Occupational History    Comment: retired Scientist, clinical (histocompatibility and immunogenetics), typist  Tobacco Use  . Smoking status: Former Smoker    Packs/day: 1.00    Years: 36.00    Pack years: 36.00    Types: Cigarettes    Quit date: 04/01/2000    Years since quitting: 20.1  . Smokeless tobacco: Never Used  Vaping Use  . Vaping Use: Never used  Substance and Sexual Activity  . Alcohol use: Yes    Comment: 1 glass wine per week  . Drug use: No  . Sexual activity: Not on file  Other Topics Concern  . Not on file  Social History Narrative   Lives with room mate   Caffeine - coffee, 1 cup daily   Social Determinants of Health   Financial Resource Strain: Not on file  Food Insecurity: Not on file  Transportation Needs: Not on file  Physical Activity: Not on file  Stress: Not on file  Social Connections: Not on file     Family History: The patient's family history includes Anxiety disorder in her brother; Arthritis in her mother; Breast cancer in her cousin; CVA in her mother; Cancer in her father; Diverticulitis in her mother; Diverticulosis in her sister; Healthy in her daughter; Hypertension in her brother and sister; Kidney Stones in her brother; Stroke in her mother; Suicidality in her brother; Ulcers in her brother.  ROS:   Please see the history of present illness.    (+) Bilateral leg pain  All other systems reviewed and are negative.  EKGs/Labs/Other Studies Reviewed:    The following studies were reviewed today:   EKG:    06/06/2019: sinus rhythm, PVC, nonspecific T wave flattening, less than 1 mm ST depression in V6 07/13/2019: EKG was not ordered. 08/07/2019: EKG was not ordered. 05/14/2020: Sinus rhythm. Rate: 55 bpm. Q waves in V1/V2. Less than 1 mm ST depression in leads I, II, V5 and V6.   Recent Labs: 09/10/2019: ALT 14 09/14/2019: BUN 12; Creatinine, Ser 0.97; Hemoglobin 8.9; Platelets 182; Potassium 4.4; Sodium 138  Recent Lipid Panel    Component Value Date/Time   CHOL 143 09/14/2019 0229   TRIG 63 09/14/2019 0229   HDL 72 09/14/2019 0229   CHOLHDL 2.0 09/14/2019 0229   VLDL 13 09/14/2019 0229   LDLCALC 58 09/14/2019 0229    Physical Exam:    VS:  BP (!) 120/58  Pulse (!) 55   Ht 5' (1.524 m)   Wt 121 lb 9.6 oz (55.2 kg)   SpO2 96%   BMI 23.75 kg/m     Wt Readings from Last 3 Encounters:  05/14/20 121 lb 9.6 oz (55.2 kg)  02/11/20 119 lb (54 kg)  11/01/19 121 lb 9.6 oz (55.2 kg)     GEN:  in no acute distress HEENT: Normal NECK: No JVD; left carotid bruit CARDIAC: RRR, no murmurs, rubs, gallops RESPIRATORY:  Clear to auscultation without rales, wheezing or rhonchi  ABDOMEN: Soft, non-tender, non-distended MUSCULOSKELETAL:  No edema; No deformity  SKIN: Warm and dry NEUROLOGIC:  Alert and oriented x 3 PSYCHIATRIC:  Normal affect   ASSESSMENT:    1. CAD in native artery   2. Systolic dysfunction   3. Mitral valve insufficiency, unspecified etiology   4. Essential hypertension   5. PAD (peripheral artery disease) ([place])   6. Hyperlipidemia, unspecified hyperlipidemia type    PLAN:    Coronary artery disease:Coronary CTA on 06/30/2019 showed calcium score 1503 (97 percentile).  CT FFR showed severe stenosis in small nondominant circumflex artery (was read as occluded mid LAD and mid RCA on FFR but reviewed with Dr Meda Coffee and appears to not be occluded but rather not modeled on FFR due to small vessel size <1.70mm). Denies any anginal symptoms.  Lexiscan Myoview on 08/24/2019  showed large perfusion defect in basal to apical inferior/inferoseptal, minimal reversibility, EF 42%.  Echocardiogram on 08/29/2019 showed LVEF 45 to 67%, grade 1 diastolic dysfunction, RVSP 43 mmHg, moderate MR. -Continue aspirin 81 mg daily -Continue rosuvastatin 20 mg daily  Systolic dysfunction: EF 45 to 50% on echocardiogram on 08/29/2019 -Continue carvedilol 3.125 mg twice daily -Continue losartan 25 mg daily  Miltral regurgitation: Moderate on echocardiogram 08/29/2019.  Repeat echocardiogram in 1 year for monitoring  PAD: Angiogram on 05/15/2019 showed bilateral femoral artery occlusion, bilateral near occlusive popliteal stenosis.  Status post bilateral femoral endarterectomy on 09/13/2019. Continue aspirin and rosuvastatin.  Hypertension: On carvedilol 3.25 mg twice daily and losartan 25 mg daily  Hyperlipidemia: LDL 58 on 09/14/2019.  Continue rosuvastatin 20 mg daily  Left carotid bruit: Carotid duplex 05/01/2019 showed 1 to 39% ICA stenosis bilaterally  RTC in 6   Medication Adjustments/Labs and Tests Ordered: Current medicines are reviewed at length with the patient today.  Concerns regarding medicines are outlined above.  Orders Placed This Encounter  Procedures  . EKG 12-Lead  . ECHOCARDIOGRAM COMPLETE   No orders of the defined types were placed in this encounter.   Patient Instructions  Medication Instructions:  Your physician recommends that you continue on your current medications as directed. Please refer to the Current Medication list given to you today.  *If you need a refill on your cardiac medications before your next appointment, please call your pharmacy*   Lab Work: None ordered  Testing/Procedures: To be scheduled before 6 month appointment. Your physician has requested that you have an echocardiogram. Echocardiography is a painless test that uses sound waves to create images of your heart. It provides your doctor with information about the size and  shape of your heart and how well your heart's chambers and valves are working. This procedure takes approximately one hour. There are no restrictions for this procedure.   Follow-Up: At [facility], you and your health needs are our priority.  As part of our continuing mission to provide you with exceptional heart care, we have created designated Provider Care Teams.  These Care Teams include your primary Cardiologist (physician) and Advanced Practice Providers (APPs -  Physician Assistants and Nurse Practitioners) who all work together to provide you with the care you need, when you need it.  We recommend signing up for the patient portal called "MyChart".  Sign up information is provided on this After Visit Summary.  MyChart is used to connect with patients for Virtual Visits (Telemedicine).  Patients are able to view lab/test results, encounter notes, upcoming appointments, etc.  Non-urgent messages can be sent to your provider as well.   To learn more about what you can do with MyChart, go to [email or web address].    Your next appointment:   6 month(s)  The format for your next appointment:   In Person  Provider:   Oswaldo Milian, MD      [facility] Stumpf,acting as a scribe for Donato Heinz, MD.,have documented all relevant documentation on the behalf of Donato Heinz, MD,as directed by  Donato Heinz, MD while in the presence of Donato Heinz, MD.    Signed, Donato Heinz, MD  05/18/2020 11:31 PM    [place]

## 2020-05-19 ENCOUNTER — Other Ambulatory Visit: Payer: Self-pay | Admitting: Cardiology

## 2020-05-26 DIAGNOSIS — M810 Age-related osteoporosis without current pathological fracture: Secondary | ICD-10-CM | POA: Diagnosis not present

## 2020-05-26 DIAGNOSIS — E785 Hyperlipidemia, unspecified: Secondary | ICD-10-CM | POA: Diagnosis not present

## 2020-05-28 ENCOUNTER — Other Ambulatory Visit: Payer: Self-pay | Admitting: Internal Medicine

## 2020-05-28 DIAGNOSIS — I739 Peripheral vascular disease, unspecified: Secondary | ICD-10-CM | POA: Diagnosis not present

## 2020-05-28 DIAGNOSIS — G4700 Insomnia, unspecified: Secondary | ICD-10-CM | POA: Diagnosis not present

## 2020-05-28 DIAGNOSIS — Z1212 Encounter for screening for malignant neoplasm of rectum: Secondary | ICD-10-CM | POA: Diagnosis not present

## 2020-05-28 DIAGNOSIS — I5022 Chronic systolic (congestive) heart failure: Secondary | ICD-10-CM | POA: Diagnosis not present

## 2020-05-28 DIAGNOSIS — G4733 Obstructive sleep apnea (adult) (pediatric): Secondary | ICD-10-CM | POA: Diagnosis not present

## 2020-05-28 DIAGNOSIS — E785 Hyperlipidemia, unspecified: Secondary | ICD-10-CM | POA: Diagnosis not present

## 2020-05-28 DIAGNOSIS — I110 Hypertensive heart disease with heart failure: Secondary | ICD-10-CM | POA: Diagnosis not present

## 2020-05-28 DIAGNOSIS — R82998 Other abnormal findings in urine: Secondary | ICD-10-CM | POA: Diagnosis not present

## 2020-05-28 DIAGNOSIS — Z1231 Encounter for screening mammogram for malignant neoplasm of breast: Secondary | ICD-10-CM

## 2020-05-28 DIAGNOSIS — Z0000 Encounter for general adult medical examination without abnormal findings: Secondary | ICD-10-CM | POA: Diagnosis not present

## 2020-05-28 DIAGNOSIS — K5904 Chronic idiopathic constipation: Secondary | ICD-10-CM | POA: Diagnosis not present

## 2020-05-28 DIAGNOSIS — M810 Age-related osteoporosis without current pathological fracture: Secondary | ICD-10-CM | POA: Diagnosis not present

## 2020-05-28 DIAGNOSIS — I2510 Atherosclerotic heart disease of native coronary artery without angina pectoris: Secondary | ICD-10-CM | POA: Diagnosis not present

## 2020-06-02 DIAGNOSIS — G4733 Obstructive sleep apnea (adult) (pediatric): Secondary | ICD-10-CM | POA: Diagnosis not present

## 2020-06-29 ENCOUNTER — Other Ambulatory Visit: Payer: Self-pay | Admitting: Cardiology

## 2020-07-03 DIAGNOSIS — Z7983 Long term (current) use of bisphosphonates: Secondary | ICD-10-CM | POA: Diagnosis not present

## 2020-07-03 DIAGNOSIS — G8929 Other chronic pain: Secondary | ICD-10-CM | POA: Diagnosis not present

## 2020-07-03 DIAGNOSIS — E785 Hyperlipidemia, unspecified: Secondary | ICD-10-CM | POA: Diagnosis not present

## 2020-07-03 DIAGNOSIS — I2510 Atherosclerotic heart disease of native coronary artery without angina pectoris: Secondary | ICD-10-CM | POA: Diagnosis not present

## 2020-07-03 DIAGNOSIS — M810 Age-related osteoporosis without current pathological fracture: Secondary | ICD-10-CM | POA: Diagnosis not present

## 2020-07-03 DIAGNOSIS — G4733 Obstructive sleep apnea (adult) (pediatric): Secondary | ICD-10-CM | POA: Diagnosis not present

## 2020-07-03 DIAGNOSIS — Z7982 Long term (current) use of aspirin: Secondary | ICD-10-CM | POA: Diagnosis not present

## 2020-07-03 DIAGNOSIS — I70219 Atherosclerosis of native arteries of extremities with intermittent claudication, unspecified extremity: Secondary | ICD-10-CM | POA: Diagnosis not present

## 2020-07-03 DIAGNOSIS — I10 Essential (primary) hypertension: Secondary | ICD-10-CM | POA: Diagnosis not present

## 2020-07-03 DIAGNOSIS — M1990 Unspecified osteoarthritis, unspecified site: Secondary | ICD-10-CM | POA: Diagnosis not present

## 2020-07-03 DIAGNOSIS — R32 Unspecified urinary incontinence: Secondary | ICD-10-CM | POA: Diagnosis not present

## 2020-07-17 DIAGNOSIS — L570 Actinic keratosis: Secondary | ICD-10-CM | POA: Diagnosis not present

## 2020-07-17 DIAGNOSIS — Z85828 Personal history of other malignant neoplasm of skin: Secondary | ICD-10-CM | POA: Diagnosis not present

## 2020-07-17 DIAGNOSIS — L821 Other seborrheic keratosis: Secondary | ICD-10-CM | POA: Diagnosis not present

## 2020-07-24 ENCOUNTER — Ambulatory Visit: Payer: Medicare HMO

## 2020-08-02 DIAGNOSIS — G4733 Obstructive sleep apnea (adult) (pediatric): Secondary | ICD-10-CM | POA: Diagnosis not present

## 2020-08-18 ENCOUNTER — Ambulatory Visit (INDEPENDENT_AMBULATORY_CARE_PROVIDER_SITE_OTHER)
Admission: RE | Admit: 2020-08-18 | Discharge: 2020-08-18 | Disposition: A | Discharge status: Home / Self Care | Payer: Medicare HMO | Source: Ambulatory Visit | Attending: Surgery | Admitting: Surgery

## 2020-08-18 ENCOUNTER — Other Ambulatory Visit: Payer: Self-pay

## 2020-08-18 ENCOUNTER — Ambulatory Visit (HOSPITAL_COMMUNITY)
Admission: RE | Admit: 2020-08-18 | Discharge: 2020-08-18 | Disposition: A | Discharge status: Home / Self Care | Payer: Medicare HMO | Source: Ambulatory Visit | Attending: Surgery | Admitting: Surgery

## 2020-08-18 ENCOUNTER — Ambulatory Visit: Payer: Medicare HMO | Admitting: Surgery

## 2020-08-18 ENCOUNTER — Encounter: Payer: Self-pay | Admitting: Surgery

## 2020-08-18 VITALS — BP 146/66 | HR 52 | Temp 98.0°F | Resp 20 | Ht 60.0 in | Wt 119.0 lb

## 2020-08-18 DIAGNOSIS — I70213 Atherosclerosis of native arteries of extremities with intermittent claudication, bilateral legs: Secondary | ICD-10-CM

## 2020-08-18 NOTE — H&P (View-Only) (Signed)
Vascular and Vein Specialist of [facility]  Patient name: Helen Washington MRN: [id number] DOB: Jun 17, 1945 Sex: female   REASON FOR VISIT:    Follow up  [place]:    Helen Washington is a 75 y.o. female who suffers from severe bilateral claudication.  On 09/13/2019 she underwent bilateral femoral endarterectomy with bovine pericardial patch angioplasty as well as a left external iliac stent.   At her last visit, she was experiencing a mild recurrence of discomfort in her legs with walking.  She has known bilateral high-grade popliteal lesions.  She felt that her symptoms are tolerable and so no intervention was performed.  She is back today for follow-up.  She continues to have claudication symptoms.  The right leg is bothering more than the left   The patient suffers from white coat hypertension.  she takes a statin for hypercholesterolemia.  The patient is a former smoker   PAST MEDICAL HISTORY:   Past Medical History:  Diagnosis Date   Arthritis    Complication of anesthesia    woke up during colonoscopy   Hyperlipidemia    Hypertension    Insomnia    Sleep apnea      FAMILY HISTORY:   Family History  Problem Relation Age of Onset   Stroke Mother    Arthritis Mother    CVA Mother    Diverticulitis Mother    Cancer Father    Hypertension Sister    Diverticulosis Sister    Breast cancer Cousin    Suicidality Brother    Hypertension Brother    Anxiety disorder Brother    Kidney Stones Brother    Ulcers Brother    Healthy Daughter     SOCIAL HISTORY:   Social History   Tobacco Use   Smoking status: Former    Packs/day: 1.00    Years: 36.00    Pack years: 36.00    Types: Cigarettes    Quit date: 04/01/2000    Years since quitting: 20.3   Smokeless tobacco: Never  Substance Use Topics   Alcohol use: Yes    Comment: 1 glass wine per week     ALLERGIES:   Allergies  Allergen Reactions   Codeine Nausea  Only     CURRENT MEDICATIONS:   Current Outpatient Medications  Medication Sig Dispense Refill   alendronate (FOSAMAX) 70 MG tablet Take 70 mg by mouth once a week. Take with a full glass of water on an empty stomach.     aspirin EC 81 MG tablet Take 1 tablet (81 mg total) by mouth daily. 90 tablet 3   losartan (COZAAR) 25 MG tablet TAKE 1 TABLET BY MOUTH EVERY DAY 90 tablet 1   rosuvastatin (CRESTOR) 20 MG tablet TAKE 1 TABLET BY MOUTH EVERY DAY 90 tablet 3   Vitamin D, Cholecalciferol, 25 MCG (1000 UT) TABS Take 1,000 Units by mouth daily.      carvedilol (COREG) 3.125 MG tablet Take 1 tablet (3.125 mg total) by mouth 2 (two) times daily. 180 tablet 3   No current facility-administered medications for this visit.    REVIEW OF SYSTEMS:   '[X]'$  denotes positive finding, '[ ]'$  denotes negative finding Cardiac  Comments:  Chest pain or chest pressure:    Shortness of breath upon exertion:    Short of breath when lying flat:    Irregular heart rhythm:        Vascular    Pain in calf, thigh, or hip brought on  by ambulation: x   Pain in feet at night that wakes you up from your sleep:     Blood clot in your veins:    Leg swelling:         Pulmonary    Oxygen at home:    Productive cough:     Wheezing:         Neurologic    Sudden weakness in arms or legs:     Sudden numbness in arms or legs:     Sudden onset of difficulty speaking or slurred speech:    Temporary loss of vision in one eye:     Problems with dizziness:         Gastrointestinal    Blood in stool:     Vomited blood:         Genitourinary    Burning when urinating:     Blood in urine:        Psychiatric    Major depression:         Hematologic    Bleeding problems:    Problems with blood clotting too easily:        Skin    Rashes or ulcers:        Constitutional    Fever or chills:      PHYSICAL EXAM:   Vitals:   08/18/20 1106  BP: (!) 146/66  Pulse: (!) 52  Resp: 20  Temp: 98 F (36.7 C)   SpO2: 94%  Weight: 119 lb (54 kg)  Height: 5' (1.524 m)    GENERAL: The patient is a well-nourished female, in no acute distress. The vital signs are documented above. CARDIAC: There is a regular rate and rhythm.  VASCULAR: Nonpalpable pedal pulses PULMONARY: Non-labored respirations MUSCULOSKELETAL: There are no major deformities or cyanosis. NEUROLOGIC: No focal weakness or paresthesias are detected. SKIN: There are no ulcers or rashes noted. PSYCHIATRIC: The patient has a normal affect.  STUDIES:   I have reviewed the following: +-------+-----------+-----------+------------+------------+  ABI/TBIToday's ABIToday's TBIPrevious ABIPrevious TBI  +-------+-----------+-----------+------------+------------+  Right  0.63       0.38       0.64        0.41          +-------+-----------+-----------+------------+------------+  Left   0.6        0.35       0.58        0.33          +-------+-----------+-----------+------------+------------+  Right: 75-99% stenosis noted in the superficial femoral artery. 75-99%  stenosis noted in the popliteal artery.   Left: 50-74% stenosis noted in the deep femoral artery. 50-74% stenosis  noted in the popliteal artery.  MEDICAL ISSUES:   PAD: The patient's ABIs remained stable however there appears to be a high velocity elevation in the proximal right superficial femoral artery.  This is likely the distal edge of the previous patch angioplasty.  I think that this needs to be addressed before it compromises her endarterectomy.  Because of her claudication symptoms, I will also plan on intervening in her right popliteal artery.  This will be done through a left femoral approach on Tuesday, August 9.  I discussed the details of the procedure as well as the risks and benefits and she wants to proceed.      Leia Alf, MD, FACS [facility] of [facility] [phone] Pager [phone]

## 2020-08-18 NOTE — Progress Notes (Signed)
Vascular and Vein Specialist of [facility]  Patient name: Helen Washington MRN: [id number] DOB: 12/25/1945 Sex: female   REASON FOR VISIT:    Follow up  [place]:    Helen Washington is a 75 y.o. female who suffers from severe bilateral claudication.  On 09/13/2019 she underwent bilateral femoral endarterectomy with bovine pericardial patch angioplasty as well as a left external iliac stent.   At her last visit, she was experiencing a mild recurrence of discomfort in her legs with walking.  She has known bilateral high-grade popliteal lesions.  She felt that her symptoms are tolerable and so no intervention was performed.  She is back today for follow-up.  She continues to have claudication symptoms.  The right leg is bothering more than the left   The patient suffers from white coat hypertension.  she takes a statin for hypercholesterolemia.  The patient is a former smoker   PAST MEDICAL HISTORY:   Past Medical History:  Diagnosis Date   Arthritis    Complication of anesthesia    woke up during colonoscopy   Hyperlipidemia    Hypertension    Insomnia    Sleep apnea      FAMILY HISTORY:   Family History  Problem Relation Age of Onset   Stroke Mother    Arthritis Mother    CVA Mother    Diverticulitis Mother    Cancer Father    Hypertension Sister    Diverticulosis Sister    Breast cancer Cousin    Suicidality Brother    Hypertension Brother    Anxiety disorder Brother    Kidney Stones Brother    Ulcers Brother    Healthy Daughter     SOCIAL HISTORY:   Social History   Tobacco Use   Smoking status: Former    Packs/day: 1.00    Years: 36.00    Pack years: 36.00    Types: Cigarettes    Quit date: 04/01/2000    Years since quitting: 20.3   Smokeless tobacco: Never  Substance Use Topics   Alcohol use: Yes    Comment: 1 glass wine per week     ALLERGIES:   Allergies  Allergen Reactions   Codeine Nausea  Only     CURRENT MEDICATIONS:   Current Outpatient Medications  Medication Sig Dispense Refill   alendronate (FOSAMAX) 70 MG tablet Take 70 mg by mouth once a week. Take with a full glass of water on an empty stomach.     aspirin EC 81 MG tablet Take 1 tablet (81 mg total) by mouth daily. 90 tablet 3   losartan (COZAAR) 25 MG tablet TAKE 1 TABLET BY MOUTH EVERY DAY 90 tablet 1   rosuvastatin (CRESTOR) 20 MG tablet TAKE 1 TABLET BY MOUTH EVERY DAY 90 tablet 3   Vitamin D, Cholecalciferol, 25 MCG (1000 UT) TABS Take 1,000 Units by mouth daily.      carvedilol (COREG) 3.125 MG tablet Take 1 tablet (3.125 mg total) by mouth 2 (two) times daily. 180 tablet 3   No current facility-administered medications for this visit.    REVIEW OF SYSTEMS:   '[X]'$  denotes positive finding, '[ ]'$  denotes negative finding Cardiac  Comments:  Chest pain or chest pressure:    Shortness of breath upon exertion:    Short of breath when lying flat:    Irregular heart rhythm:        Vascular    Pain in calf, thigh, or hip brought on  by ambulation: x   Pain in feet at night that wakes you up from your sleep:     Blood clot in your veins:    Leg swelling:         Pulmonary    Oxygen at home:    Productive cough:     Wheezing:         Neurologic    Sudden weakness in arms or legs:     Sudden numbness in arms or legs:     Sudden onset of difficulty speaking or slurred speech:    Temporary loss of vision in one eye:     Problems with dizziness:         Gastrointestinal    Blood in stool:     Vomited blood:         Genitourinary    Burning when urinating:     Blood in urine:        Psychiatric    Major depression:         Hematologic    Bleeding problems:    Problems with blood clotting too easily:        Skin    Rashes or ulcers:        Constitutional    Fever or chills:      PHYSICAL EXAM:   Vitals:   08/18/20 1106  BP: (!) 146/66  Pulse: (!) 52  Resp: 20  Temp: 98 F (36.7 C)   SpO2: 94%  Weight: 119 lb (54 kg)  Height: 5' (1.524 m)    GENERAL: The patient is a well-nourished female, in no acute distress. The vital signs are documented above. CARDIAC: There is a regular rate and rhythm.  VASCULAR: Nonpalpable pedal pulses PULMONARY: Non-labored respirations MUSCULOSKELETAL: There are no major deformities or cyanosis. NEUROLOGIC: No focal weakness or paresthesias are detected. SKIN: There are no ulcers or rashes noted. PSYCHIATRIC: The patient has a normal affect.  STUDIES:   I have reviewed the following: +-------+-----------+-----------+------------+------------+  ABI/TBIToday's ABIToday's TBIPrevious ABIPrevious TBI  +-------+-----------+-----------+------------+------------+  Right  0.63       0.38       0.64        0.41          +-------+-----------+-----------+------------+------------+  Left   0.6        0.35       0.58        0.33          +-------+-----------+-----------+------------+------------+  Right: 75-99% stenosis noted in the superficial femoral artery. 75-99%  stenosis noted in the popliteal artery.   Left: 50-74% stenosis noted in the deep femoral artery. 50-74% stenosis  noted in the popliteal artery.  MEDICAL ISSUES:   PAD: The patient's ABIs remained stable however there appears to be a high velocity elevation in the proximal right superficial femoral artery.  This is likely the distal edge of the previous patch angioplasty.  I think that this needs to be addressed before it compromises her endarterectomy.  Because of her claudication symptoms, I will also plan on intervening in her right popliteal artery.  This will be done through a left femoral approach on Tuesday, August 9.  I discussed the details of the procedure as well as the risks and benefits and she wants to proceed.      Leia Alf, MD, FACS Vascular and Vein Specialists of [facility] [phone] Pager [phone]

## 2020-08-20 ENCOUNTER — Other Ambulatory Visit: Payer: Self-pay | Admitting: Cardiology

## 2020-08-26 ENCOUNTER — Other Ambulatory Visit: Payer: Self-pay

## 2020-08-26 ENCOUNTER — Encounter (HOSPITAL_COMMUNITY)
Admission: RE | Disposition: A | Discharge status: Home / Self Care | Payer: Self-pay | Source: Home / Self Care | Attending: Surgery

## 2020-08-26 ENCOUNTER — Encounter (HOSPITAL_COMMUNITY): Payer: Self-pay | Admitting: Surgery

## 2020-08-26 ENCOUNTER — Ambulatory Visit (HOSPITAL_COMMUNITY)
Admission: RE | Admit: 2020-08-26 | Discharge: 2020-08-26 | Disposition: A | Discharge status: Home / Self Care | Payer: Medicare HMO | Attending: Surgery | Admitting: Surgery

## 2020-08-26 ENCOUNTER — Ambulatory Visit: Payer: Medicare HMO

## 2020-08-26 DIAGNOSIS — Z79899 Other long term (current) drug therapy: Secondary | ICD-10-CM | POA: Diagnosis not present

## 2020-08-26 DIAGNOSIS — Z87891 Personal history of nicotine dependence: Secondary | ICD-10-CM | POA: Insufficient documentation

## 2020-08-26 DIAGNOSIS — I10 Essential (primary) hypertension: Secondary | ICD-10-CM | POA: Insufficient documentation

## 2020-08-26 DIAGNOSIS — I70213 Atherosclerosis of native arteries of extremities with intermittent claudication, bilateral legs: Secondary | ICD-10-CM | POA: Diagnosis not present

## 2020-08-26 DIAGNOSIS — Z7982 Long term (current) use of aspirin: Secondary | ICD-10-CM | POA: Diagnosis not present

## 2020-08-26 DIAGNOSIS — Z885 Allergy status to narcotic agent status: Secondary | ICD-10-CM | POA: Insufficient documentation

## 2020-08-26 DIAGNOSIS — E7800 Pure hypercholesterolemia, unspecified: Secondary | ICD-10-CM | POA: Insufficient documentation

## 2020-08-26 HISTORY — PX: PERIPHERAL VASCULAR INTERVENTION: CATH118257

## 2020-08-26 HISTORY — PX: ABDOMINAL AORTOGRAM W/LOWER EXTREMITY: CATH118223

## 2020-08-26 LAB — POCT I-STAT, CHEM 8
BUN: 20 mg/dL (ref 8–23)
Calcium, Ion: 1.34 mmol/L (ref 1.15–1.40)
Chloride: 105 mmol/L (ref 98–111)
Creatinine, Ser: 1.3 mg/dL — ABNORMAL HIGH (ref 0.44–1.00)
Glucose, Bld: 98 mg/dL (ref 70–99)
HCT: 32 % — ABNORMAL LOW (ref 36.0–46.0)
Hemoglobin: 10.9 g/dL — ABNORMAL LOW (ref 12.0–15.0)
Potassium: 4.1 mmol/L (ref 3.5–5.1)
Sodium: 141 mmol/L (ref 135–145)
TCO2: 24 mmol/L (ref 22–32)

## 2020-08-26 LAB — POCT ACTIVATED CLOTTING TIME
Activated Clotting Time: 208 seconds
Activated Clotting Time: 237 seconds
Activated Clotting Time: 179 seconds

## 2020-08-26 SURGERY — ABDOMINAL AORTOGRAM W/LOWER EXTREMITY
Anesthesia: LOCAL | Laterality: Right

## 2020-08-26 MED ORDER — SODIUM CHLORIDE 0.9% FLUSH: 3.0000 mL | INTRAVENOUS | Status: DC | PRN

## 2020-08-26 MED ORDER — CLOPIDOGREL BISULFATE 300 MG PO TABS
ORAL_TABLET | ORAL | Status: DC | PRN
  Administered 2020-08-26: 300 mg via ORAL

## 2020-08-26 MED ORDER — SODIUM CHLORIDE 0.9% FLUSH: 3.0000 mL | Freq: Two times a day (BID) | INTRAVENOUS | Status: DC

## 2020-08-26 MED ORDER — ACETAMINOPHEN 325 MG PO TABS
650.0000 mg | ORAL_TABLET | ORAL | Status: DC | PRN
Start: 2020-08-26 — End: 2020-08-26

## 2020-08-26 MED ORDER — CLOPIDOGREL BISULFATE 75 MG PO TABS: 75.0000 mg | ORAL_TABLET | Freq: Every day | ORAL | 11 refills | Status: DC

## 2020-08-26 MED ORDER — CLOPIDOGREL BISULFATE 75 MG PO TABS: 75.0000 mg | ORAL_TABLET | Freq: Every day | ORAL | Status: DC

## 2020-08-26 MED ORDER — MIDAZOLAM HCL 5 MG/5ML IJ SOLN
INTRAMUSCULAR | Status: AC
  Filled 2020-08-26: qty 5

## 2020-08-26 MED ORDER — CLOPIDOGREL BISULFATE 300 MG PO TABS
ORAL_TABLET | ORAL | Status: AC
  Filled 2020-08-26: qty 1

## 2020-08-26 MED ORDER — HEPARIN (PORCINE) IN NACL 1000-0.9 UT/500ML-% IV SOLN
INTRAVENOUS | Status: DC | PRN
  Administered 2020-08-26 (×2): 500 mL

## 2020-08-26 MED ORDER — HEPARIN SODIUM (PORCINE) 1000 UNIT/ML IJ SOLN
INTRAMUSCULAR | Status: DC | PRN
  Administered 2020-08-26: 2000 [IU] via INTRAVENOUS
  Administered 2020-08-26: 6000 [IU] via INTRAVENOUS

## 2020-08-26 MED ORDER — FENTANYL CITRATE (PF) 100 MCG/2ML IJ SOLN
INTRAMUSCULAR | Status: AC
  Filled 2020-08-26: qty 2

## 2020-08-26 MED ORDER — MIDAZOLAM HCL 2 MG/2ML IJ SOLN
INTRAMUSCULAR | Status: DC | PRN
  Administered 2020-08-26: 2 mg via INTRAVENOUS

## 2020-08-26 MED ORDER — FENTANYL CITRATE (PF) 100 MCG/2ML IJ SOLN
INTRAMUSCULAR | Status: DC | PRN
  Administered 2020-08-26: 50 ug via INTRAVENOUS

## 2020-08-26 MED ORDER — LIDOCAINE HCL (PF) 1 % IJ SOLN
INTRAMUSCULAR | Status: AC
  Filled 2020-08-26: qty 30

## 2020-08-26 MED ORDER — MORPHINE SULFATE (PF) 2 MG/ML IV SOLN: 2.0000 mg | INTRAVENOUS | Status: DC | PRN

## 2020-08-26 MED ORDER — IODIXANOL 320 MG/ML IV SOLN
INTRAVENOUS | Status: DC | PRN
  Administered 2020-08-26: 155 mL via INTRA_ARTERIAL

## 2020-08-26 MED ORDER — HYDRALAZINE HCL 20 MG/ML IJ SOLN: 5.0000 mg | INTRAMUSCULAR | Status: DC | PRN

## 2020-08-26 MED ORDER — ONDANSETRON HCL 4 MG/2ML IJ SOLN: 4.0000 mg | Freq: Four times a day (QID) | INTRAMUSCULAR | Status: DC | PRN

## 2020-08-26 MED ORDER — HEPARIN (PORCINE) IN NACL 1000-0.9 UT/500ML-% IV SOLN
INTRAVENOUS | Status: AC
  Filled 2020-08-26: qty 1000

## 2020-08-26 MED ORDER — LIDOCAINE HCL (PF) 1 % IJ SOLN
INTRAMUSCULAR | Status: DC | PRN
  Administered 2020-08-26: 18 mL via INTRADERMAL

## 2020-08-26 MED ORDER — LABETALOL HCL 5 MG/ML IV SOLN: 10.0000 mg | INTRAVENOUS | Status: DC | PRN

## 2020-08-26 MED ORDER — CLOPIDOGREL BISULFATE 75 MG PO TABS: 300.0000 mg | ORAL_TABLET | Freq: Once | ORAL | Status: DC

## 2020-08-26 MED ORDER — SODIUM CHLORIDE 0.9 % IV SOLN: 250.0000 mL | INTRAVENOUS | Status: DC | PRN

## 2020-08-26 MED ORDER — SODIUM CHLORIDE 0.9 % WEIGHT BASED INFUSION: 1.0000 mL/kg/h | INTRAVENOUS | Status: DC

## 2020-08-26 MED ORDER — HEPARIN SODIUM (PORCINE) 1000 UNIT/ML IJ SOLN
INTRAMUSCULAR | Status: AC
  Filled 2020-08-26: qty 1

## 2020-08-26 MED ORDER — SODIUM CHLORIDE 0.9 % IV SOLN: INTRAVENOUS | Status: DC

## 2020-08-26 SURGICAL SUPPLY — 21 items
BALLN MUSTANG 5X20X135 (BALLOONS) ×3
BALLOON MUSTANG 5X20X135 (BALLOONS) ×2 IMPLANT
CATH OMNI FLUSH 5F 65CM (CATHETERS) ×3 IMPLANT
DEVICE CONTINUOUS FLUSH (MISCELLANEOUS) ×3 IMPLANT
DEVICE TORQUE H2O (MISCELLANEOUS) ×3 IMPLANT
GLIDEWIRE ADV .035X260CM (WIRE) ×3 IMPLANT
GUIDEWIRE ANGLED .035X150CM (WIRE) ×3 IMPLANT
GUIDEWIRE ANGLED .035X260CM (WIRE) ×9 IMPLANT
KIT ENCORE 26 ADVANTAGE (KITS) ×3 IMPLANT
KIT MICROPUNCTURE NIT STIFF (SHEATH) ×3 IMPLANT
KIT PV (KITS) ×3 IMPLANT
SHEATH PINNACLE 5F 10CM (SHEATH) ×3 IMPLANT
SHEATH PINNACLE 6F 10CM (SHEATH) ×3 IMPLANT
SHEATH PINNACLE MP 6F 45CM (SHEATH) ×3 IMPLANT
SHEATH PROBE COVER 6X72 (BAG) ×3 IMPLANT
STENT ELUVIA 6X40X130 (Permanent Stent) ×6 IMPLANT
SYR MEDRAD MARK 7 150ML (SYRINGE) ×3 IMPLANT
TRANSDUCER W/STOPCOCK (MISCELLANEOUS) ×3 IMPLANT
TRAY PV CATH (CUSTOM PROCEDURE TRAY) ×3 IMPLANT
WIRE BENTSON .035X145CM (WIRE) ×3 IMPLANT
WIRE HI TORQ VERSACORE 300 (WIRE) ×3 IMPLANT

## 2020-08-26 NOTE — Interval H&P Note (Signed)
History and Physical Interval Note:  08/26/2020 7:26 AM  Helen Washington  has presented today for surgery, with the diagnosis of peripheral vascular disease.  The various methods of treatment have been discussed with the patient and family. After consideration of risks, benefits and other options for treatment, the patient has consented to  Procedure(s): ABDOMINAL AORTOGRAM W/LOWER EXTREMITY (N/A) as a surgical intervention.  The patient's history has been reviewed, patient examined, no change in status, stable for surgery.  I have reviewed the patient's chart and labs.  Questions were answered to the patient's satisfaction.     [name]

## 2020-08-26 NOTE — Op Note (Signed)
Patient name: Trecia Sofield MRN: [id number] DOB: Aug 09, 1945 Sex: female  08/26/2020 Pre-operative Diagnosis: Bilateral claudication Post-operative diagnosis:  Same Surgeon:  [name] Procedure Performed:  1.  Ultrasound-guided access, left femoral artery  2.  Abdominal aortogram  3.  Bilateral lower extremity runoff  4.  Stent, right superficial femoral and popliteal artery  5.  Conscious sedation, 83 minutes   Indications: The patient has previously undergone bilateral femoral endarterectomies.  She has known bilateral popliteal lesions.  Her most recent ultrasound suggested high-grade stenosis at the distal patch on the right.  She continues to have symptoms so angiography was indicated.  Procedure:  The patient was identified in the holding area and taken to room 8.  The patient was then placed supine on the table and prepped and draped in the usual sterile fashion.  A time out was called.  Conscious sedation was administered with the use of IV fentanyl and Versed under continuous physician and nurse monitoring.  Heart rate, blood pressure, and oxygen saturation were continuously monitored.  Total sedation time was 83 minutes.  Ultrasound was used to evaluate the left common femoral artery.  It was patent .  A digital ultrasound image was acquired.  A micropuncture needle was used to access the left common femoral artery under ultrasound guidance.  An 018 wire was advanced without resistance and a micropuncture sheath was placed.  The 018 wire was removed and a benson wire was placed.  The micropuncture sheath was exchanged for a 5 french sheath.  An omniflush catheter was advanced over the wire to the level of L-1.  An abdominal angiogram was obtained.  Next, using the omniflush catheter and a benson wire, the aortic bifurcation was crossed and the catheter was placed into theright external iliac artery and right runoff was obtained.  left runoff was performed via retrograde sheath  injections.  Findings:   Aortogram: Approximate 60% left renal artery stenosis.  The right renal artery is widely patent.  The infrarenal abdominal aorta is widely patent.  Bilateral common and external iliac arteries are widely patent as is the stent in the left external iliac artery.  Right Lower Extremity: The right common femoral and profundofemoral artery are widely patent.  There is a high-grade, 80% stenosis at the origin of the superficial femoral artery.  Superficial femoral artery remains widely patent distal to the stenosis.  There is a 90% stenosis within the popliteal artery just proximal to the joint crease.  There is three-vessel runoff with the dominant vessel being the posterior tibial  Left Lower Extremity: The left common femoral and profundofemoral artery are widely patent.  The superficial femoral artery is widely patent.  There is a short segment left popliteal occlusion behind the patella.  Intervention: After the above images were acquired the decision was made to proceed with intervention.  A 6 French 45 cm sheath was advanced into the right external iliac artery.  The patient was fully heparinized.  Next using a 035 Glidewire with the support of a 5 x 20 Mustang balloon, the lesions were successfully crossed.  I elected to predilate the popliteal lesions.  Both lesions were then treated with 6 x 40 Elluvia stents and postdilated with a 5 mm balloon.  Completion imaging showed resolution of the stenosis and no change in runoff.  Sheath was withdrawn to the left external iliac artery.  The patient was taken the holding area for sheath pull once her coagulation profile corrects.  Impression:  #1  Approximate 60% left renal artery stenosis  #2  89 to 90% proximal right superficial femoral artery stenosis successfully treated with a 6 x 40 Elluvia stent  #3  90% right popliteal artery stenosis behind the patella successfully treated with a 6 x 40 Elluvia  #4  Short segment left  popliteal artery occlusion approximately 2 cm in length.  The patient was brought back to the office for symptom evaluation repeat duplex.  If she continues to have trouble in the left leg, I would consider repeating her angiography with intervention of the left popliteal artery.   Theotis Burrow, M.D., [facility] [place] Office: [phone] Pager:  [phone]

## 2020-08-26 NOTE — Progress Notes (Addendum)
Site area: Left groin 6 French arterial was removed   Site Prior to Removal:  Level 0   Pressure Applied For 20 MINUTES     Bedrest Beginning at  1220 pm   Manual:   Yes.     Patient Status During Pull:  stable   Post Pull Groin Site:  Level 0   Post Pull Instructions Given:  Yes.     Post Pull Pulses Present:  Yes.     Dressing Applied:  Yes.     Comments:    Left DP and PT Doppler     Bedrest begins at 1220

## 2020-08-27 ENCOUNTER — Ambulatory Visit: Payer: Medicare HMO | Admitting: Adult Health

## 2020-09-01 DIAGNOSIS — G4733 Obstructive sleep apnea (adult) (pediatric): Secondary | ICD-10-CM | POA: Diagnosis not present

## 2020-09-24 ENCOUNTER — Other Ambulatory Visit: Payer: Self-pay

## 2020-09-24 DIAGNOSIS — I70213 Atherosclerosis of native arteries of extremities with intermittent claudication, bilateral legs: Secondary | ICD-10-CM

## 2020-09-29 ENCOUNTER — Ambulatory Visit (INDEPENDENT_AMBULATORY_CARE_PROVIDER_SITE_OTHER): Payer: Medicare HMO | Admitting: Physician Assistant

## 2020-09-29 ENCOUNTER — Ambulatory Visit (INDEPENDENT_AMBULATORY_CARE_PROVIDER_SITE_OTHER)
Admission: RE | Admit: 2020-09-29 | Discharge: 2020-09-29 | Disposition: A | Discharge status: Home / Self Care | Payer: Medicare HMO | Source: Ambulatory Visit | Attending: Surgery | Admitting: Surgery

## 2020-09-29 ENCOUNTER — Other Ambulatory Visit: Payer: Self-pay

## 2020-09-29 ENCOUNTER — Ambulatory Visit (HOSPITAL_COMMUNITY)
Admission: RE | Admit: 2020-09-29 | Discharge: 2020-09-29 | Disposition: A | Discharge status: Home / Self Care | Payer: Medicare HMO | Source: Ambulatory Visit | Attending: Surgery | Admitting: Surgery

## 2020-09-29 VITALS — BP 119/72 | HR 67 | Temp 97.4°F | Resp 14 | Ht 60.0 in | Wt 120.0 lb

## 2020-09-29 DIAGNOSIS — I70213 Atherosclerosis of native arteries of extremities with intermittent claudication, bilateral legs: Secondary | ICD-10-CM | POA: Insufficient documentation

## 2020-09-29 DIAGNOSIS — I739 Peripheral vascular disease, unspecified: Secondary | ICD-10-CM | POA: Diagnosis not present

## 2020-09-29 NOTE — Progress Notes (Signed)
Office Note     CC:  follow up Requesting Provider:  Prince Solian, MD  HPI: Helen Washington is a 75 y.o. ([birth year]) female who presents status post right SFA and popliteal artery stenting by Dr. Trula Slade on 08/26/2020 due to claudication.  She has previously undergone bilateral femoral endarterectomies by Dr. Trula Slade on 09/13/2019.  She states the right leg feels much better since SFA stenting.  She stays active doing water aerobics 3 days a week for 2 hours at a time.  She has claudication symptoms of left calf however these are tolerable.  On angiography she had a 2 cm left popliteal artery occlusion she is on aspirin, Plavix, statin daily.  She quit smoking 20 years ago.   Past Medical History:  Diagnosis Date   Arthritis    Complication of anesthesia    woke up during colonoscopy   Hyperlipidemia    Hypertension    Insomnia    Sleep apnea     Past Surgical History:  Procedure Laterality Date   ABDOMINAL AORTOGRAM W/LOWER EXTREMITY Bilateral 05/15/2019   Procedure: ABDOMINAL AORTOGRAM W/LOWER EXTREMITY;  Surgeon: Serafina Mitchell, MD;  Location: [place] [facility];  Service: Cardiovascular;  Laterality: Bilateral;   ABDOMINAL AORTOGRAM W/LOWER EXTREMITY N/A 08/26/2020   Procedure: ABDOMINAL AORTOGRAM W/LOWER EXTREMITY;  Surgeon: Serafina Mitchell, MD;  Location: [place] [facility];  Service: Cardiovascular;  Laterality: N/A;   CATARACT EXTRACTION, BILATERAL  12/2015;01/2016   [facility]   COLONOSCOPY     ENDARTERECTOMY FEMORAL Bilateral 09/13/2019   Procedure: BILATERAL FEMORAL ENDARTERECTOMY with Patch Angioplasty;  Surgeon: Serafina Mitchell, MD;  Location: [place];  Service: Vascular;  Laterality: Bilateral;   INSERTION OF ILIAC STENT Left 09/13/2019   Procedure: INSERTION OF LEFT ILIAC STENT;  Surgeon: Serafina Mitchell, MD;  [facility];  Service: Vascular;  Laterality: Left;   PATCH ANGIOPLASTY Bilateral 09/13/2019   Procedure: PATCH ANGIOPLASTY Bilateral;   Surgeon: Serafina Mitchell, MD;  [facility];  Service: Vascular;  Laterality: Bilateral;   PERIPHERAL VASCULAR INTERVENTION Right 08/26/2020   Procedure: PERIPHERAL VASCULAR INTERVENTION;  Surgeon: Serafina Mitchell, MD;  Location: [place] [facility];  Service: Cardiovascular;  Laterality: Right;  femoral popliteal artery   TUBAL LIGATION  1969    Social History   Socioeconomic History   Marital status: Divorced    Spouse name: Not on file   Number of children: 1   Years of education: 12   Highest education level: Not on file  Occupational History    Comment: retired Scientist, clinical (histocompatibility and immunogenetics), Sport and exercise psychologist  Tobacco Use   Smoking status: Former    Packs/day: 1.00    Years: 36.00    Pack years: 36.00    Types: Cigarettes    Quit date: 04/01/2000    Years since quitting: 20.5   Smokeless tobacco: Never  Vaping Use   Vaping Use: Never used  Substance and Sexual Activity   Alcohol use: Yes    Comment: 1 glass wine per week   Drug use: No   Sexual activity: Not on file  Other Topics Concern   Not on file  Social History Narrative   Lives with room mate   Caffeine - coffee, 1 cup daily   Social Determinants of Health   Financial Resource Strain: Not on file  Food Insecurity: Not on file  Transportation Needs: Not on file  Physical Activity: Not on file  Stress: Not on file  Social Connections: Not on  file  Intimate Partner Violence: Not on file    Family History  Problem Relation Age of Onset   Stroke Mother    Arthritis Mother    CVA Mother    Diverticulitis Mother    Cancer Father    Hypertension Sister    Diverticulosis Sister    Breast cancer Cousin    Suicidality Brother    Hypertension Brother    Anxiety disorder Brother    Kidney Stones Brother    Ulcers Brother    Healthy Daughter     Current Outpatient Medications  Medication Sig Dispense Refill   alendronate (FOSAMAX) 70 MG tablet Take 70 mg by mouth once a week. Take with a full glass of water on an empty stomach.      aspirin EC 81 MG tablet Take 1 tablet (81 mg total) by mouth daily. 90 tablet 3   carvedilol (COREG) 3.125 MG tablet TAKE 1 TABLET (3.125 MG TOTAL) BY MOUTH 2 (TWO) TIMES DAILY. 180 tablet 3   Cholecalciferol (VITAMIN D3) 50 MCG (2000 UT) CAPS Take 2,000 Units by mouth daily.     clopidogrel (PLAVIX) 75 MG tablet Take 1 tablet (75 mg total) by mouth daily. 30 tablet 11   losartan (COZAAR) 25 MG tablet TAKE 1 TABLET BY MOUTH EVERY DAY 90 tablet 1   rosuvastatin (CRESTOR) 20 MG tablet TAKE 1 TABLET BY MOUTH EVERY DAY 90 tablet 3   No current facility-administered medications for this visit.    Allergies  Allergen Reactions   Codeine Nausea Only     REVIEW OF SYSTEMS:   '[X]'$  denotes positive finding, '[ ]'$  denotes negative finding Cardiac  Comments:  Chest pain or chest pressure:    Shortness of breath upon exertion:    Short of breath when lying flat:    Irregular heart rhythm:        Vascular    Pain in calf, thigh, or hip brought on by ambulation:    Pain in feet at night that wakes you up from your sleep:     Blood clot in your veins:    Leg swelling:         Pulmonary    Oxygen at home:    Productive cough:     Wheezing:         Neurologic    Sudden weakness in arms or legs:     Sudden numbness in arms or legs:     Sudden onset of difficulty speaking or slurred speech:    Temporary loss of vision in one eye:     Problems with dizziness:         Gastrointestinal    Blood in stool:     Vomited blood:         Genitourinary    Burning when urinating:     Blood in urine:        Psychiatric    Major depression:         Hematologic    Bleeding problems:    Problems with blood clotting too easily:        Skin    Rashes or ulcers:        Constitutional    Fever or chills:      PHYSICAL EXAMINATION:  Vitals:   09/29/20 1352  BP: 119/72  Pulse: 67  Resp: 14  Temp: (!) 97.4 F (36.3 C)  TempSrc: Temporal  SpO2: 95%  Weight: 120 lb (54.4 kg)  Height: 5'  (1.524  m)    General:  WDWN in NAD; vital signs documented above Gait: Not observed HENT: WNL, normocephalic Pulmonary: normal non-labored breathing , without Rales, rhonchi,  wheezing Cardiac: regular HR Abdomen: soft, NT, no masses Skin: without rashes Vascular Exam/Pulses:  Right Left  Radial 2+ (normal) 2+ (normal)  DP absent absent  PT 2+ (normal) absent   Extremities: without ischemic changes, without Gangrene , without cellulitis; without open wounds;  Musculoskeletal: no muscle wasting or atrophy  Neurologic: A&O X 3;  No focal weakness or paresthesias are detected Psychiatric:  The pt has Normal affect.   Non-Invasive Vascular Imaging:   Patent stent with no evidence of stenosis  ABI/TBIToday's ABIToday's TBIPrevious ABIPrevious TBI  +-------+-----------+-----------+------------+------------+  Right  1.20       0.65       0.63        0.38          +-------+-----------+-----------+------------+------------+  Left   0.59       0.29       0.60        0.35            ASSESSMENT/PLAN:: 75 y.o. female here status post right SFA and popliteal artery stenting  -Right lower extremity claudication symptoms have completely resolved postoperatively -Arterial duplex demonstrates a widely patent SFA/popliteal stent and receive the appropriate increase in ABI -She has a known 2 cm left popliteal artery occlusion however her claudication symptoms are tolerable.  No indication for revascularization currently. -Continue aspirin, Plavix, statin daily -Recheck arterial duplex and ABI in 3 months.  If stable we will check again in 6 months.   Dagoberto Ligas, PA-C [facility] [phone]  Clinic MD:   Trula Slade

## 2020-10-01 ENCOUNTER — Other Ambulatory Visit: Payer: Self-pay

## 2020-10-01 DIAGNOSIS — I70213 Atherosclerosis of native arteries of extremities with intermittent claudication, bilateral legs: Secondary | ICD-10-CM

## 2020-10-02 DIAGNOSIS — G4733 Obstructive sleep apnea (adult) (pediatric): Secondary | ICD-10-CM | POA: Diagnosis not present

## 2020-10-08 ENCOUNTER — Ambulatory Visit
Admission: RE | Admit: 2020-10-08 | Discharge: 2020-10-08 | Disposition: A | Discharge status: Home / Self Care | Payer: Medicare HMO | Source: Ambulatory Visit | Attending: Internal Medicine | Admitting: Internal Medicine

## 2020-10-08 ENCOUNTER — Encounter: Payer: Self-pay | Admitting: Adult Health

## 2020-10-08 ENCOUNTER — Ambulatory Visit: Payer: Medicare HMO | Admitting: Adult Health

## 2020-10-08 ENCOUNTER — Other Ambulatory Visit: Payer: Self-pay

## 2020-10-08 VITALS — BP 138/71 | HR 61 | Ht 60.0 in | Wt 121.0 lb

## 2020-10-08 DIAGNOSIS — Z9989 Dependence on other enabling machines and devices: Secondary | ICD-10-CM

## 2020-10-08 DIAGNOSIS — Z1231 Encounter for screening mammogram for malignant neoplasm of breast: Secondary | ICD-10-CM

## 2020-10-08 DIAGNOSIS — G4733 Obstructive sleep apnea (adult) (pediatric): Secondary | ICD-10-CM | POA: Diagnosis not present

## 2020-10-08 NOTE — Progress Notes (Signed)
PATIENT: Helen Washington DOB: Nov 12, 1945  REASON FOR VISIT: follow up HISTORY FROM: patient PRIMARY NEUROLOGIST: Dr. Brett Fairy  HISTORY OF PRESENT ILLNESS: Today 10/08/20:  [name] is a 75 year old female with a history of obstructive sleep apnea on CPAP.  Her download is below.  She reports good compliance denies any issues.  She returns today for follow-up.    REVIEW OF SYSTEMS: Out of a complete 14 system review of symptoms, the patient complains only of the following symptoms, and all other reviewed systems are negative.  ESS 5  ALLERGIES: Allergies  Allergen Reactions   Codeine Nausea Only    HOME MEDICATIONS: Outpatient Medications Prior to Visit  Medication Sig Dispense Refill   alendronate (FOSAMAX) 70 MG tablet Take 70 mg by mouth once a week. Take with a full glass of water on an empty stomach.     aspirin EC 81 MG tablet Take 1 tablet (81 mg total) by mouth daily. 90 tablet 3   carvedilol (COREG) 3.125 MG tablet TAKE 1 TABLET (3.125 MG TOTAL) BY MOUTH 2 (TWO) TIMES DAILY. 180 tablet 3   Cholecalciferol (VITAMIN D3) 50 MCG (2000 UT) CAPS Take 2,000 Units by mouth daily.     clopidogrel (PLAVIX) 75 MG tablet Take 1 tablet (75 mg total) by mouth daily. 30 tablet 11   losartan (COZAAR) 25 MG tablet TAKE 1 TABLET BY MOUTH EVERY DAY 90 tablet 1   rosuvastatin (CRESTOR) 20 MG tablet TAKE 1 TABLET BY MOUTH EVERY DAY 90 tablet 3   No facility-administered medications prior to visit.    PAST MEDICAL HISTORY: Past Medical History:  Diagnosis Date   Arthritis    Complication of anesthesia    woke up during colonoscopy   Hyperlipidemia    Hypertension    Insomnia    Sleep apnea     PAST SURGICAL HISTORY: Past Surgical History:  Procedure Laterality Date   ABDOMINAL AORTOGRAM W/LOWER EXTREMITY Bilateral 05/15/2019   Procedure: ABDOMINAL AORTOGRAM W/LOWER EXTREMITY;  Surgeon: Serafina Mitchell, MD;  Location: [place] [facility];  Service: Cardiovascular;  Laterality:  Bilateral;   ABDOMINAL AORTOGRAM W/LOWER EXTREMITY N/A 08/26/2020   Procedure: ABDOMINAL AORTOGRAM W/LOWER EXTREMITY;  Surgeon: Serafina Mitchell, MD;  Location: [place] [facility];  Service: Cardiovascular;  Laterality: N/A;   CATARACT EXTRACTION, BILATERAL  12/2015;01/2016   [facility]   COLONOSCOPY     ENDARTERECTOMY FEMORAL Bilateral 09/13/2019   Procedure: BILATERAL FEMORAL ENDARTERECTOMY with Patch Angioplasty;  Surgeon: Serafina Mitchell, MD;  Location: [place];  Service: Vascular;  Laterality: Bilateral;   INSERTION OF ILIAC STENT Left 09/13/2019   Procedure: INSERTION OF LEFT ILIAC STENT;  Surgeon: Serafina Mitchell, MD;  [facility];  Service: Vascular;  Laterality: Left;   PATCH ANGIOPLASTY Bilateral 09/13/2019   Procedure: PATCH ANGIOPLASTY Bilateral;  Surgeon: Serafina Mitchell, MD;  [facility];  Service: Vascular;  Laterality: Bilateral;   PERIPHERAL VASCULAR INTERVENTION Right 08/26/2020   Procedure: PERIPHERAL VASCULAR INTERVENTION;  Surgeon: Serafina Mitchell, MD;  Location: [place] [facility];  Service: Cardiovascular;  Laterality: Right;  femoral popliteal artery   TUBAL LIGATION  1969    FAMILY HISTORY: Family History  Problem Relation Age of Onset   Stroke Mother    Arthritis Mother    CVA Mother    Diverticulitis Mother    Cancer Father    Hypertension Sister    Diverticulosis Sister    Breast cancer Cousin  Suicidality Brother    Hypertension Brother    Anxiety disorder Brother    Kidney Stones Brother    Ulcers Brother    Healthy Daughter     SOCIAL HISTORY: Social History   Socioeconomic History   Marital status: Divorced    Spouse name: Not on file   Number of children: 1   Years of education: 12   Highest education level: Not on file  Occupational History    Comment: retired Scientist, clinical (histocompatibility and immunogenetics), Sport and exercise psychologist  Tobacco Use   Smoking status: Former    Packs/day: 1.00    Years: 36.00    Pack years: 36.00    Types: Cigarettes    Quit date:  04/01/2000    Years since quitting: 20.5   Smokeless tobacco: Never  Vaping Use   Vaping Use: Never used  Substance and Sexual Activity   Alcohol use: Yes    Comment: 1 glass wine per week   Drug use: No   Sexual activity: Not on file  Other Topics Concern   Not on file  Social History Narrative   Lives alone   Caffeine - coffee, 1 cup daily; maybe 4 teas per week   Right handed   Social Determinants of Health   Financial Resource Strain: Not on file  Food Insecurity: Not on file  Transportation Needs: Not on file  Physical Activity: Not on file  Stress: Not on file  Social Connections: Not on file  Intimate Partner Violence: Not on file      PHYSICAL EXAM  Vitals:   10/08/20 1515  BP: 138/71  Pulse: 61  Weight: 121 lb (54.9 kg)  Height: 5' (1.524 m)   Body mass index is 23.63 kg/m.  Generalized: Well developed, in no acute distress  Chest: Lungs clear to auscultation bilaterally  Neurological examination  Mentation: Alert oriented to time, place, history taking. Follows all commands speech and language fluent Cranial nerve II-XII: Extraocular movements were full, visual field were full on confrontational test Head turning and shoulder shrug  were normal and symmetric. Motor: The motor testing reveals 5 over 5 strength of all 4 extremities. Good symmetric motor tone is noted throughout.  Sensory: Sensory testing is intact to soft touch on all 4 extremities. No evidence of extinction is noted.  Gait and station: Gait is normal.    DIAGNOSTIC DATA (LABS, IMAGING, TESTING) - I reviewed patient records, labs, notes, testing and imaging myself where available.  Lab Results  Component Value Date   WBC 9.9 09/14/2019   HGB 10.9 (L) 08/26/2020   HCT 32.0 (L) 08/26/2020   MCV 94.0 09/14/2019   PLT 182 09/14/2019      Component Value Date/Time   NA 141 08/26/2020 0616   NA 139 06/04/2019 1632   K 4.1 08/26/2020 0616   CL 105 08/26/2020 0616   CO2 24  09/14/2019 0229   GLUCOSE 98 08/26/2020 0616   BUN 20 08/26/2020 0616   BUN 16 06/04/2019 1632   CREATININE 1.30 (H) 08/26/2020 0616   CALCIUM 8.7 (L) 09/14/2019 0229   PROT 6.8 09/10/2019 0851   ALBUMIN 3.6 09/10/2019 0851   AST 18 09/10/2019 0851   ALT 14 09/10/2019 0851   ALKPHOS 62 09/10/2019 0851   BILITOT 0.4 09/10/2019 0851   GFRNONAA 58 (L) 09/14/2019 0229   GFRAA >60 09/14/2019 0229   Lab Results  Component Value Date   CHOL 143 09/14/2019   HDL 72 09/14/2019   LDLCALC 58 09/14/2019   TRIG 63  09/14/2019   CHOLHDL 2.0 09/14/2019   No results found for: HGBA1C No results found for: VITAMINB12 No results found for: TSH    ASSESSMENT AND PLAN 75 y.o. year old female  has a past medical history of Arthritis, Complication of anesthesia, Hyperlipidemia, Hypertension, Insomnia, and Sleep apnea. here with:  OSA on CPAP  - CPAP compliance excellent - Good treatment of AHI  - Encourage patient to use CPAP nightly and > 4 hours each night - F/U in 1 year or sooner if needed    Ward Givens, MSN, NP-C 10/08/2020, 3:34 PM [facility] [address], [place], [place] [address] [phone]

## 2020-10-28 ENCOUNTER — Other Ambulatory Visit (HOSPITAL_COMMUNITY): Payer: Medicare HMO

## 2020-11-01 DIAGNOSIS — G4733 Obstructive sleep apnea (adult) (pediatric): Secondary | ICD-10-CM | POA: Diagnosis not present

## 2020-11-03 ENCOUNTER — Ambulatory Visit: Payer: Medicare HMO | Admitting: Cardiology

## 2020-11-04 ENCOUNTER — Ambulatory Visit (HOSPITAL_COMMUNITY): Payer: Medicare HMO | Attending: Cardiovascular Disease

## 2020-11-04 ENCOUNTER — Other Ambulatory Visit: Payer: Self-pay

## 2020-11-04 DIAGNOSIS — I340 Nonrheumatic mitral (valve) insufficiency: Secondary | ICD-10-CM | POA: Insufficient documentation

## 2020-11-04 LAB — ECHOCARDIOGRAM COMPLETE
Area-P 1/2: 5.38 cm2
MV M vel: 5.66 m/s
MV Peak grad: 128.1 mmHg
S' Lateral: 3.9 cm

## 2020-11-13 NOTE — Progress Notes (Signed)
Cardiology Office Note:    Date:  11/14/2020   ID:  [name], DOB [birth year], MRN [id number]  PCP:  Prince Solian, MD  Cardiologist:  None  Electrophysiologist:  None   Referring MD: Prince Solian, MD   Chief Complaint  Patient presents with   Follow-up    6 months post echo.     History of Present Illness:    Helen Washington is a 75 y.o. female with a hx of hypertension, hyperlipidemia, PAD who presents for follow-up.  She was referred by Dr. Trula Slade for preop evaluation prior to bilateral femoral endarterectomy.  She was seen by Dr. Trula Slade for evaluation of claudication.  Angiogram on 05/15/2019 showed bilateral femoral artery occlusion, bilateral near occlusive popliteal stenosis.  Reports she has pain in her legs with minimal exertion, which limits her activity.  Up until 1 month ago she was doing water aerobics 3 times per week.  Would be in the pool for 3 hours.  She denies any exertional chest pain or dyspnea.  She quit smoking in 2002, smoked 1 pack/day x 45 years.  Denies any history of heart disease in her immediate family.  Coronary CTA on 06/30/2019 showed calcium score 1503 (97 percentile).  CT FFR showed severe stenosis in small nondominant circumflex artery (was read as occluded mid LAD and mid RCA on FFR but reviewed with Dr Meda Coffee and appears to not be occluded but rather not modeled on FFR due to small vessel size <1.22mm.  Lexiscan Myoview on 08/24/2019 showed large perfusion defect in basal to apical inferior/inferoseptal, minimal reversibility, EF 42%.  Echocardiogram on 08/29/2019 showed LVEF 45 to 93%, grade 1 diastolic dysfunction, RVSP 43 mmHg, moderate MR.  Echocardiogram 11/04/2020 showed EF 45 to 50%, normal RV function, moderate to severe MR, RVSP 41 mmHg.  Since last clinic visit, she reports that she is doing well.  Denies any chest pain, dyspnea, lightheadedness, syncope, lower extremity edema, or palpitations.  Does report pain in legs with walking has  improved significantly.  Does water aerobics 3 times per week for 2 hours, reports no exertional symptoms.    BP Readings from Last 3 Encounters:  11/14/20 138/66  10/08/20 138/71  09/29/20 119/72      Past Medical History:  Diagnosis Date   Arthritis    Complication of anesthesia    woke up during colonoscopy   Hyperlipidemia    Hypertension    Insomnia    Sleep apnea     Past Surgical History:  Procedure Laterality Date   ABDOMINAL AORTOGRAM W/LOWER EXTREMITY Bilateral 05/15/2019   Procedure: ABDOMINAL AORTOGRAM W/LOWER EXTREMITY;  Surgeon: Serafina Mitchell, MD;  Location: [facility];  Service: Cardiovascular;  Laterality: Bilateral;   ABDOMINAL AORTOGRAM W/LOWER EXTREMITY N/A 08/26/2020   Procedure: ABDOMINAL AORTOGRAM W/LOWER EXTREMITY;  Surgeon: Serafina Mitchell, MD;  Location: [facility];  Service: Cardiovascular;  Laterality: N/A;   CATARACT EXTRACTION, BILATERAL  12/2015;01/2016   [facility]   COLONOSCOPY     ENDARTERECTOMY FEMORAL Bilateral 09/13/2019   Procedure: BILATERAL FEMORAL ENDARTERECTOMY with Patch Angioplasty;  Surgeon: Serafina Mitchell, MD;  Location: [facility];  Service: Vascular;  Laterality: Bilateral;   INSERTION OF ILIAC STENT Left 09/13/2019   Procedure: INSERTION OF LEFT ILIAC STENT;  Surgeon: Serafina Mitchell, MD;  Location: [facility];  Service: Vascular;  Laterality: Left;   PATCH ANGIOPLASTY Bilateral 09/13/2019   Procedure: PATCH ANGIOPLASTY Bilateral;  Surgeon: Serafina Mitchell, MD;  Location: [facility]  OR;  Service: Vascular;  Laterality: Bilateral;   PERIPHERAL VASCULAR INTERVENTION Right 08/26/2020   Procedure: PERIPHERAL VASCULAR INTERVENTION;  Surgeon: Serafina Mitchell, MD;  Location: [facility];  Service: Cardiovascular;  Laterality: Right;  femoral popliteal artery   TUBAL LIGATION  1969    Current Medications: Current Meds  Medication Sig   alendronate (FOSAMAX) 70 MG tablet Take 70 mg by mouth once a week.  Take with a full glass of water on an empty stomach.   aspirin EC 81 MG tablet Take 1 tablet (81 mg total) by mouth daily.   carvedilol (COREG) 3.125 MG tablet TAKE 1 TABLET (3.125 MG TOTAL) BY MOUTH 2 (TWO) TIMES DAILY.   Cholecalciferol (VITAMIN D3) 50 MCG (2000 UT) CAPS Take 2,000 Units by mouth daily.   clopidogrel (PLAVIX) 75 MG tablet Take 1 tablet (75 mg total) by mouth daily.   losartan (COZAAR) 25 MG tablet TAKE 1 TABLET BY MOUTH EVERY DAY   rosuvastatin (CRESTOR) 20 MG tablet TAKE 1 TABLET BY MOUTH EVERY DAY     Allergies:   Codeine   Social History   Socioeconomic History   Marital status: Divorced    Spouse name: Not on file   Number of children: 1   Years of education: 12   Highest education level: Not on file  Occupational History    Comment: retired Scientist, clinical (histocompatibility and immunogenetics), Sport and exercise psychologist  Tobacco Use   Smoking status: Former    Packs/day: 1.00    Years: 36.00    Pack years: 36.00    Types: Cigarettes    Quit date: 04/01/2000    Years since quitting: 20.6   Smokeless tobacco: Never  Vaping Use   Vaping Use: Never used  Substance and Sexual Activity   Alcohol use: Yes    Comment: 1 glass wine per week   Drug use: No   Sexual activity: Not on file  Other Topics Concern   Not on file  Social History Narrative   Lives alone   Caffeine - coffee, 1 cup daily; maybe 4 teas per week   Right handed   Social Determinants of Health   Financial Resource Strain: Not on file  Food Insecurity: Not on file  Transportation Needs: Not on file  Physical Activity: Not on file  Stress: Not on file  Social Connections: Not on file     Family History: The patient's family history includes Anxiety disorder in her brother; Arthritis in her mother; Breast cancer in her cousin; CVA in her mother; Cancer in her father; Diverticulitis in her mother; Diverticulosis in her sister; Healthy in her daughter; Hypertension in her brother and sister; Kidney Stones in her brother; Stroke in her mother;  Suicidality in her brother; Ulcers in her brother.  ROS:   Please see the history of present illness.      All other systems reviewed and are negative.  EKGs/Labs/Other Studies Reviewed:    The following studies were reviewed today:   EKG:   06/06/2019: sinus rhythm, PVC, nonspecific T wave flattening, less than 1 mm ST depression in V6 07/13/2019: EKG was not ordered. 08/07/2019: EKG was not ordered. 05/14/2020: Sinus rhythm. Rate: 55 bpm. Q waves in V1/V2. Less than 1 mm ST depression in leads I, II, V5 and V6.  11/14/20: Sinus rhythm, rate 62, Q waves in V1/2, T wave inversions in leads III, aVF, less than 1 mm ST depression in leads V5/6  Recent Labs: 08/26/2020: BUN 20; Creatinine, Ser 1.30; Hemoglobin 10.9; Potassium  4.1; Sodium 141  Recent Lipid Panel    Component Value Date/Time   CHOL 143 09/14/2019 0229   TRIG 63 09/14/2019 0229   HDL 72 09/14/2019 0229   CHOLHDL 2.0 09/14/2019 0229   VLDL 13 09/14/2019 0229   LDLCALC 58 09/14/2019 0229    Physical Exam:    VS:  BP 138/66 (BP Location: Right Arm, Patient Position: Sitting, Cuff Size: Normal)   Pulse 62   Ht 5' (1.524 m)   Wt 123 lb (55.8 kg)   BMI 24.02 kg/m     Wt Readings from Last 3 Encounters:  11/14/20 123 lb (55.8 kg)  10/08/20 121 lb (54.9 kg)  09/29/20 120 lb (54.4 kg)     GEN:  in no acute distress HEENT: Normal NECK: No JVD; left carotid bruit CARDIAC: RRR, no murmurs, rubs, gallops RESPIRATORY:  Clear to auscultation without rales, wheezing or rhonchi  ABDOMEN: Soft, non-tender, non-distended MUSCULOSKELETAL:  No edema; No deformity  SKIN: Warm and dry NEUROLOGIC:  Alert and oriented x 3 PSYCHIATRIC:  Normal affect   ASSESSMENT:    1. CAD in native artery   2. Systolic dysfunction   3. Mitral valve insufficiency, unspecified etiology   4. Essential hypertension   5. Hyperlipidemia, unspecified hyperlipidemia type     PLAN:    Coronary artery disease:Coronary CTA on 06/30/2019 showed  calcium score 1503 (97 percentile).  CT FFR showed severe stenosis in small nondominant circumflex artery (was read as occluded mid LAD and mid RCA on FFR but reviewed with Dr Meda Coffee and appears to not be occluded but rather not modeled on FFR due to small vessel size <1.70mm). Denies any anginal symptoms.  Lexiscan Myoview on 08/24/2019 showed large perfusion defect in basal to apical inferior/inferoseptal, minimal reversibility, EF 42%.  Echocardiogram on 08/29/2019 showed LVEF 45 to 89%, grade 1 diastolic dysfunction, RVSP 43 mmHg, moderate MR. Echocardiogram 11/04/2020 showed EF 45 to 50%, normal RV function, moderate to severe MR, RVSP 41 mmHg. -Continue aspirin 81 mg daily -Continue rosuvastatin 20 mg daily, will check lipid panel  Systolic dysfunction: EF 45 to 50% on echocardiogram on 08/29/2019.  Stable at 45 to 50% on echo 11/04/2020 -Continue carvedilol 3.125 mg twice daily -Continue losartan 25 mg daily  Miltral regurgitation: Moderate on echocardiogram 08/29/2019.  Echocardiogram 11/04/2020 showed moderate to severe MR with eccentric posterior directed MR jet, could bedue to restricted posterior leaflet, although some images suggested hockey-stick deformity of the anterior leaflet which could indicate possible rheumatic disease.   -No murmur on exam, less likely severe MR. Recommend cardiac MRI to further evaluate systolic dysfunction and MR  PAD: Angiogram on 05/15/2019 showed bilateral femoral artery occlusion, bilateral near occlusive popliteal stenosis.  Status post bilateral femoral endarterectomy on 09/13/2019.  Underwent stent to right superficial femoral and popliteal artery on 08/26/2020.  Continue aspirin and rosuvastatin.  Hypertension: On carvedilol 3.25 mg twice daily and losartan 25 mg daily.  Appears controlled.  Hyperlipidemia: LDL 58 on 09/14/2019.  Continue rosuvastatin 20 mg daily, will check lipid panel  Left carotid bruit: Carotid duplex 05/01/2019 showed 1 to 39% ICA stenosis  bilaterally  RTC in 2 months   Medication Adjustments/Labs and Tests Ordered: Current medicines are reviewed at length with the patient today.  Concerns regarding medicines are outlined above.  Orders Placed This Encounter  Procedures   MR CARDIAC MORPHOLOGY W WO CONTRAST   Basic metabolic panel   CBC   Lipid panel   EKG 12-Lead    No  orders of the defined types were placed in this encounter.   Patient Instructions  Medication Instructions:  Your physician recommends that you continue on your current medications as directed. Please refer to the Current Medication list given to you today.  *If you need a refill on your cardiac medications before your next appointment, please call your pharmacy*   Lab Work: BMET, CBC, Lipid today  If you have labs (blood work) drawn today and your tests are completely normal, you will receive your results only by: [place] (if you have MyChart) OR A paper copy in the mail If you have any lab test that is abnormal or we need to change your treatment, we will call you to review the results.   Testing/Procedures: Your physician has requested that you have a cardiac MRI. Cardiac MRI uses a computer to create images of your heart as its beating, producing both still and moving pictures of your heart and major blood vessels. For further information please visit [email or web address] Please follow the instruction sheet given to you today for more information.    Follow-Up: At [facility], you and your health needs are our priority.  As part of our continuing mission to provide you with exceptional heart care, we have created designated Provider Care Teams.  These Care Teams include your primary Cardiologist (physician) and Advanced Practice Providers (APPs -  Physician Assistants and Nurse Practitioners) who all work together to provide you with the care you need, when you need it.  We recommend signing up for the patient portal called  "MyChart".  Sign up information is provided on this After Visit Summary.  MyChart is used to connect with patients for Virtual Visits (Telemedicine).  Patients are able to view lab/test results, encounter notes, upcoming appointments, etc.  Non-urgent messages can be sent to your provider as well.   To learn more about what you can do with MyChart, go to [email or web address].    Your next appointment:   2 month(s)  The format for your next appointment:   In Person  Provider:   Oswaldo Milian, MD       Signed, Donato Heinz, MD  11/14/2020 9:41 AM    [place]

## 2020-11-14 ENCOUNTER — Ambulatory Visit: Payer: Medicare HMO | Admitting: Cardiology

## 2020-11-14 ENCOUNTER — Encounter: Payer: Self-pay | Admitting: Cardiology

## 2020-11-14 ENCOUNTER — Other Ambulatory Visit: Payer: Self-pay

## 2020-11-14 VITALS — BP 138/66 | HR 62 | Ht 60.0 in | Wt 123.0 lb

## 2020-11-14 DIAGNOSIS — E785 Hyperlipidemia, unspecified: Secondary | ICD-10-CM

## 2020-11-14 DIAGNOSIS — I340 Nonrheumatic mitral (valve) insufficiency: Secondary | ICD-10-CM

## 2020-11-14 DIAGNOSIS — I10 Essential (primary) hypertension: Secondary | ICD-10-CM | POA: Diagnosis not present

## 2020-11-14 DIAGNOSIS — I519 Heart disease, unspecified: Secondary | ICD-10-CM

## 2020-11-14 DIAGNOSIS — I2510 Atherosclerotic heart disease of native coronary artery without angina pectoris: Secondary | ICD-10-CM

## 2020-11-14 NOTE — Patient Instructions (Signed)
Medication Instructions:  Your physician recommends that you continue on your current medications as directed. Please refer to the Current Medication list given to you today.  *If you need a refill on your cardiac medications before your next appointment, please call your pharmacy*   Lab Work: BMET, CBC, Lipid today  If you have labs (blood work) drawn today and your tests are completely normal, you will receive your results only by: [place] (if you have MyChart) OR A paper copy in the mail If you have any lab test that is abnormal or we need to change your treatment, we will call you to review the results.   Testing/Procedures: Your physician has requested that you have a cardiac MRI. Cardiac MRI uses a computer to create images of your heart as its beating, producing both still and moving pictures of your heart and major blood vessels. For further information please visit [email or web address] Please follow the instruction sheet given to you today for more information.    Follow-Up: At [facility], you and your health needs are our priority.  As part of our continuing mission to provide you with exceptional heart care, we have created designated Provider Care Teams.  These Care Teams include your primary Cardiologist (physician) and Advanced Practice Providers (APPs -  Physician Assistants and Nurse Practitioners) who all work together to provide you with the care you need, when you need it.  We recommend signing up for the patient portal called "MyChart".  Sign up information is provided on this After Visit Summary.  MyChart is used to connect with patients for Virtual Visits (Telemedicine).  Patients are able to view lab/test results, encounter notes, upcoming appointments, etc.  Non-urgent messages can be sent to your provider as well.   To learn more about what you can do with MyChart, go to [email or web address].    Your next appointment:   2 month(s)  The format for  your next appointment:   In Person  Provider:   Oswaldo Milian, MD

## 2020-11-15 LAB — BASIC METABOLIC PANEL
BUN/Creatinine Ratio: 14 (ref 12–28)
BUN: 14 mg/dL (ref 8–27)
CO2: 23 mmol/L (ref 20–29)
Calcium: 9.6 mg/dL (ref 8.7–10.3)
Chloride: 107 mmol/L — ABNORMAL HIGH (ref 96–106)
Creatinine, Ser: 1 mg/dL (ref 0.57–1.00)
Glucose: 90 mg/dL (ref 70–99)
Potassium: 5.2 mmol/L (ref 3.5–5.2)
Sodium: 143 mmol/L (ref 134–144)
eGFR: 59 mL/min/{1.73_m2} — ABNORMAL LOW (ref >59)

## 2020-11-15 LAB — CBC
Hematocrit: 35.4 % (ref 34.0–46.6)
Hemoglobin: 12.1 g/dL (ref 11.1–15.9)
MCH: 31.6 pg (ref 26.6–33.0)
MCHC: 34.2 g/dL (ref 31.5–35.7)
MCV: 92 fL (ref 79–97)
Platelets: 206 10*3/uL (ref 150–450)
RBC: 3.83 x10E6/uL (ref 3.77–5.28)
RDW: 12.7 % (ref 11.7–15.4)
WBC: 6.3 10*3/uL (ref 3.4–10.8)

## 2020-11-15 LAB — LIPID PANEL
Chol/HDL Ratio: 2.1 ratio (ref 0.0–4.4)
Cholesterol, Total: 172 mg/dL (ref 100–199)
HDL: 82 mg/dL (ref >39)
LDL Chol Calc (NIH): 78 mg/dL (ref 0–99)
Triglycerides: 60 mg/dL (ref 0–149)
VLDL Cholesterol Cal: 12 mg/dL (ref 5–40)

## 2020-11-16 ENCOUNTER — Other Ambulatory Visit: Payer: Self-pay | Admitting: Cardiology

## 2020-11-17 ENCOUNTER — Other Ambulatory Visit: Payer: Self-pay | Admitting: *Deleted

## 2020-11-17 MED ORDER — ROSUVASTATIN CALCIUM 40 MG PO TABS: 40.0000 mg | ORAL_TABLET | Freq: Every day | ORAL | 3 refills | Status: DC

## 2020-12-12 ENCOUNTER — Telehealth (HOSPITAL_COMMUNITY): Payer: Self-pay | Admitting: Emergency Medicine

## 2020-12-12 NOTE — Telephone Encounter (Signed)
Reaching out to patient to offer assistance regarding upcoming cardiac imaging study; pt verbalizes understanding of appt date/time, parking situation and where to check in, and verified current allergies; name and call back number provided for further questions should they arise Marchia Bond RN Navigator Cardiac Imaging [facility] [phone] office [phone] cell  Denies claustro Denies implants other than stents Denies iv issues

## 2020-12-15 ENCOUNTER — Ambulatory Visit (HOSPITAL_COMMUNITY): Admission: RE | Admit: 2020-12-15 | Payer: Medicare HMO | Source: Ambulatory Visit

## 2020-12-16 ENCOUNTER — Other Ambulatory Visit: Payer: Self-pay

## 2020-12-16 ENCOUNTER — Ambulatory Visit (HOSPITAL_COMMUNITY)
Admission: RE | Admit: 2020-12-16 | Discharge: 2020-12-16 | Disposition: A | Discharge status: Home / Self Care | Payer: Medicare HMO | Source: Ambulatory Visit | Attending: Cardiology | Admitting: Cardiology

## 2020-12-16 DIAGNOSIS — I519 Heart disease, unspecified: Secondary | ICD-10-CM

## 2020-12-16 DIAGNOSIS — I340 Nonrheumatic mitral (valve) insufficiency: Secondary | ICD-10-CM | POA: Insufficient documentation

## 2020-12-16 MED ORDER — GADOBUTROL 1 MMOL/ML IV SOLN
6.0000 mL | Freq: Once | INTRAVENOUS | Status: AC | PRN
  Administered 2020-12-16: 6 mL via INTRAVENOUS

## 2020-12-24 DIAGNOSIS — G4733 Obstructive sleep apnea (adult) (pediatric): Secondary | ICD-10-CM | POA: Diagnosis not present

## 2020-12-25 NOTE — H&P (View-Only) (Signed)
Cardiology Office Note:    Date:  12/28/2020   ID:  Helen Washington, DOB [birth year], MRN [id number]  PCP:  Helen Solian, MD  Cardiologist:  None  Electrophysiologist:  None   Referring MD: Helen Solian, MD   Chief Complaint  Patient presents with   Congestive Heart Failure      History of Present Illness:    Helen Washington is a 75 y.o. female with a hx of hypertension, hyperlipidemia, PAD who presents for follow-up.  She was referred by Dr. Trula Washington for preop evaluation prior to bilateral femoral endarterectomy.  She was seen by Dr. Trula Washington for evaluation of claudication.  Angiogram on 05/15/2019 showed bilateral femoral artery occlusion, bilateral near occlusive popliteal stenosis.  Reports she has pain in her legs with minimal exertion, which limits her activity.  Up until 1 month ago she was doing water aerobics 3 times per week.  Would be in the pool for 3 hours.  She denies any exertional chest pain or dyspnea.  She quit smoking in 2002, smoked 1 pack/day x 45 years.  Denies any history of heart disease in her immediate family.  Coronary CTA on 06/30/2019 showed calcium score 1503 (97 percentile).  CT FFR showed severe stenosis in small nondominant circumflex artery (was read as occluded mid LAD and mid RCA on FFR but reviewed with Dr Helen Washington and appears to not be occluded but rather not modeled on FFR due to small vessel size <1.53mm.  Lexiscan Myoview on 08/24/2019 showed large perfusion defect in basal to apical inferior/inferoseptal, minimal reversibility, EF 42%.  Echocardiogram on 08/29/2019 showed LVEF 45 to 86%, grade 1 diastolic dysfunction, RVSP 43 mmHg, moderate MR.  Echocardiogram 11/04/2020 showed EF 45 to 50%, normal RV function, moderate to severe MR, RVSP 41 mmHg.  Cardiac MRI on 12/16/2020 showed subendocardial LGE consistent with prior infarcts in basal to apical inferior, mid inferolateral, and apex with nonviable mid inferolateral, apical inferior, and apex; EF 41%, normal  RV, mild to moderate MR (regurgitant fraction 18%).  Since last clinic visit, she reports that she has been doing well.  Denies any chest pain but does report she has been having some dyspnea with exertion.  Denies any lightheadedness or syncope.  She continues to exercise regularly with water aerobics.   BP Readings from Last 3 Encounters:  12/26/20 130/68  11/14/20 138/66  10/08/20 138/71      Past Medical History:  Diagnosis Date   Arthritis    Complication of anesthesia    woke up during colonoscopy   Hyperlipidemia    Hypertension    Insomnia    Sleep apnea     Past Surgical History:  Procedure Laterality Date   ABDOMINAL AORTOGRAM W/LOWER EXTREMITY Bilateral 05/15/2019   Procedure: ABDOMINAL AORTOGRAM W/LOWER EXTREMITY;  Surgeon: Serafina Mitchell, MD;  Location: [facility];  Service: Cardiovascular;  Laterality: Bilateral;   ABDOMINAL AORTOGRAM W/LOWER EXTREMITY N/A 08/26/2020   Procedure: ABDOMINAL AORTOGRAM W/LOWER EXTREMITY;  Surgeon: Serafina Mitchell, MD;  Location: [facility];  Service: Cardiovascular;  Laterality: N/A;   CATARACT EXTRACTION, BILATERAL  12/2015;01/2016   [facility]   COLONOSCOPY     ENDARTERECTOMY FEMORAL Bilateral 09/13/2019   Procedure: BILATERAL FEMORAL ENDARTERECTOMY with Patch Angioplasty;  Surgeon: Serafina Mitchell, MD;  Location: [facility];  Service: Vascular;  Laterality: Bilateral;   INSERTION OF ILIAC STENT Left 09/13/2019   Procedure: INSERTION OF LEFT ILIAC STENT;  Surgeon: Serafina Mitchell, MD;  Location[facility];  Service: Vascular;  Laterality: Left;   PATCH ANGIOPLASTY Bilateral 09/13/2019   Procedure: PATCH ANGIOPLASTY Bilateral;  Surgeon: Serafina Mitchell, MD;  Location: [facility];  Service: Vascular;  Laterality: Bilateral;   PERIPHERAL VASCULAR INTERVENTION Right 08/26/2020   Procedure: PERIPHERAL VASCULAR INTERVENTION;  Surgeon: Serafina Mitchell, MD;  Location: [facility];  Service: Cardiovascular;   Laterality: Right;  femoral popliteal artery   TUBAL LIGATION  1969    Current Medications: Current Meds  Medication Sig   alendronate (FOSAMAX) 70 MG tablet Take 70 mg by mouth every Monday. Take with a full glass of water on an empty stomach.   aspirin EC 81 MG tablet Take 1 tablet (81 mg total) by mouth daily.   carvedilol (COREG) 3.125 MG tablet TAKE 1 TABLET (3.125 MG TOTAL) BY MOUTH 2 (TWO) TIMES DAILY.   Cholecalciferol (VITAMIN D3) 50 MCG (2000 UT) CAPS Take 2,000 Units by mouth daily.   clopidogrel (PLAVIX) 75 MG tablet Take 1 tablet (75 mg total) by mouth daily.   losartan (COZAAR) 25 MG tablet TAKE 1 TABLET BY MOUTH EVERY DAY   rosuvastatin (CRESTOR) 40 MG tablet Take 1 tablet (40 mg total) by mouth daily.     Allergies:   Codeine   Social History   Socioeconomic History   Marital status: Divorced    Spouse name: Not on file   Number of children: 1   Years of education: 12   Highest education level: Not on file  Occupational History    Comment: retired Scientist, clinical (histocompatibility and immunogenetics), Sport and exercise psychologist  Tobacco Use   Smoking status: Former    Packs/day: 1.00    Years: 36.00    Pack years: 36.00    Types: Cigarettes    Quit date: 04/01/2000    Years since quitting: 20.7   Smokeless tobacco: Never  Vaping Use   Vaping Use: Never used  Substance and Sexual Activity   Alcohol use: Yes    Comment: 1 glass wine per week   Drug use: No   Sexual activity: Not on file  Other Topics Concern   Not on file  Social History Narrative   Lives alone   Caffeine - [place], 1 cup daily; maybe 4 teas per week   Right handed   Social Determinants of Health   Financial Resource Strain: Not on file  Food Insecurity: Not on file  Transportation Needs: Not on file  Physical Activity: Not on file  Stress: Not on file  Social Connections: Not on file     Family History: The patient's family history includes Anxiety disorder in her brother; Arthritis in her mother; Breast cancer in her cousin; CVA in her  mother; Cancer in her father; Diverticulitis in her mother; Diverticulosis in her sister; Healthy in her daughter; Hypertension in her brother and sister; Kidney Stones in her brother; Stroke in her mother; Suicidality in her brother; Ulcers in her brother.  ROS:   Please see the history of present illness.      All other systems reviewed and are negative.  EKGs/Labs/Other Studies Reviewed:    The following studies were reviewed today:   EKG:   06/06/2019: sinus rhythm, PVC, nonspecific T wave flattening, less than 1 mm ST depression in V6 07/13/2019: EKG was not ordered. 08/07/2019: EKG was not ordered. 05/14/2020: Sinus rhythm. Rate: 55 bpm. Q waves in V1/V2. Less than 1 mm ST depression in leads I, II, V5 and V6.  11/14/20: Sinus rhythm, rate 62, Q waves in  V1/2, T wave inversions in leads III, aVF, less than 1 mm ST depression in leads V5/6  Recent Labs: 12/26/2020: BUN 16; Creatinine, Ser 1.26; Hemoglobin 12.1; Platelets 198; Potassium 4.4; Sodium 139  Recent Lipid Panel    Component Value Date/Time   CHOL 172 11/14/2020 1019   TRIG 60 11/14/2020 1019   HDL 82 11/14/2020 1019   CHOLHDL 2.1 11/14/2020 1019   CHOLHDL 2.0 09/14/2019 0229   VLDL 13 09/14/2019 0229   LDLCALC 78 11/14/2020 1019    Physical Exam:    VS:  BP 130/68    Ht 5' (1.524 m)    Wt 122 lb 3.2 oz (55.4 kg)    SpO2 99%    BMI 23.87 kg/m     Wt Readings from Last 3 Encounters:  12/26/20 122 lb 3.2 oz (55.4 kg)  11/14/20 123 lb (55.8 kg)  10/08/20 121 lb (54.9 kg)     GEN:  in no acute distress HEENT: Normal NECK: No JVD; left carotid bruit CARDIAC: RRR, no murmurs, rubs, gallops RESPIRATORY:  Clear to auscultation without rales, wheezing or rhonchi  ABDOMEN: Soft, non-tender, non-distended MUSCULOSKELETAL:  No edema; No deformity  SKIN: Warm and dry NEUROLOGIC:  Alert and oriented x 3 PSYCHIATRIC:  Normal affect   ASSESSMENT:    1. CAD in native artery   2. Chronic systolic heart failure ([place])    3. Mitral valve insufficiency, unspecified etiology   4. PAD (peripheral artery disease) ([place])   5. Essential hypertension   6. Hyperlipidemia, unspecified hyperlipidemia type      PLAN:    Coronary artery disease:Coronary CTA on 06/30/2019 showed calcium score 1503 (97 percentile).  CT FFR showed severe stenosis in small nondominant circumflex artery (was read as occluded mid LAD and mid RCA on FFR but reviewed with Dr Helen Washington and appears to not be occluded but rather not modeled on FFR due to small vessel size <1.69mm). Denies any anginal symptoms.  Lexiscan Myoview on 08/24/2019 showed large perfusion defect in basal to apical inferior/inferoseptal, minimal reversibility, EF 42%.  Echocardiogram on 08/29/2019 showed LVEF 45 to 85%, grade 1 diastolic dysfunction, RVSP 43 mmHg, moderate MR. Echocardiogram 11/04/2020 showed EF 45 to 50%, normal RV function, moderate to severe MR, RVSP 41 mmHg.  Cardiac MRI on 12/16/2020 showed subendocardial LGE consistent with prior infarcts in basal to apical inferior, mid inferolateral, and apex with nonviable mid inferolateral, apical inferior, and apex; EF 41%, normal RV, mild to moderate MR (regurgitant fraction 18%). -Continue aspirin 81 mg daily -On rosuvastatin 20 mg daily LDL 78 on 11/14/2020, rosuvastatin increased to 40 mg daily -Cardiac MRI suggests multivessel CAD, recommend cath.  Risks and benefits of cardiac catheterization have been discussed with the patient.  These include bleeding, infection, kidney damage, stroke, heart attack, death.  The patient understands these risks and is willing to proceed.  Chronic systolic heart failure: EF 45 to 50% on echocardiogram on 08/29/2019.  Stable at 45 to 50% on echo 11/04/2020.  EF 41% on CMR 12/16/2020.  Appears euvolemic. -Continue carvedilol 3.125 mg twice daily -Continue losartan 25 mg daily  Miltral regurgitation: Moderate on echocardiogram 08/29/2019.  Echocardiogram 11/04/2020 showed moderate to severe MR  with eccentric posterior directed MR jet, could be due to restricted posterior leaflet, although some images suggested hockey-stick deformity of the anterior leaflet which could indicate possible rheumatic disease.   -No murmur on exam. Cardiac MRI shows mild to moderate MR (regurgitant fraction 18%)  PAD: Angiogram on 05/15/2019 showed bilateral  femoral artery occlusion, bilateral near occlusive popliteal stenosis.  Status post bilateral femoral endarterectomy on 09/13/2019.  Underwent stent to right superficial femoral and popliteal artery on 08/26/2020.  Continue aspirin and rosuvastatin.  Hypertension: On carvedilol 3.25 mg twice daily and losartan 25 mg daily.  Appears controlled.  Hyperlipidemia: On rosuvastatin 20 mg daily LDL 78 on 11/14/2020, rosuvastatin increased to 40 mg daily  Left carotid bruit: Carotid duplex 05/01/2019 showed 1 to 39% ICA stenosis bilaterally  RTC in 1 month   Medication Adjustments/Labs and Tests Ordered: Current medicines are reviewed at length with the patient today.  Concerns regarding medicines are outlined above.  Orders Placed This Encounter  Procedures   Basic metabolic panel   CBC   LEFT HEART CATH     No orders of the defined types were placed in this encounter.   Patient Instructions  Medication Instructions:  NO CHANGES *If you need a refill on your cardiac medications before your next appointment, please call your pharmacy*   Lab Work: BMET & CBC today  If you have labs (blood work) drawn today and your tests are completely normal, you will receive your results only by: [place] (if you have MyChart) OR A paper copy in the mail If you have any lab test that is abnormal or we need to change your treatment, we will call you to review the results.   Testing/Procedures: Left Heart Cath @ [facility] Tuesday 12/30/20 with Dr. Shelva Majestic   Follow-Up: At [facility], you and your health needs are our priority.  As part of  our continuing mission to provide you with exceptional heart care, we have created designated Provider Care Teams.  These Care Teams include your primary Cardiologist (physician) and Advanced Practice Providers (APPs -  Physician Assistants and Nurse Practitioners) who all work together to provide you with the care you need, when you need it.  We recommend signing up for the patient portal called "MyChart".  Sign up information is provided on this After Visit Summary.  MyChart is used to connect with patients for Virtual Visits (Telemedicine).  Patients are able to view lab/test results, encounter notes, upcoming appointments, etc.  Non-urgent messages can be sent to your provider as well.   To learn more about what you can do with MyChart, go to [email or web address].    Your next appointment:   01/07/21 as scheduled  Other Instructions  [address] [place] [address] Dept: [phone] Loc: [place]  12/26/2020  You are scheduled for a Cardiac Catheterization on Tuesday, December 13 with Dr. Shelva Majestic.  1. Please arrive at the [facility] (Main Entrance A) at [facility]: [address] [place], [place] [address] at 8:30 AM (This time is two hours before your procedure to ensure your preparation). Free valet parking service is available.   Special note: Every effort is made to have your procedure done on time. Please understand that emergencies sometimes delay scheduled procedures.  2. Diet: Do not eat solid foods after midnight.  The patient may have clear liquids until 5am upon the day of the procedure.  3. Labs: CBC & BMET today  4. Medication instructions in preparation for your procedure:   On the morning of your procedure, take your  ASA/Clopidogrel  and any morning medicines NOT listed above.  You may use sips of water.  5. Plan for one night  stay--bring personal  belongings. 6. Bring a current list of your medications and current insurance cards. 7. You MUST have a responsible person to drive you home. 8. Someone MUST be with you the first 24 hours after you arrive home or your discharge will be delayed. 9. Please wear clothes that are easy to get on and off and wear slip-on shoes.  Thank you for allowing [place] to care for you!   -- [facility] Invasive Cardiovascular services       Signed, Donato Heinz, MD  12/28/2020 10:14 PM    [place]

## 2020-12-25 NOTE — Progress Notes (Signed)
Cardiology Office Note:    Date:  12/28/2020   ID:  Helen [place], DOB Dec 07, 1945, MRN [id number]  PCP:  Prince Solian, MD  Cardiologist:  None  Electrophysiologist:  None   Referring MD: Prince Solian, MD   Chief Complaint  Patient presents with   Congestive Heart Failure      History of Present Illness:    Helen Washington is a 75 y.o. female with a hx of hypertension, hyperlipidemia, PAD who presents for follow-up.  She was referred by Dr. Trula Slade for preop evaluation prior to bilateral femoral endarterectomy.  She was seen by Dr. Trula Slade for evaluation of claudication.  Angiogram on 05/15/2019 showed bilateral femoral artery occlusion, bilateral near occlusive popliteal stenosis.  Reports she has pain in her legs with minimal exertion, which limits her activity.  Up until 1 month ago she was doing water aerobics 3 times per week.  Would be in the pool for 3 hours.  She denies any exertional chest pain or dyspnea.  She quit smoking in 2002, smoked 1 pack/day x 45 years.  Denies any history of heart disease in her immediate family.  Coronary CTA on 06/30/2019 showed calcium score 1503 (97 percentile).  CT FFR showed severe stenosis in small nondominant circumflex artery (was read as occluded mid LAD and mid RCA on FFR but reviewed with Dr Meda Coffee and appears to not be occluded but rather not modeled on FFR due to small vessel size <1.66mm.  Lexiscan Myoview on 08/24/2019 showed large perfusion defect in basal to apical inferior/inferoseptal, minimal reversibility, EF 42%.  Echocardiogram on 08/29/2019 showed LVEF 45 to 17%, grade 1 diastolic dysfunction, RVSP 43 mmHg, moderate MR.  Echocardiogram 11/04/2020 showed EF 45 to 50%, normal RV function, moderate to severe MR, RVSP 41 mmHg.  Cardiac MRI on 12/16/2020 showed subendocardial LGE consistent with prior infarcts in basal to apical inferior, mid inferolateral, and apex with nonviable mid inferolateral, apical inferior, and apex; EF 41%, normal  RV, mild to moderate MR (regurgitant fraction 18%).  Since last clinic visit, she reports that she has been doing well.  Denies any chest pain but does report she has been having some dyspnea with exertion.  Denies any lightheadedness or syncope.  She continues to exercise regularly with water aerobics.   BP Readings from Last 3 Encounters:  12/26/20 130/68  11/14/20 138/66  10/08/20 138/71      Past Medical History:  Diagnosis Date   Arthritis    Complication of anesthesia    woke up during colonoscopy   Hyperlipidemia    Hypertension    Insomnia    Sleep apnea     Past Surgical History:  Procedure Laterality Date   ABDOMINAL AORTOGRAM W/LOWER EXTREMITY Bilateral 05/15/2019   Procedure: ABDOMINAL AORTOGRAM W/LOWER EXTREMITY;  Surgeon: Serafina Mitchell, MD;  Location: [facility];  Service: Cardiovascular;  Laterality: Bilateral;   ABDOMINAL AORTOGRAM W/LOWER EXTREMITY N/A 08/26/2020   Procedure: ABDOMINAL AORTOGRAM W/LOWER EXTREMITY;  Surgeon: Serafina Mitchell, MD;  Location: [place] [facility];  Service: Cardiovascular;  Laterality: N/A;   CATARACT EXTRACTION, BILATERAL  12/2015;01/2016   [facility] [place]   COLONOSCOPY     ENDARTERECTOMY FEMORAL Bilateral 09/13/2019   Procedure: BILATERAL FEMORAL ENDARTERECTOMY with Patch Angioplasty;  Surgeon: Serafina Mitchell, MD;  Location: [facility];  Service: Vascular;  Laterality: Bilateral;   INSERTION OF ILIAC STENT Left 09/13/2019   Procedure: INSERTION OF LEFT ILIAC STENT;  Surgeon: Serafina Mitchell, MD;  [facility]  OR;  Service: Vascular;  Laterality: Left;   PATCH ANGIOPLASTY Bilateral 09/13/2019   Procedure: PATCH ANGIOPLASTY Bilateral;  Surgeon: Serafina Mitchell, MD;  Location: [facility];  Service: Vascular;  Laterality: Bilateral;   PERIPHERAL VASCULAR INTERVENTION Right 08/26/2020   Procedure: PERIPHERAL VASCULAR INTERVENTION;  Surgeon: Serafina Mitchell, MD;  Location: [facility];  Service: Cardiovascular;   Laterality: Right;  femoral popliteal artery   TUBAL LIGATION  1969    Current Medications: Current Meds  Medication Sig   alendronate (FOSAMAX) 70 MG tablet Take 70 mg by mouth every Monday. Take with a full glass of water on an empty stomach.   aspirin EC 81 MG tablet Take 1 tablet (81 mg total) by mouth daily.   carvedilol (COREG) 3.125 MG tablet TAKE 1 TABLET (3.125 MG TOTAL) BY MOUTH 2 (TWO) TIMES DAILY.   Cholecalciferol (VITAMIN D3) 50 MCG (2000 UT) CAPS Take 2,000 Units by mouth daily.   clopidogrel (PLAVIX) 75 MG tablet Take 1 tablet (75 mg total) by mouth daily.   losartan (COZAAR) 25 MG tablet TAKE 1 TABLET BY MOUTH EVERY DAY   rosuvastatin (CRESTOR) 40 MG tablet Take 1 tablet (40 mg total) by mouth daily.     Allergies:   Codeine   Social History   Socioeconomic History   Marital status: Divorced    Spouse name: Not on file   Number of children: 1   Years of education: 12   Highest education level: Not on file  Occupational History    Comment: retired Scientist, clinical (histocompatibility and immunogenetics), Sport and exercise psychologist  Tobacco Use   Smoking status: Former    Packs/day: 1.00    Years: 36.00    Pack years: 36.00    Types: Cigarettes    Quit date: 04/01/2000    Years since quitting: 20.7   Smokeless tobacco: Never  Vaping Use   Vaping Use: Never used  Substance and Sexual Activity   Alcohol use: Yes    Comment: 1 glass wine per week   Drug use: No   Sexual activity: Not on file  Other Topics Concern   Not on file  Social History Narrative   Lives alone   Caffeine - coffee, 1 cup daily; maybe 4 teas per week   Right handed   Social Determinants of Health   Financial Resource Strain: Not on file  Food Insecurity: Not on file  Transportation Needs: Not on file  Physical Activity: Not on file  Stress: Not on file  Social Connections: Not on file     Family History: The patient's family history includes Anxiety disorder in her brother; Arthritis in her mother; Breast cancer in her cousin; CVA in her  mother; Cancer in her father; Diverticulitis in her mother; Diverticulosis in her sister; Healthy in her daughter; Hypertension in her brother and sister; Kidney Stones in her brother; Stroke in her mother; Suicidality in her brother; Ulcers in her brother.  ROS:   Please see the history of present illness.      All other systems reviewed and are negative.  EKGs/Labs/Other Studies Reviewed:    The following studies were reviewed today:   EKG:   06/06/2019: sinus rhythm, PVC, nonspecific T wave flattening, less than 1 mm ST depression in V6 07/13/2019: EKG was not ordered. 08/07/2019: EKG was not ordered. 05/14/2020: Sinus rhythm. Rate: 55 bpm. Q waves in V1/V2. Less than 1 mm ST depression in leads I, II, V5 and V6.  11/14/20: Sinus rhythm, rate 62, Q waves in  V1/2, T wave inversions in leads III, aVF, less than 1 mm ST depression in leads V5/6  Recent Labs: 12/26/2020: BUN 16; Creatinine, Ser 1.26; Hemoglobin 12.1; Platelets 198; Potassium 4.4; Sodium 139  Recent Lipid Panel    Component Value Date/Time   CHOL 172 11/14/2020 1019   TRIG 60 11/14/2020 1019   HDL 82 11/14/2020 1019   CHOLHDL 2.1 11/14/2020 1019   CHOLHDL 2.0 09/14/2019 0229   VLDL 13 09/14/2019 0229   LDLCALC 78 11/14/2020 1019    Physical Exam:    VS:  BP 130/68   Ht 5' (1.524 m)   Wt 122 lb 3.2 oz (55.4 kg)   SpO2 99%   BMI 23.87 kg/m     Wt Readings from Last 3 Encounters:  12/26/20 122 lb 3.2 oz (55.4 kg)  11/14/20 123 lb (55.8 kg)  10/08/20 121 lb (54.9 kg)     GEN:  in no acute distress HEENT: Normal NECK: No JVD; left carotid bruit CARDIAC: RRR, no murmurs, rubs, gallops RESPIRATORY:  Clear to auscultation without rales, wheezing or rhonchi  ABDOMEN: Soft, non-tender, non-distended MUSCULOSKELETAL:  No edema; No deformity  SKIN: Warm and dry NEUROLOGIC:  Alert and oriented x 3 PSYCHIATRIC:  Normal affect   ASSESSMENT:    1. CAD in native artery   2. Chronic systolic heart failure ([place])    3. Mitral valve insufficiency, unspecified etiology   4. PAD (peripheral artery disease) ([place])   5. Essential hypertension   6. Hyperlipidemia, unspecified hyperlipidemia type      PLAN:    Coronary artery disease:Coronary CTA on 06/30/2019 showed calcium score 1503 (97 percentile).  CT FFR showed severe stenosis in small nondominant circumflex artery (was read as occluded mid LAD and mid RCA on FFR but reviewed with Dr Meda Coffee and appears to not be occluded but rather not modeled on FFR due to small vessel size <1.94mm). Denies any anginal symptoms.  Lexiscan Myoview on 08/24/2019 showed large perfusion defect in basal to apical inferior/inferoseptal, minimal reversibility, EF 42%.  Echocardiogram on 08/29/2019 showed LVEF 45 to 66%, grade 1 diastolic dysfunction, RVSP 43 mmHg, moderate MR. Echocardiogram 11/04/2020 showed EF 45 to 50%, normal RV function, moderate to severe MR, RVSP 41 mmHg.  Cardiac MRI on 12/16/2020 showed subendocardial LGE consistent with prior infarcts in basal to apical inferior, mid inferolateral, and apex with nonviable mid inferolateral, apical inferior, and apex; EF 41%, normal RV, mild to moderate MR (regurgitant fraction 18%). -Continue aspirin 81 mg daily -On rosuvastatin 20 mg daily LDL 78 on 11/14/2020, rosuvastatin increased to 40 mg daily -Cardiac MRI suggests multivessel CAD, recommend cath.  Risks and benefits of cardiac catheterization have been discussed with the patient.  These include bleeding, infection, kidney damage, stroke, heart attack, death.  The patient understands these risks and is willing to proceed.  Chronic systolic heart failure: EF 45 to 50% on echocardiogram on 08/29/2019.  Stable at 45 to 50% on echo 11/04/2020.  EF 41% on CMR 12/16/2020.  Appears euvolemic. -Continue carvedilol 3.125 mg twice daily -Continue losartan 25 mg daily  Miltral regurgitation: Moderate on echocardiogram 08/29/2019.  Echocardiogram 11/04/2020 showed moderate to severe MR  with eccentric posterior directed MR jet, could be due to restricted posterior leaflet, although some images suggested hockey-stick deformity of the anterior leaflet which could indicate possible rheumatic disease.   -No murmur on exam. Cardiac MRI shows mild to moderate MR (regurgitant fraction 18%)  PAD: Angiogram on 05/15/2019 showed bilateral femoral artery occlusion, bilateral  near occlusive popliteal stenosis.  Status post bilateral femoral endarterectomy on 09/13/2019.  Underwent stent to right superficial femoral and popliteal artery on 08/26/2020.  Continue aspirin and rosuvastatin.  Hypertension: On carvedilol 3.25 mg twice daily and losartan 25 mg daily.  Appears controlled.  Hyperlipidemia: On rosuvastatin 20 mg daily LDL 78 on 11/14/2020, rosuvastatin increased to 40 mg daily  Left carotid bruit: Carotid duplex 05/01/2019 showed 1 to 39% ICA stenosis bilaterally  RTC in 1 month   Medication Adjustments/Labs and Tests Ordered: Current medicines are reviewed at length with the patient today.  Concerns regarding medicines are outlined above.  Orders Placed This Encounter  Procedures   Basic metabolic panel   CBC   LEFT HEART CATH     No orders of the defined types were placed in this encounter.   Patient Instructions  Medication Instructions:  NO CHANGES *If you need a refill on your cardiac medications before your next appointment, please call your pharmacy*   Lab Work: BMET & CBC today  If you have labs (blood work) drawn today and your tests are completely normal, you will receive your results only by: [place] (if you have MyChart) OR A paper copy in the mail If you have any lab test that is abnormal or we need to change your treatment, we will call you to review the results.   Testing/Procedures: Left Heart Cath @ [facility] Tuesday 12/30/20 with Dr. Shelva Majestic   Follow-Up: At [facility], you and your health needs are our priority.  As part of  our continuing mission to provide you with exceptional heart care, we have created designated Provider Care Teams.  These Care Teams include your primary Cardiologist (physician) and Advanced Practice Providers (APPs -  Physician Assistants and Nurse Practitioners) who all work together to provide you with the care you need, when you need it.  We recommend signing up for the patient portal called "MyChart".  Sign up information is provided on this After Visit Summary.  MyChart is used to connect with patients for Virtual Visits (Telemedicine).  Patients are able to view lab/test results, encounter notes, upcoming appointments, etc.  Non-urgent messages can be sent to your provider as well.   To learn more about what you can do with MyChart, go to [email or web address].    Your next appointment:   01/07/21 as scheduled  Other Instructions  [place] [address] Dept: [phone] Loc: [place]  12/26/2020  You are scheduled for a Cardiac Catheterization on Tuesday, December 13 with Dr. Shelva Majestic.  1. Please arrive at the [facility] (Main Entrance A) at [facility]: [address] [place], [place] [address] at 8:30 AM (This time is two hours before your procedure to ensure your preparation). Free valet parking service is available.   Special note: Every effort is made to have your procedure done on time. Please understand that emergencies sometimes delay scheduled procedures.  2. Diet: Do not eat solid foods after midnight.  The patient may have clear liquids until 5am upon the day of the procedure.  3. Labs: CBC & BMET today  4. Medication instructions in preparation for your procedure:   On the morning of your procedure, take your  ASA/Clopidogrel  and any morning medicines NOT listed above.  You may use sips of water.  5. Plan for one night  stay--bring personal belongings. 6. Bring a  current list of your medications and current insurance cards. 7. You MUST have a responsible person to drive you home. 8. Someone MUST be with you the first 24 hours after you arrive home or your discharge will be delayed. 9. Please wear clothes that are easy to get on and off and wear slip-on shoes.  Thank you for allowing [place] to care for you!   -- [facility] Invasive Cardiovascular services       Signed, Donato Heinz, MD  12/28/2020 10:14 PM    [place]

## 2020-12-26 ENCOUNTER — Other Ambulatory Visit: Payer: Self-pay

## 2020-12-26 ENCOUNTER — Encounter: Payer: Self-pay | Admitting: Cardiology

## 2020-12-26 ENCOUNTER — Ambulatory Visit: Payer: Medicare HMO | Admitting: Cardiology

## 2020-12-26 ENCOUNTER — Other Ambulatory Visit: Payer: Self-pay | Admitting: Cardiology

## 2020-12-26 VITALS — BP 130/68 | Ht 60.0 in | Wt 122.2 lb

## 2020-12-26 DIAGNOSIS — I5022 Chronic systolic (congestive) heart failure: Secondary | ICD-10-CM

## 2020-12-26 DIAGNOSIS — E785 Hyperlipidemia, unspecified: Secondary | ICD-10-CM | POA: Diagnosis not present

## 2020-12-26 DIAGNOSIS — I2510 Atherosclerotic heart disease of native coronary artery without angina pectoris: Secondary | ICD-10-CM

## 2020-12-26 DIAGNOSIS — I340 Nonrheumatic mitral (valve) insufficiency: Secondary | ICD-10-CM

## 2020-12-26 DIAGNOSIS — I739 Peripheral vascular disease, unspecified: Secondary | ICD-10-CM | POA: Diagnosis not present

## 2020-12-26 DIAGNOSIS — I10 Essential (primary) hypertension: Secondary | ICD-10-CM

## 2020-12-26 LAB — CBC
Hematocrit: 35.1 % (ref 34.0–46.6)
Hemoglobin: 12.1 g/dL (ref 11.1–15.9)
MCH: 31.3 pg (ref 26.6–33.0)
MCHC: 34.5 g/dL (ref 31.5–35.7)
MCV: 91 fL (ref 79–97)
Platelets: 198 10*3/uL (ref 150–450)
RBC: 3.86 x10E6/uL (ref 3.77–5.28)
RDW: 12.6 % (ref 11.7–15.4)
WBC: 6.8 10*3/uL (ref 3.4–10.8)

## 2020-12-26 LAB — BASIC METABOLIC PANEL
BUN/Creatinine Ratio: 13 (ref 12–28)
BUN: 16 mg/dL (ref 8–27)
CO2: 22 mmol/L (ref 20–29)
Calcium: 9.8 mg/dL (ref 8.7–10.3)
Chloride: 104 mmol/L (ref 96–106)
Creatinine, Ser: 1.26 mg/dL — ABNORMAL HIGH (ref 0.57–1.00)
Glucose: 88 mg/dL (ref 70–99)
Potassium: 4.4 mmol/L (ref 3.5–5.2)
Sodium: 139 mmol/L (ref 134–144)
eGFR: 45 mL/min/{1.73_m2} — ABNORMAL LOW (ref >59)

## 2020-12-26 NOTE — Patient Instructions (Signed)
Medication Instructions:  NO CHANGES *If you need a refill on your cardiac medications before your next appointment, please call your pharmacy*   Lab Work: BMET & CBC today  If you have labs (blood work) drawn today and your tests are completely normal, you will receive your results only by: [place] (if you have MyChart) OR A paper copy in the mail If you have any lab test that is abnormal or we need to change your treatment, we will call you to review the results.   Testing/Procedures: Left Heart Cath @ [facility] Tuesday 12/30/20 with Dr. Shelva Majestic   Follow-Up: At [facility], you and your health needs are our priority.  As part of our continuing mission to provide you with exceptional heart care, we have created designated Provider Care Teams.  These Care Teams include your primary Cardiologist (physician) and Advanced Practice Providers (APPs -  Physician Assistants and Nurse Practitioners) who all work together to provide you with the care you need, when you need it.  We recommend signing up for the patient portal called "MyChart".  Sign up information is provided on this After Visit Summary.  MyChart is used to connect with patients for Virtual Visits (Telemedicine).  Patients are able to view lab/test results, encounter notes, upcoming appointments, etc.  Non-urgent messages can be sent to your provider as well.   To learn more about what you can do with MyChart, go to [email or web address].    Your next appointment:   01/07/21 as scheduled  Other Instructions  [place] [address] Dept: [phone] Loc: [place]  12/26/2020  You are scheduled for a Cardiac Catheterization on Tuesday, December 13 with Dr. Shelva Majestic.  1. Please arrive at the [facility] (Main Entrance A) at [facility]: [address]  [place], [place] [address] at 8:30 AM (This time is two hours before your procedure to ensure your preparation). Free valet parking service is available.   Special note: Every effort is made to have your procedure done on time. Please understand that emergencies sometimes delay scheduled procedures.  2. Diet: Do not eat solid foods after midnight.  The patient may have clear liquids until 5am upon the day of the procedure.  3. Labs: CBC & BMET today  4. Medication instructions in preparation for your procedure:   On the morning of your procedure, take your  ASA/Clopidogrel  and any morning medicines NOT listed above.  You may use sips of water.  5. Plan for one night stay--bring personal belongings. 6. Bring a current list of your medications and current insurance cards. 7. You MUST have a responsible person to drive you home. 8. Someone MUST be with you the first 24 hours after you arrive home or your discharge will be delayed. 9. Please wear clothes that are easy to get on and off and wear slip-on shoes.  Thank you for allowing [place] to care for you!   -- [place] Invasive Cardiovascular services

## 2020-12-29 ENCOUNTER — Telehealth: Payer: Self-pay | Admitting: *Deleted

## 2020-12-29 ENCOUNTER — Ambulatory Visit (HOSPITAL_COMMUNITY)
Admission: RE | Admit: 2020-12-29 | Discharge: 2020-12-29 | Disposition: A | Discharge status: Home / Self Care | Payer: Medicare HMO | Source: Ambulatory Visit | Attending: Physician Assistant | Admitting: Physician Assistant

## 2020-12-29 ENCOUNTER — Ambulatory Visit (INDEPENDENT_AMBULATORY_CARE_PROVIDER_SITE_OTHER)
Admission: RE | Admit: 2020-12-29 | Discharge: 2020-12-29 | Disposition: A | Discharge status: Home / Self Care | Payer: Medicare HMO | Source: Ambulatory Visit | Attending: Physician Assistant | Admitting: Physician Assistant

## 2020-12-29 ENCOUNTER — Ambulatory Visit: Payer: Medicare HMO | Admitting: Physician Assistant

## 2020-12-29 ENCOUNTER — Other Ambulatory Visit: Payer: Self-pay

## 2020-12-29 VITALS — BP 148/74 | HR 58 | Temp 97.5°F | Resp 20 | Ht 60.0 in | Wt 120.8 lb

## 2020-12-29 DIAGNOSIS — I739 Peripheral vascular disease, unspecified: Secondary | ICD-10-CM

## 2020-12-29 DIAGNOSIS — I70213 Atherosclerosis of native arteries of extremities with intermittent claudication, bilateral legs: Secondary | ICD-10-CM

## 2020-12-29 DIAGNOSIS — R0989 Other specified symptoms and signs involving the circulatory and respiratory systems: Secondary | ICD-10-CM | POA: Diagnosis not present

## 2020-12-29 DIAGNOSIS — I2510 Atherosclerotic heart disease of native coronary artery without angina pectoris: Secondary | ICD-10-CM | POA: Insufficient documentation

## 2020-12-29 DIAGNOSIS — K5904 Chronic idiopathic constipation: Secondary | ICD-10-CM | POA: Insufficient documentation

## 2020-12-29 DIAGNOSIS — I5022 Chronic systolic (congestive) heart failure: Secondary | ICD-10-CM | POA: Insufficient documentation

## 2020-12-29 NOTE — Progress Notes (Signed)
HISTORY AND PHYSICAL     CC:  follow up. Requesting Provider:  Prince Solian, MD  HPI: This is a 75 y.o. female who is here today for follow up for PAD.  She has hx of right SFA and popliteal artery stenting by Dr. Trula Slade on 08/26/2020 due to claudication.  She has previously undergone bilateral femoral endarterectomies by Dr. Trula Slade on 09/13/2019.    Pt was last seen 09/29/2020, and at that time, she was saying active doing water aerobics three times per week for 2 hours each time.  She was having some discomfort in her left calf but was tolerable.   The pt returns today for follow up.  She denies any non healing wounds or rest pain.  She states that she has discomfort in her left calf after she walks the length of a grocery store [place].  She stops and this improves and she can walk the next aisle.  She inquires if this discomfort can be due to her statin.  She denies this pain in the right leg.  In fact, she state the right leg is much improved.  She denies any amaurosis fugax, speech issues, unilateral weakness, numbness or paralysis.    She states that she is getting a heart catheterization tomorrow.   The pt is on a statin for cholesterol management.    The pt is on an aspirin.    Other AC:  Plavix The pt is on ARB, BB for hypertension.  The pt does not have diabetes. Tobacco hx:  former   Past Medical History:  Diagnosis Date   Arthritis    Complication of anesthesia    woke up during colonoscopy   Hyperlipidemia    Hypertension    Insomnia    Sleep apnea     Past Surgical History:  Procedure Laterality Date   ABDOMINAL AORTOGRAM W/LOWER EXTREMITY Bilateral 05/15/2019   Procedure: ABDOMINAL AORTOGRAM W/LOWER EXTREMITY;  Surgeon: Serafina Mitchell, MD;  Location: [facility];  Service: Cardiovascular;  Laterality: Bilateral;   ABDOMINAL AORTOGRAM W/LOWER EXTREMITY N/A 08/26/2020   Procedure: ABDOMINAL AORTOGRAM W/LOWER EXTREMITY;  Surgeon: Serafina Mitchell, MD;   Location: [facility];  Service: Cardiovascular;  Laterality: N/A;   CATARACT EXTRACTION, BILATERAL  12/2015;01/2016   [facility]   COLONOSCOPY     ENDARTERECTOMY FEMORAL Bilateral 09/13/2019   Procedure: BILATERAL FEMORAL ENDARTERECTOMY with Patch Angioplasty;  Surgeon: Serafina Mitchell, MD;  Location: [facility];  Service: Vascular;  Laterality: Bilateral;   INSERTION OF ILIAC STENT Left 09/13/2019   Procedure: INSERTION OF LEFT ILIAC STENT;  Surgeon: Serafina Mitchell, MD;  Location: [facility];  Service: Vascular;  Laterality: Left;   PATCH ANGIOPLASTY Bilateral 09/13/2019   Procedure: PATCH ANGIOPLASTY Bilateral;  Surgeon: Serafina Mitchell, MD;  Location: [facility];  Service: Vascular;  Laterality: Bilateral;   PERIPHERAL VASCULAR INTERVENTION Right 08/26/2020   Procedure: PERIPHERAL VASCULAR INTERVENTION;  Surgeon: Serafina Mitchell, MD;  Location: [facility];  Service: Cardiovascular;  Laterality: Right;  femoral popliteal artery   TUBAL LIGATION  1969    Allergies  Allergen Reactions   Codeine Nausea Only    Current Outpatient Medications  Medication Sig Dispense Refill   alendronate (FOSAMAX) 70 MG tablet Take 70 mg by mouth every Monday. Take with a full glass of water on an empty stomach.     aspirin EC 81 MG tablet Take 1 tablet (81 mg total) by mouth daily. 90 tablet  3   Aspirin-Caffeine (BC FAST PAIN RELIEF PO) Take 1 packet by mouth daily as needed (headaches).     carvedilol (COREG) 3.125 MG tablet TAKE 1 TABLET (3.125 MG TOTAL) BY MOUTH 2 (TWO) TIMES DAILY. 180 tablet 3   Cholecalciferol (VITAMIN D3) 50 MCG (2000 UT) CAPS Take 2,000 Units by mouth daily.     clopidogrel (PLAVIX) 75 MG tablet Take 1 tablet (75 mg total) by mouth daily. 30 tablet 11   losartan (COZAAR) 25 MG tablet TAKE 1 TABLET BY MOUTH EVERY DAY 90 tablet 1   rosuvastatin (CRESTOR) 40 MG tablet Take 1 tablet (40 mg total) by mouth daily. 90 tablet 3   No current facility-administered  medications for this visit.    Family History  Problem Relation Age of Onset   Stroke Mother    Arthritis Mother    CVA Mother    Diverticulitis Mother    Cancer Father    Hypertension Sister    Diverticulosis Sister    Breast cancer Cousin    Suicidality Brother    Hypertension Brother    Anxiety disorder Brother    Kidney Stones Brother    Ulcers Brother    Healthy Daughter     Social History   Socioeconomic History   Marital status: Divorced    Spouse name: Not on file   Number of children: 1   Years of education: 12   Highest education level: Not on file  Occupational History    Comment: retired Scientist, clinical (histocompatibility and immunogenetics), Sport and exercise psychologist  Tobacco Use   Smoking status: Former    Packs/day: 1.00    Years: 36.00    Pack years: 36.00    Types: Cigarettes    Quit date: 04/01/2000    Years since quitting: 20.7   Smokeless tobacco: Never  Vaping Use   Vaping Use: Never used  Substance and Sexual Activity   Alcohol use: Yes    Comment: 1 glass wine per week   Drug use: No   Sexual activity: Not on file  Other Topics Concern   Not on file  Social History Narrative   Lives alone   Caffeine - coffee, 1 cup daily; maybe 4 teas per week   Right handed   Social Determinants of Health   Financial Resource Strain: Not on file  Food Insecurity: Not on file  Transportation Needs: Not on file  Physical Activity: Not on file  Stress: Not on file  Social Connections: Not on file  Intimate Partner Violence: Not on file     REVIEW OF SYSTEMS:   [X]  denotes positive finding, [ ]  denotes negative finding Cardiac  Comments:  Chest pain or chest pressure:    Shortness of breath upon exertion: x   Short of breath when lying flat:    Irregular heart rhythm:        Vascular    Pain in calf, thigh, or hip brought on by ambulation: x See HPI  Pain in feet at night that wakes you up from your sleep:     Blood clot in your veins:    Leg swelling:         Pulmonary    Oxygen at home:     Productive cough:     Wheezing:         Neurologic    Sudden weakness in arms or legs:     Sudden numbness in arms or legs:     Sudden onset of difficulty speaking or slurred speech:  Temporary loss of vision in one eye:     Problems with dizziness:         Gastrointestinal    Blood in stool:     Vomited blood:         Genitourinary    Burning when urinating:     Blood in urine:        Psychiatric    Major depression:         Hematologic    Bleeding problems:    Problems with blood clotting too easily:        Skin    Rashes or ulcers:        Constitutional    Fever or chills:      PHYSICAL EXAMINATION:  Today's Vitals   12/29/20 1042  BP: (!) 148/74  Pulse: (!) 58  Resp: 20  Temp: (!) 97.5 F (36.4 C)  TempSrc: Temporal  SpO2: 95%  Weight: 120 lb 12.8 oz (54.8 kg)  Height: 5' (1.524 m)  PainSc: 6   PainLoc: Leg   Body mass index is 23.59 kg/m.   General:  WDWN in NAD; vital signs documented above Gait: Not observed HENT: WNL, normocephalic Pulmonary: normal non-labored breathing , without wheezing Cardiac: regular HR, with carotid bruit on the left Abdomen: soft, NT Skin: without rashes Vascular Exam/Pulses: Palpable right PT pulse; monophasic left PT and peroneal doppler signals.  Bilateral radial pulses are palpable.  Extremities: without ischemic changes, without Gangrene , without cellulitis; without open wounds;  Musculoskeletal: no muscle wasting or atrophy  Neurologic: A&O X 3 Psychiatric:  The pt has Normal affect.   Non-Invasive Vascular Imaging:   ABI's/TBI's on 12/29/2020: Right:  1.06/0.60 - Great toe pressure: 96 Left:  0.57/0.32 - Great toe pressure: 52  RLE Arterial duplex on 12/29/2020: Right Stent(s):  +---------------+--------+--------+--------+--------+  SFA            PSV cm/sStenosisWaveformComments  +---------------+--------+--------+--------+--------+  Prox to Stent  125             biphasic           +---------------+--------+--------+--------+--------+  Proximal Stent 75              biphasic          +---------------+--------+--------+--------+--------+  Mid Stent      61              biphasic          +---------------+--------+--------+--------+--------+  Distal Stent   56              biphasic          +---------------+--------+--------+--------+--------+  Distal to Stent68              biphasic          +---------------+--------+--------+--------+--------+      Right Stent(s):  +---------------+--------+--------+--------+--------+  popliteal      PSV cm/sStenosisWaveformComments  +---------------+--------+--------+--------+--------+  Prox to Stent  115             biphasic          +---------------+--------+--------+--------+--------+  Proximal Stent 64              biphasic          +---------------+--------+--------+--------+--------+  Mid Stent      62              biphasic          +---------------+--------+--------+--------+--------+  Distal Stent   77  biphasic          +---------------+--------+--------+--------+--------+  Distal to Stent60              biphasic          +---------------+--------+--------+--------+--------+   Summary:  Right: Widely patent SFA and popliteal artery stenting without evidence of  stenosis.     ASSESSMENT/PLAN:: 75 y.o. female here for follow up for PAD with hx of right SFA and popliteal artery stenting by Dr. Trula Slade on 08/26/2020 due to claudication.  She has previously undergone bilateral femoral endarterectomies by Dr. Trula Slade on 09/13/2019.    PAD -pt with palpable right PT pulse.  Her ABI's are essentially unchanged from previous visit.  Her RLE arterial duplex reveals patent stent without stenosis.   -she has ABI on th eleft of 0.57.  she does have mild claudication symptoms on the left that improve with rest.  I discussed with pt that I do not feel this is  related to her statin given she does not have this discomfort in the right leg.   Encouraged her to stay active and increase her walking program.   -pt will f/u in 6 months with ABI and RLE arterial duplex. -continue plavix/statin/asa  Left Carotid bruit -pt denies any stroke symptoms.  She did have a carotid duplex in April 2021 that revealed 1-39% bilateral ICA stenosis.  Given she has bruit and this has not been repeated, will bring her back at the beginning of the year to check duplex.  She knows that if she develops any stroke symptoms she should call 911.    Leontine Locket, [facility] [phone]  Clinic MD:   Trula Slade

## 2020-12-29 NOTE — Telephone Encounter (Addendum)
Cardiac catheterization scheduled at [facility] for: Tuesday December 30, 2020 10:30 AM [facility] Main Entrance [facility]) at: 5:30 AM-pre-procedure hydration-per protocol GFR 45   Diet-no solid food after midnight prior to cath, clear liquids until 5 AM day of procedure.  Medication instructions for procedure: -Hold:  Losartan-day before and day of procedure-GFR 45-already taken today -Except hold medications usual morning medications can be taken pre-cath with sips of water including aspirin 81 mg and Plavix 75 mg.    Confirmed patient has responsible adult to drive home post procedure and be with patient first 24 hours after arriving home.  [facility] does allow one visitor to accompany you and wait in the hospital waiting room while you are there for your procedure. You and your visitor will be asked to wear a mask once you enter the hospital.   Patient reports does not currently have any new symptoms concerning for COVID-19 and no household members with COVID-19 like illness.     Reviewed procedure/mask/visitor instructions with patient.

## 2020-12-30 ENCOUNTER — Ambulatory Visit (HOSPITAL_COMMUNITY)
Admission: RE | Admit: 2020-12-30 | Discharge: 2020-12-30 | Disposition: A | Discharge status: Home / Self Care | Payer: Medicare HMO | Attending: Cardiovascular Disease | Admitting: Cardiovascular Disease

## 2020-12-30 ENCOUNTER — Other Ambulatory Visit: Payer: Self-pay

## 2020-12-30 ENCOUNTER — Encounter (HOSPITAL_COMMUNITY)
Admission: RE | Disposition: A | Discharge status: Home / Self Care | Payer: Self-pay | Source: Home / Self Care | Attending: Cardiovascular Disease

## 2020-12-30 DIAGNOSIS — I340 Nonrheumatic mitral (valve) insufficiency: Secondary | ICD-10-CM | POA: Insufficient documentation

## 2020-12-30 DIAGNOSIS — I739 Peripheral vascular disease, unspecified: Secondary | ICD-10-CM | POA: Diagnosis not present

## 2020-12-30 DIAGNOSIS — I5022 Chronic systolic (congestive) heart failure: Secondary | ICD-10-CM | POA: Insufficient documentation

## 2020-12-30 DIAGNOSIS — R931 Abnormal findings on diagnostic imaging of heart and coronary circulation: Secondary | ICD-10-CM

## 2020-12-30 DIAGNOSIS — I110 Hypertensive heart disease with heart failure: Secondary | ICD-10-CM | POA: Diagnosis not present

## 2020-12-30 DIAGNOSIS — Z87891 Personal history of nicotine dependence: Secondary | ICD-10-CM | POA: Diagnosis not present

## 2020-12-30 DIAGNOSIS — I2582 Chronic total occlusion of coronary artery: Secondary | ICD-10-CM | POA: Insufficient documentation

## 2020-12-30 DIAGNOSIS — E785 Hyperlipidemia, unspecified: Secondary | ICD-10-CM | POA: Insufficient documentation

## 2020-12-30 DIAGNOSIS — I2510 Atherosclerotic heart disease of native coronary artery without angina pectoris: Secondary | ICD-10-CM | POA: Diagnosis not present

## 2020-12-30 HISTORY — PX: LEFT HEART CATH AND CORONARY ANGIOGRAPHY: CATH118249

## 2020-12-30 SURGERY — LEFT HEART CATH AND CORONARY ANGIOGRAPHY
Anesthesia: LOCAL

## 2020-12-30 MED ORDER — HEPARIN SODIUM (PORCINE) 1000 UNIT/ML IJ SOLN
INTRAMUSCULAR | Status: AC
  Filled 2020-12-30: qty 10

## 2020-12-30 MED ORDER — MIDAZOLAM HCL 2 MG/2ML IJ SOLN
INTRAMUSCULAR | Status: DC | PRN
  Administered 2020-12-30 (×2): 1 mg via INTRAVENOUS

## 2020-12-30 MED ORDER — HEPARIN (PORCINE) IN NACL 1000-0.9 UT/500ML-% IV SOLN
INTRAVENOUS | Status: DC | PRN
  Administered 2020-12-30 (×2): 500 mL

## 2020-12-30 MED ORDER — HEPARIN (PORCINE) IN NACL 2000-0.9 UNIT/L-% IV SOLN
INTRAVENOUS | Status: AC
  Filled 2020-12-30: qty 1000

## 2020-12-30 MED ORDER — FENTANYL CITRATE (PF) 100 MCG/2ML IJ SOLN
INTRAMUSCULAR | Status: DC | PRN
  Administered 2020-12-30: 25 ug via INTRAVENOUS

## 2020-12-30 MED ORDER — SODIUM CHLORIDE 0.9 % IV SOLN: 250.0000 mL | INTRAVENOUS | Status: DC | PRN

## 2020-12-30 MED ORDER — ACETAMINOPHEN 325 MG PO TABS: 650.0000 mg | ORAL_TABLET | ORAL | Status: DC | PRN

## 2020-12-30 MED ORDER — SODIUM CHLORIDE 0.9 % WEIGHT BASED INFUSION
1.0000 mL/kg/h | INTRAVENOUS | Status: DC
  Administered 2020-12-30: 1 mL/kg/h via INTRAVENOUS

## 2020-12-30 MED ORDER — IOHEXOL 350 MG/ML SOLN
INTRAVENOUS | Status: DC | PRN
  Administered 2020-12-30: 55 mL

## 2020-12-30 MED ORDER — CLOPIDOGREL BISULFATE 75 MG PO TABS: 75.0000 mg | ORAL_TABLET | Freq: Every day | ORAL | Status: DC

## 2020-12-30 MED ORDER — SODIUM CHLORIDE 0.9 % WEIGHT BASED INFUSION
3.0000 mL/kg/h | INTRAVENOUS | Status: AC
  Administered 2020-12-30: 3 mL/kg/h via INTRAVENOUS

## 2020-12-30 MED ORDER — LABETALOL HCL 5 MG/ML IV SOLN: 10.0000 mg | INTRAVENOUS | Status: DC | PRN

## 2020-12-30 MED ORDER — SODIUM CHLORIDE 0.9% FLUSH: 3.0000 mL | INTRAVENOUS | Status: DC | PRN

## 2020-12-30 MED ORDER — LIDOCAINE HCL (PF) 1 % IJ SOLN
INTRAMUSCULAR | Status: DC | PRN
  Administered 2020-12-30: 2 mL

## 2020-12-30 MED ORDER — ASPIRIN 81 MG PO CHEW: 81.0000 mg | CHEWABLE_TABLET | ORAL | Status: AC

## 2020-12-30 MED ORDER — FENTANYL CITRATE (PF) 100 MCG/2ML IJ SOLN
INTRAMUSCULAR | Status: AC
  Filled 2020-12-30: qty 2

## 2020-12-30 MED ORDER — VERAPAMIL HCL 2.5 MG/ML IV SOLN
INTRAVENOUS | Status: AC
  Filled 2020-12-30: qty 2

## 2020-12-30 MED ORDER — LIDOCAINE HCL (PF) 1 % IJ SOLN
INTRAMUSCULAR | Status: AC
  Filled 2020-12-30: qty 30

## 2020-12-30 MED ORDER — HYDRALAZINE HCL 20 MG/ML IJ SOLN: 10.0000 mg | INTRAMUSCULAR | Status: DC | PRN

## 2020-12-30 MED ORDER — ASPIRIN 81 MG PO CHEW: 81.0000 mg | CHEWABLE_TABLET | Freq: Every day | ORAL | Status: DC

## 2020-12-30 MED ORDER — MIDAZOLAM HCL 2 MG/2ML IJ SOLN
INTRAMUSCULAR | Status: AC
  Filled 2020-12-30: qty 2

## 2020-12-30 MED ORDER — HEPARIN SODIUM (PORCINE) 1000 UNIT/ML IJ SOLN
INTRAMUSCULAR | Status: DC | PRN
  Administered 2020-12-30: 3000 [IU] via INTRAVENOUS

## 2020-12-30 MED ORDER — SODIUM CHLORIDE 0.9 % IV SOLN: INTRAVENOUS | Status: DC

## 2020-12-30 MED ORDER — ONDANSETRON HCL 4 MG/2ML IJ SOLN: 4.0000 mg | Freq: Four times a day (QID) | INTRAMUSCULAR | Status: DC | PRN

## 2020-12-30 MED ORDER — SODIUM CHLORIDE 0.9% FLUSH: 3.0000 mL | Freq: Two times a day (BID) | INTRAVENOUS | Status: DC

## 2020-12-30 MED ORDER — VERAPAMIL HCL 2.5 MG/ML IV SOLN
INTRAVENOUS | Status: DC | PRN
  Administered 2020-12-30: 10 mL via INTRA_ARTERIAL
  Administered 2020-12-30: 5 mL via INTRA_ARTERIAL

## 2020-12-30 SURGICAL SUPPLY — 10 items
CATH INFINITI JR4 5F (CATHETERS) ×2 IMPLANT
CATH OPTITORQUE TIG 4.0 5F (CATHETERS) ×2 IMPLANT
DEVICE RAD TR BAND REGULAR (VASCULAR PRODUCTS) ×2 IMPLANT
GLIDESHEATH SLEND SS 6F .021 (SHEATH) ×2 IMPLANT
GUIDEWIRE INQWIRE 1.5J.035X260 (WIRE) IMPLANT
INQWIRE 1.5J .035X260CM (WIRE) ×3
KIT HEART LEFT (KITS) ×3 IMPLANT
PACK CARDIAC CATHETERIZATION (CUSTOM PROCEDURE TRAY) ×3 IMPLANT
TRANSDUCER W/STOPCOCK (MISCELLANEOUS) ×3 IMPLANT
TUBING CIL FLEX 10 FLL-RA (TUBING) ×3 IMPLANT

## 2020-12-30 NOTE — Interval H&P Note (Signed)
Cath Lab Visit (complete for each Cath Lab visit)  Clinical Evaluation Leading to the Procedure:   ACS: No.  Non-ACS:    Anginal Classification: CCS I  Anti-ischemic medical therapy: Minimal Therapy (1 class of medications)  Non-Invasive Test Results: High-risk stress test findings: cardiac mortality >3%/year  Prior CABG: No previous CABG      History and Physical Interval Note:  12/30/2020 10:26 AM  Helen Washington  has presented today for surgery, with the diagnosis of systolic heart failure.  The various methods of treatment have been discussed with the patient and family. After consideration of risks, benefits and other options for treatment, the patient has consented to  Procedure(s): LEFT HEART CATH AND CORONARY ANGIOGRAPHY (N/A) as a surgical intervention.  The patient's history has been reviewed, patient examined, no change in status, stable for surgery.  I have reviewed the patient's chart and labs.  Questions were answered to the patient's satisfaction.     Shelva Majestic

## 2020-12-31 ENCOUNTER — Encounter (HOSPITAL_COMMUNITY): Payer: Self-pay | Admitting: Cardiovascular Disease

## 2021-01-04 NOTE — Progress Notes (Signed)
Cardiology Office Note:    Date:  01/09/2021   ID:  Helen Washington, DOB 21-Sep-1945, MRN [id number]  PCP:  Prince Solian, MD  Cardiologist:  Donato Heinz, MD  Electrophysiologist:  None   Referring MD: Prince Solian, MD   Chief Complaint  Patient presents with   Coronary Artery Disease    History of Present Illness:    Helen Washington is a 75 y.o. female with a hx of hypertension, hyperlipidemia, PAD who presents for follow-up.  She was referred by Dr. Trula Slade for preop evaluation prior to bilateral femoral endarterectomy.  She was seen by Dr. Trula Slade for evaluation of claudication.  Angiogram on 05/15/2019 showed bilateral femoral artery occlusion, bilateral near occlusive popliteal stenosis.  Reports she has pain in her legs with minimal exertion, which limits her activity.  Up until 1 month ago she was doing water aerobics 3 times per week.  Would be in the pool for 3 hours.  She denies any exertional chest pain or dyspnea.  She quit smoking in 2002, smoked 1 pack/day x 45 years.  Denies any history of heart disease in her immediate family.  Coronary CTA on 06/30/2019 showed calcium score 1503 (97 percentile).  CT FFR showed severe stenosis in small nondominant circumflex artery (was read as occluded mid LAD and mid RCA on FFR but reviewed with Dr Meda Coffee and appears to not be occluded but rather not modeled on FFR due to small vessel size <1.71mm.  Lexiscan Myoview on 08/24/2019 showed large perfusion defect in basal to apical inferior/inferoseptal, minimal reversibility, EF 42%.  Echocardiogram on 08/29/2019 showed LVEF 45 to 77%, grade 1 diastolic dysfunction, RVSP 43 mmHg, moderate MR.  Echocardiogram 11/04/2020 showed EF 45 to 50%, normal RV function, moderate to severe MR, RVSP 41 mmHg.  Cardiac MRI on 12/16/2020 showed subendocardial LGE consistent with prior infarcts in basal to apical inferior, mid inferolateral, and apex with nonviable mid inferolateral, apical inferior, and apex;  EF 41%, normal RV, mild to moderate MR (regurgitant fraction 18%).  Cath on 12/30/2020 showed multivessel CAD (occluded mid RCA, 50% mid LAD, 70% OM, 20% proximal LCx), EF 45%, LVEDP 16 mmHg.  Medical therapy recommended.  Since last clinic visit, she reports that she has been doing well.  Denies any chest pain, dyspnea, lightheadedness, syncope, lower extremity edema, or palpitations.    BP Readings from Last 3 Encounters:  01/07/21 126/68  12/30/20 (!) 132/58  12/29/20 (!) 148/74      Past Medical History:  Diagnosis Date   Arthritis    Complication of anesthesia    woke up during colonoscopy   Hyperlipidemia    Hypertension    Insomnia    Sleep apnea     Past Surgical History:  Procedure Laterality Date   ABDOMINAL AORTOGRAM W/LOWER EXTREMITY Bilateral 05/15/2019   Procedure: ABDOMINAL AORTOGRAM W/LOWER EXTREMITY;  Surgeon: Serafina Mitchell, MD;  Location: [place] [facility];  Service: Cardiovascular;  Laterality: Bilateral;   ABDOMINAL AORTOGRAM W/LOWER EXTREMITY N/A 08/26/2020   Procedure: ABDOMINAL AORTOGRAM W/LOWER EXTREMITY;  Surgeon: Serafina Mitchell, MD;  Location: [place] [facility];  Service: Cardiovascular;  Laterality: N/A;   CATARACT EXTRACTION, BILATERAL  12/2015;01/2016   [facility]   COLONOSCOPY     ENDARTERECTOMY FEMORAL Bilateral 09/13/2019   Procedure: BILATERAL FEMORAL ENDARTERECTOMY with Patch Angioplasty;  Surgeon: Serafina Mitchell, MD;  Location: [facility];  Service: Vascular;  Laterality: Bilateral;   INSERTION OF ILIAC STENT Left 09/13/2019   Procedure: INSERTION  OF LEFT ILIAC STENT;  Surgeon: Serafina Mitchell, MD;  Location: [facility];  Service: Vascular;  Laterality: Left;   LEFT HEART CATH AND CORONARY ANGIOGRAPHY N/A 12/30/2020   Procedure: LEFT HEART CATH AND CORONARY ANGIOGRAPHY;  Surgeon: Troy Sine, MD;  Location: [place] [facility];  Service: Cardiovascular;  Laterality: N/A;   PATCH ANGIOPLASTY Bilateral 09/13/2019    Procedure: PATCH ANGIOPLASTY Bilateral;  Surgeon: Serafina Mitchell, MD;  Location: [facility];  Service: Vascular;  Laterality: Bilateral;   PERIPHERAL VASCULAR INTERVENTION Right 08/26/2020   Procedure: PERIPHERAL VASCULAR INTERVENTION;  Surgeon: Serafina Mitchell, MD;  Location: [place] [facility];  Service: Cardiovascular;  Laterality: Right;  femoral popliteal artery   TUBAL LIGATION  1969    Current Medications: Current Meds  Medication Sig   alendronate (FOSAMAX) 70 MG tablet Take 70 mg by mouth every Monday. Take with a full glass of water on an empty stomach.   aspirin EC 81 MG tablet Take 1 tablet (81 mg total) by mouth daily.   carvedilol (COREG) 3.125 MG tablet TAKE 1 TABLET (3.125 MG TOTAL) BY MOUTH 2 (TWO) TIMES DAILY.   Cholecalciferol (VITAMIN D3) 50 MCG (2000 UT) CAPS Take 2,000 Units by mouth daily.   clopidogrel (PLAVIX) 75 MG tablet Take 1 tablet (75 mg total) by mouth daily.   empagliflozin (JARDIANCE) 10 MG TABS tablet Take 1 tablet (10 mg total) by mouth daily before breakfast.   losartan (COZAAR) 25 MG tablet TAKE 1 TABLET BY MOUTH EVERY DAY   rosuvastatin (CRESTOR) 40 MG tablet Take 1 tablet (40 mg total) by mouth daily.     Allergies:   Codeine   Social History   Socioeconomic History   Marital status: Divorced    Spouse name: Not on file   Number of children: 1   Years of education: 12   Highest education level: Not on file  Occupational History    Comment: retired Scientist, clinical (histocompatibility and immunogenetics), Sport and exercise psychologist  Tobacco Use   Smoking status: Former    Packs/day: 1.00    Years: 36.00    Pack years: 36.00    Types: Cigarettes    Quit date: 04/01/2000    Years since quitting: 20.7   Smokeless tobacco: Never  Vaping Use   Vaping Use: Never used  Substance and Sexual Activity   Alcohol use: Yes    Comment: 1 glass wine per week   Drug use: No   Sexual activity: Not on file  Other Topics Concern   Not on file  Social History Narrative   Lives alone   Caffeine - coffee, 1 cup daily; maybe  4 teas per week   Right handed   Social Determinants of Health   Financial Resource Strain: Not on file  Food Insecurity: Not on file  Transportation Needs: Not on file  Physical Activity: Not on file  Stress: Not on file  Social Connections: Not on file     Family History: The patient's family history includes Anxiety disorder in her brother; Arthritis in her mother; Breast cancer in her cousin; CVA in her mother; Cancer in her father; Diverticulitis in her mother; Diverticulosis in her sister; Healthy in her daughter; Hypertension in her brother and sister; Kidney Stones in her brother; Stroke in her mother; Suicidality in her brother; Ulcers in her brother.  ROS:   Please see the history of present illness.      All other systems reviewed and are negative.  EKGs/Labs/Other Studies Reviewed:  The following studies were reviewed today:   EKG:   06/06/2019: sinus rhythm, PVC, nonspecific T wave flattening, less than 1 mm ST depression in V6 07/13/2019: EKG was not ordered. 08/07/2019: EKG was not ordered. 05/14/2020: Sinus rhythm. Rate: 55 bpm. Q waves in V1/V2. Less than 1 mm ST depression in leads I, II, V5 and V6.  11/14/20: Sinus rhythm, rate 62, Q waves in V1/2, T wave inversions in leads III, aVF, less than 1 mm ST depression in leads V5/6  Recent Labs: 12/26/2020: BUN 16; Creatinine, Ser 1.26; Hemoglobin 12.1; Platelets 198; Potassium 4.4; Sodium 139  Recent Lipid Panel    Component Value Date/Time   CHOL 172 11/14/2020 1019   TRIG 60 11/14/2020 1019   HDL 82 11/14/2020 1019   CHOLHDL 2.1 11/14/2020 1019   CHOLHDL 2.0 09/14/2019 0229   VLDL 13 09/14/2019 0229   LDLCALC 78 11/14/2020 1019    Physical Exam:    VS:  BP 126/68 (BP Location: Left Arm, Patient Position: Sitting, Cuff Size: Normal)    Pulse 60    Ht 4\' 11"  (1.499 m)    Wt 121 lb (54.9 kg)    SpO2 96%    BMI 24.44 kg/m     Wt Readings from Last 3 Encounters:  01/07/21 121 lb (54.9 kg)  12/30/20 120  lb (54.4 kg)  12/29/20 120 lb 12.8 oz (54.8 kg)     GEN:  in no acute distress HEENT: Normal NECK: No JVD; left carotid bruit CARDIAC: RRR, no murmurs, rubs, gallops RESPIRATORY:  Clear to auscultation without rales, wheezing or rhonchi  ABDOMEN: Soft, non-tender, non-distended MUSCULOSKELETAL:  No edema; No deformity  SKIN: Warm and dry NEUROLOGIC:  Alert and oriented x 3 PSYCHIATRIC:  Normal affect   ASSESSMENT:    1. Coronary artery disease involving native coronary artery of native heart without angina pectoris   2. Chronic systolic heart failure ([place])   3. Essential hypertension   4. Hyperlipidemia, unspecified hyperlipidemia type   5. Mitral valve insufficiency, unspecified etiology       PLAN:    Coronary artery disease:Coronary CTA on 06/30/2019 showed calcium score 1503 (97 percentile).  CT FFR showed severe stenosis in small nondominant circumflex artery (was read as occluded mid LAD and mid RCA on FFR but reviewed with Dr Meda Coffee and appears to not be occluded but rather not modeled on FFR due to small vessel size <1.27mm). Denies any anginal symptoms.  Lexiscan Myoview on 08/24/2019 showed large perfusion defect in basal to apical inferior/inferoseptal, minimal reversibility, EF 42%.  Echocardiogram on 08/29/2019 showed LVEF 45 to 92%, grade 1 diastolic dysfunction, RVSP 43 mmHg, moderate MR. Echocardiogram 11/04/2020 showed EF 45 to 50%, normal RV function, moderate to severe MR, RVSP 41 mmHg.  Cardiac MRI on 12/16/2020 showed subendocardial LGE consistent with prior infarcts in basal to apical inferior, mid inferolateral, and apex with nonviable mid inferolateral, apical inferior, and apex; EF 41%, normal RV, mild to moderate MR (regurgitant fraction 18%).  Cath on 12/30/2020 showed multivessel CAD (occluded mid RCA, 50% mid LAD, 70% OM, 20% proximal LCx), EF 45%, LVEDP 16 mmHg.  Medical therapy recommended.  She denies any anginal symptoms. -Continue aspirin 81 mg daily -On  rosuvastatin 20 mg daily LDL 78 on 11/14/2020, rosuvastatin increased to 40 mg daily.  We will repeat lipid panel  Chronic systolic heart failure: EF 45 to 50% on echocardiogram on 08/29/2019.  Stable at 45 to 50% on echo 11/04/2020.  EF 41% on CMR  12/16/2020.  Appears euvolemic. -Continue carvedilol 3.125 mg twice daily -Continue losartan 25 mg daily -Add Jardiance 10 mg daily -Check BMP in 1 week  Miltral regurgitation: Moderate on echocardiogram 08/29/2019.  Echocardiogram 11/04/2020 showed moderate to severe MR with eccentric posterior directed MR jet, could be due to restricted posterior leaflet, although some images suggested hockey-stick deformity of the anterior leaflet which could indicate possible rheumatic disease.   -No murmur on exam. Cardiac MRI shows mild to moderate MR (regurgitant fraction 18%)  PAD: Angiogram on 05/15/2019 showed bilateral femoral artery occlusion, bilateral near occlusive popliteal stenosis.  Status post bilateral femoral endarterectomy on 09/13/2019.  Underwent stent to right superficial femoral and popliteal artery on 08/26/2020.  Continue aspirin and rosuvastatin.  Hypertension: On carvedilol 3.25 mg twice daily and losartan 25 mg daily.  Appears controlled.  Hyperlipidemia: On rosuvastatin 20 mg daily LDL 78 on 11/14/2020, rosuvastatin increased to 40 mg daily.  We will recheck lipid panel  Left carotid bruit: Carotid duplex 05/01/2019 showed 1 to 39% ICA stenosis bilaterally  RTC in 3 months   Medication Adjustments/Labs and Tests Ordered: Current medicines are reviewed at length with the patient today.  Concerns regarding medicines are outlined above.  Orders Placed This Encounter  Procedures   Lipid panel   Basic metabolic panel   Magnesium     Meds ordered this encounter  Medications   empagliflozin (JARDIANCE) 10 MG TABS tablet    Sig: Take 1 tablet (10 mg total) by mouth daily before breakfast.    Dispense:  30 tablet    Refill:  3      Patient Instructions  Medication Instructions:  START: JARDIANCE 10mg  DAILY  *If you need a refill on your cardiac medications before your next appointment, please call your pharmacy*  Lab Work: PLEASE RETURN IN 1 [place]. LAB HOURS ARE 8am-4pm Monday-Friday  If you have labs (blood work) drawn today and your tests are completely normal, you will receive your results only by: [place] (if you have MyChart) OR A paper copy in the mail If you have any lab test that is abnormal or we need to change your treatment, we will call you to review the results.  Follow-Up: At [facility], you and your health needs are our priority.  As part of our continuing mission to provide you with exceptional heart care, we have created designated Provider Care Teams.  These Care Teams include your primary Cardiologist (physician) and Advanced Practice Providers (APPs -  Physician Assistants and Nurse Practitioners) who all work together to provide you with the care you need, when you need it.  Your next appointment:   3 month(s)  The format for your next appointment:   In Person  Provider:   Donato Heinz, MD        Signed, Donato Heinz, MD  01/09/2021 8:41 AM    [place]

## 2021-01-07 ENCOUNTER — Ambulatory Visit: Payer: Medicare HMO | Admitting: Cardiology

## 2021-01-07 ENCOUNTER — Encounter: Payer: Self-pay | Admitting: Cardiology

## 2021-01-07 ENCOUNTER — Other Ambulatory Visit: Payer: Self-pay

## 2021-01-07 VITALS — BP 126/68 | HR 60 | Ht 59.0 in | Wt 121.0 lb

## 2021-01-07 DIAGNOSIS — I5022 Chronic systolic (congestive) heart failure: Secondary | ICD-10-CM

## 2021-01-07 DIAGNOSIS — I2510 Atherosclerotic heart disease of native coronary artery without angina pectoris: Secondary | ICD-10-CM

## 2021-01-07 DIAGNOSIS — I10 Essential (primary) hypertension: Secondary | ICD-10-CM

## 2021-01-07 DIAGNOSIS — E785 Hyperlipidemia, unspecified: Secondary | ICD-10-CM

## 2021-01-07 DIAGNOSIS — I340 Nonrheumatic mitral (valve) insufficiency: Secondary | ICD-10-CM

## 2021-01-07 MED ORDER — EMPAGLIFLOZIN 10 MG PO TABS: 10.0000 mg | ORAL_TABLET | Freq: Every day | ORAL | 3 refills | Status: DC

## 2021-01-07 NOTE — Patient Instructions (Signed)
Medication Instructions:  START: JARDIANCE 10mg  DAILY  *If you need a refill on your cardiac medications before your next appointment, please call your pharmacy*  Lab Work: [place] [address] [place]. LAB HOURS ARE 8am-4pm Monday-Friday  If you have labs (blood work) drawn today and your tests are completely normal, you will receive your results only by: [place] (if you have MyChart) OR A paper copy in the mail If you have any lab test that is abnormal or we need to change your treatment, we will call you to review the results.  Follow-Up: At [facility], you and your health needs are our priority.  As part of our continuing mission to provide you with exceptional heart care, we have created designated Provider Care Teams.  These Care Teams include your primary Cardiologist (physician) and Advanced Practice Providers (APPs -  Physician Assistants and Nurse Practitioners) who all work together to provide you with the care you need, when you need it.  Your next appointment:   3 month(s)  The format for your next appointment:   In Person  Provider:   Donato Heinz, MD

## 2021-01-14 DIAGNOSIS — I10 Essential (primary) hypertension: Secondary | ICD-10-CM | POA: Diagnosis not present

## 2021-01-14 DIAGNOSIS — E785 Hyperlipidemia, unspecified: Secondary | ICD-10-CM | POA: Diagnosis not present

## 2021-01-14 LAB — BASIC METABOLIC PANEL
BUN/Creatinine Ratio: 17 (ref 12–28)
BUN: 23 mg/dL (ref 8–27)
CO2: 21 mmol/L (ref 20–29)
Calcium: 9.9 mg/dL (ref 8.7–10.3)
Chloride: 106 mmol/L (ref 96–106)
Creatinine, Ser: 1.36 mg/dL — ABNORMAL HIGH (ref 0.57–1.00)
Glucose: 90 mg/dL (ref 70–99)
Potassium: 4.9 mmol/L (ref 3.5–5.2)
Sodium: 141 mmol/L (ref 134–144)
eGFR: 41 mL/min/{1.73_m2} — ABNORMAL LOW (ref >59)

## 2021-01-14 LAB — LIPID PANEL
Chol/HDL Ratio: 2.1 ratio (ref 0.0–4.4)
Cholesterol, Total: 178 mg/dL (ref 100–199)
HDL: 86 mg/dL (ref >39)
LDL Chol Calc (NIH): 78 mg/dL (ref 0–99)
Triglycerides: 76 mg/dL (ref 0–149)
VLDL Cholesterol Cal: 14 mg/dL (ref 5–40)

## 2021-01-14 LAB — MAGNESIUM: Magnesium: 2.4 mg/dL — ABNORMAL HIGH (ref 1.6–2.3)

## 2021-01-15 ENCOUNTER — Other Ambulatory Visit: Payer: Self-pay

## 2021-01-15 MED ORDER — EZETIMIBE 10 MG PO TABS: 10.0000 mg | ORAL_TABLET | Freq: Every day | ORAL | 3 refills | Status: DC

## 2021-01-24 ENCOUNTER — Other Ambulatory Visit: Payer: Self-pay

## 2021-01-24 DIAGNOSIS — G4733 Obstructive sleep apnea (adult) (pediatric): Secondary | ICD-10-CM | POA: Diagnosis not present

## 2021-01-24 DIAGNOSIS — R0989 Other specified symptoms and signs involving the circulatory and respiratory systems: Secondary | ICD-10-CM

## 2021-02-02 ENCOUNTER — Encounter: Payer: Self-pay | Admitting: Cardiology

## 2021-02-03 MED ORDER — EZETIMIBE 10 MG PO TABS: 10.0000 mg | ORAL_TABLET | Freq: Every day | ORAL | 3 refills | Status: DC

## 2021-02-03 NOTE — Telephone Encounter (Signed)
Helen Washington, can we send her Zetia prescription to [facility]?  It is a mail order pharmacy for generic meds, cost is dramatically lower, would be $5 for 30 day supply of Zetia?

## 2021-02-03 NOTE — Addendum Note (Signed)
Addended by: Caprice Beaver T on: 02/03/2021 08:30 AM   Modules accepted: Orders

## 2021-02-03 NOTE — Telephone Encounter (Signed)
RX sent to pharmacy.  Patient advised via mychart.

## 2021-02-09 ENCOUNTER — Ambulatory Visit: Payer: Medicare HMO | Admitting: Physician Assistant

## 2021-02-09 ENCOUNTER — Other Ambulatory Visit: Payer: Self-pay

## 2021-02-09 ENCOUNTER — Ambulatory Visit (HOSPITAL_COMMUNITY)
Admission: RE | Admit: 2021-02-09 | Discharge: 2021-02-09 | Disposition: A | Discharge status: Home / Self Care | Payer: Medicare HMO | Source: Ambulatory Visit | Attending: Surgery | Admitting: Surgery

## 2021-02-09 VITALS — BP 116/70 | HR 79 | Temp 97.9°F | Resp 20 | Ht 59.0 in | Wt 117.0 lb

## 2021-02-09 DIAGNOSIS — R0989 Other specified symptoms and signs involving the circulatory and respiratory systems: Secondary | ICD-10-CM

## 2021-02-09 DIAGNOSIS — I6522 Occlusion and stenosis of left carotid artery: Secondary | ICD-10-CM

## 2021-02-09 DIAGNOSIS — I739 Peripheral vascular disease, unspecified: Secondary | ICD-10-CM | POA: Diagnosis not present

## 2021-02-09 NOTE — Progress Notes (Signed)
Office Note     CC:  follow up Requesting Provider:  Prince Solian, MD  HPI: Helen Washington is a 76 y.o. ([birth year]) female who presents for follow-up evaluation of left carotid artery bruit.  She denies any history of CVA or TIA.  She also denies any current strokelike symptoms including slurring speech, changes in vision, or one-sided weakness.  She is a former smoker.  She is on aspirin, statin, Plavix daily.  She is seen regularly by her cardiologist.  She has had an MI in the past based on imaging results however was not aware of this.  She denies any chest pain or shortness of breath with exertion.  She is also followed for PAD with history of bilateral common femoral endarterectomies in 2021.  Most recently she had right SFA/popliteal stenting by Dr. Trula Slade in August 2022.  She denies rest pain, nonhealing wounds of bilateral lower extremities.   Past Medical History:  Diagnosis Date   Arthritis    Complication of anesthesia    woke up during colonoscopy   Hyperlipidemia    Hypertension    Insomnia    Sleep apnea     Past Surgical History:  Procedure Laterality Date   ABDOMINAL AORTOGRAM W/LOWER EXTREMITY Bilateral 05/15/2019   Procedure: ABDOMINAL AORTOGRAM W/LOWER EXTREMITY;  Surgeon: Serafina Mitchell, MD;  Location: [place] [facility];  Service: Cardiovascular;  Laterality: Bilateral;   ABDOMINAL AORTOGRAM W/LOWER EXTREMITY N/A 08/26/2020   Procedure: ABDOMINAL AORTOGRAM W/LOWER EXTREMITY;  Surgeon: Serafina Mitchell, MD;  Location: [place] [facility];  Service: Cardiovascular;  Laterality: N/A;   CATARACT EXTRACTION, BILATERAL  12/2015;01/2016   [facility] [place] [facility]   COLONOSCOPY     ENDARTERECTOMY FEMORAL Bilateral 09/13/2019   Procedure: BILATERAL FEMORAL ENDARTERECTOMY with Patch Angioplasty;  Surgeon: Serafina Mitchell, MD;  Location: [place];  Service: Vascular;  Laterality: Bilateral;   INSERTION OF ILIAC STENT Left 09/13/2019   Procedure: INSERTION OF LEFT  ILIAC STENT;  Surgeon: Serafina Mitchell, MD;  Location: [place];  Service: Vascular;  Laterality: Left;   LEFT HEART CATH AND CORONARY ANGIOGRAPHY N/A 12/30/2020   Procedure: LEFT HEART CATH AND CORONARY ANGIOGRAPHY;  Surgeon: Troy Sine, MD;  Location: [place] [facility];  Service: Cardiovascular;  Laterality: N/A;   PATCH ANGIOPLASTY Bilateral 09/13/2019   Procedure: PATCH ANGIOPLASTY Bilateral;  Surgeon: Serafina Mitchell, MD;  Location: [facility];  Service: Vascular;  Laterality: Bilateral;   PERIPHERAL VASCULAR INTERVENTION Right 08/26/2020   Procedure: PERIPHERAL VASCULAR INTERVENTION;  Surgeon: Serafina Mitchell, MD;  Location: [place] [facility];  Service: Cardiovascular;  Laterality: Right;  femoral popliteal artery   TUBAL LIGATION  1969    Social History   Socioeconomic History   Marital status: Divorced    Spouse name: Not on file   Number of children: 1   Years of education: 12   Highest education level: Not on file  Occupational History    Comment: retired Scientist, clinical (histocompatibility and immunogenetics), Sport and exercise psychologist  Tobacco Use   Smoking status: Former    Packs/day: 1.00    Years: 36.00    Pack years: 36.00    Types: Cigarettes    Quit date: 04/01/2000    Years since quitting: 20.8   Smokeless tobacco: Never  Vaping Use   Vaping Use: Never used  Substance and Sexual Activity   Alcohol use: Yes    Comment: 1 glass wine per week   Drug use: No   Sexual activity: Not on file  Other Topics Concern   Not on file  Social History Narrative   Lives alone   Caffeine - coffee, 1 cup daily; maybe 4 teas per week   Right handed   Social Determinants of Health   Financial Resource Strain: Not on file  Food Insecurity: Not on file  Transportation Needs: Not on file  Physical Activity: Not on file  Stress: Not on file  Social Connections: Not on file  Intimate Partner Violence: Not on file    Family History  Problem Relation Age of Onset   Stroke Mother    Arthritis Mother    CVA Mother    Diverticulitis Mother     Cancer Father    Hypertension Sister    Diverticulosis Sister    Breast cancer Cousin    Suicidality Brother    Hypertension Brother    Anxiety disorder Brother    Kidney Stones Brother    Ulcers Brother    Healthy Daughter     Current Outpatient Medications  Medication Sig Dispense Refill   alendronate (FOSAMAX) 70 MG tablet Take 70 mg by mouth every Monday. Take with a full glass of water on an empty stomach.     aspirin EC 81 MG tablet Take 1 tablet (81 mg total) by mouth daily. 90 tablet 3   Cholecalciferol (VITAMIN D3) 50 MCG (2000 UT) CAPS Take 2,000 Units by mouth daily.     clopidogrel (PLAVIX) 75 MG tablet Take 1 tablet (75 mg total) by mouth daily. 30 tablet 11   empagliflozin (JARDIANCE) 10 MG TABS tablet Take 1 tablet (10 mg total) by mouth daily before breakfast. 30 tablet 3   ezetimibe (ZETIA) 10 MG tablet Take 1 tablet (10 mg total) by mouth daily. 90 tablet 3   losartan (COZAAR) 25 MG tablet TAKE 1 TABLET BY MOUTH EVERY DAY 90 tablet 1   rosuvastatin (CRESTOR) 40 MG tablet Take 1 tablet (40 mg total) by mouth daily. 90 tablet 3   Aspirin-Caffeine (BC FAST PAIN RELIEF PO) Take 1 packet by mouth daily as needed (headaches). (Patient not taking: Reported on 01/07/2021)     carvedilol (COREG) 3.125 MG tablet TAKE 1 TABLET (3.125 MG TOTAL) BY MOUTH 2 (TWO) TIMES DAILY. 180 tablet 3   No current facility-administered medications for this visit.    Allergies  Allergen Reactions   Codeine Nausea Only     REVIEW OF SYSTEMS:   [X]  denotes positive finding, [ ]  denotes negative finding Cardiac  Comments:  Chest pain or chest pressure:    Shortness of breath upon exertion:    Short of breath when lying flat:    Irregular heart rhythm:        Vascular    Pain in calf, thigh, or hip brought on by ambulation:    Pain in feet at night that wakes you up from your sleep:     Blood clot in your veins:    Leg swelling:         Pulmonary    Oxygen at home:     Productive cough:     Wheezing:         Neurologic    Sudden weakness in arms or legs:     Sudden numbness in arms or legs:     Sudden onset of difficulty speaking or slurred speech:    Temporary loss of vision in one eye:     Problems with dizziness:         Gastrointestinal  Blood in stool:     Vomited blood:         Genitourinary    Burning when urinating:     Blood in urine:        Psychiatric    Major depression:         Hematologic    Bleeding problems:    Problems with blood clotting too easily:        Skin    Rashes or ulcers:        Constitutional    Fever or chills:      PHYSICAL EXAMINATION:  Vitals:   02/09/21 0921 02/09/21 0924  BP: 122/61 116/70  Pulse: 79   Resp: 20   Temp: 97.9 F (36.6 C)   SpO2: 97%   Weight: 117 lb (53.1 kg)   Height: 4\' 11"  (1.499 m)     General:  WDWN in NAD; vital signs documented above Gait: Not observed HENT: WNL, normocephalic Pulmonary: normal non-labored breathing , without Rales, rhonchi,  wheezing Cardiac: regular HR Abdomen: soft, NT, no masses Skin: without rashes Extremities: without ischemic changes, without Gangrene , without cellulitis; without open wounds;  Musculoskeletal: no muscle wasting or atrophy  Neurologic: A&O X 3;  No focal weakness or paresthesias are detected Psychiatric:  The pt has Normal affect.   Non-Invasive Vascular Imaging:   Right ICA 1 to 39% stenosis Left ICA 80 to 99% stenosis   ASSESSMENT/PLAN:: 76 y.o. female here for follow up for carotid artery duplex after left carotid bruit was heard on exam  -Patient denies any neurologic events since last office visit and neuro exam is unremarkable today -Carotid duplex demonstrates critical stenosis of left ICA; asymptomatic -Plan will be for CTA head and neck and follow-up with Dr. Trula Slade in 2 weeks for surgical planning -She knows to seek medical attention if she develops strokelike symptoms   Dagoberto Ligas,  PA-C [facility] [phone]  Clinic MD:   Trula Slade

## 2021-02-10 ENCOUNTER — Other Ambulatory Visit (HOSPITAL_COMMUNITY): Payer: Self-pay | Admitting: Surgery

## 2021-02-10 DIAGNOSIS — I6522 Occlusion and stenosis of left carotid artery: Secondary | ICD-10-CM

## 2021-02-10 DIAGNOSIS — R0989 Other specified symptoms and signs involving the circulatory and respiratory systems: Secondary | ICD-10-CM

## 2021-02-11 ENCOUNTER — Other Ambulatory Visit: Payer: Self-pay

## 2021-02-11 ENCOUNTER — Other Ambulatory Visit (HOSPITAL_COMMUNITY): Payer: Self-pay | Admitting: Vascular Surgery

## 2021-02-11 DIAGNOSIS — R0989 Other specified symptoms and signs involving the circulatory and respiratory systems: Secondary | ICD-10-CM

## 2021-02-11 DIAGNOSIS — I6522 Occlusion and stenosis of left carotid artery: Secondary | ICD-10-CM

## 2021-02-11 LAB — BUN+CREAT
BUN/Creatinine Ratio: 10 — ABNORMAL LOW (ref 12–28)
BUN: 18 mg/dL (ref 8–27)
Creatinine, Ser: 1.75 mg/dL — ABNORMAL HIGH (ref 0.57–1.00)
eGFR: 30 mL/min/{1.73_m2} — ABNORMAL LOW (ref >59)

## 2021-02-12 ENCOUNTER — Other Ambulatory Visit: Payer: Self-pay

## 2021-02-12 DIAGNOSIS — R0989 Other specified symptoms and signs involving the circulatory and respiratory systems: Secondary | ICD-10-CM

## 2021-02-12 DIAGNOSIS — I6522 Occlusion and stenosis of left carotid artery: Secondary | ICD-10-CM

## 2021-02-13 ENCOUNTER — Other Ambulatory Visit: Payer: Self-pay

## 2021-02-13 ENCOUNTER — Ambulatory Visit (HOSPITAL_BASED_OUTPATIENT_CLINIC_OR_DEPARTMENT_OTHER)
Admission: RE | Admit: 2021-02-13 | Discharge: 2021-02-13 | Disposition: A | Discharge status: Home / Self Care | Payer: Medicare HMO | Source: Ambulatory Visit | Attending: Surgery | Admitting: Surgery

## 2021-02-13 DIAGNOSIS — I6523 Occlusion and stenosis of bilateral carotid arteries: Secondary | ICD-10-CM | POA: Diagnosis not present

## 2021-02-13 DIAGNOSIS — I6503 Occlusion and stenosis of bilateral vertebral arteries: Secondary | ICD-10-CM | POA: Diagnosis not present

## 2021-02-13 DIAGNOSIS — R0989 Other specified symptoms and signs involving the circulatory and respiratory systems: Secondary | ICD-10-CM | POA: Insufficient documentation

## 2021-02-13 DIAGNOSIS — I6522 Occlusion and stenosis of left carotid artery: Secondary | ICD-10-CM | POA: Insufficient documentation

## 2021-02-13 IMAGING — CT CT ANGIO NECK
3 of 8 series · 10 of 36 positions shown · IV contrast (APPLIED)
Comparison: None.

CLINICAL DATA: History of abnormal Doppler



[Series 6: cta neck · axial · 0.33mm/px · z∈[+1052,+1236]mm · 6 of 521 slices shown]
[im 75/521  soft-tissue]
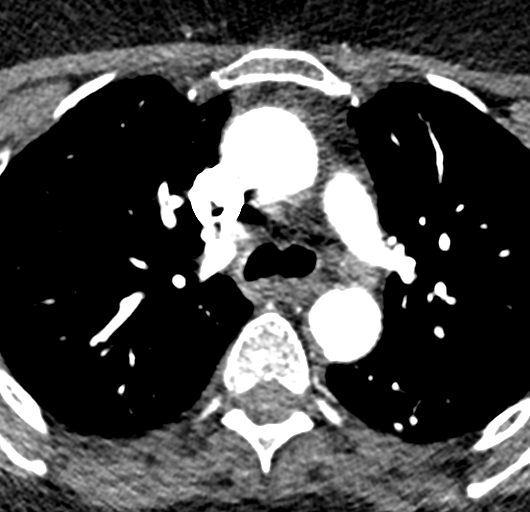
[im 149/521  bone]
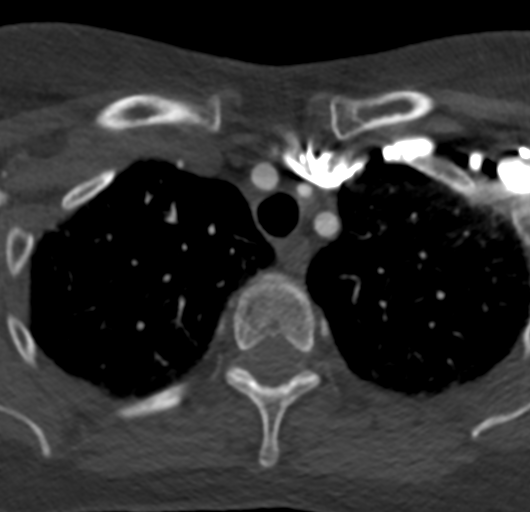
[im 223/521  soft-tissue]
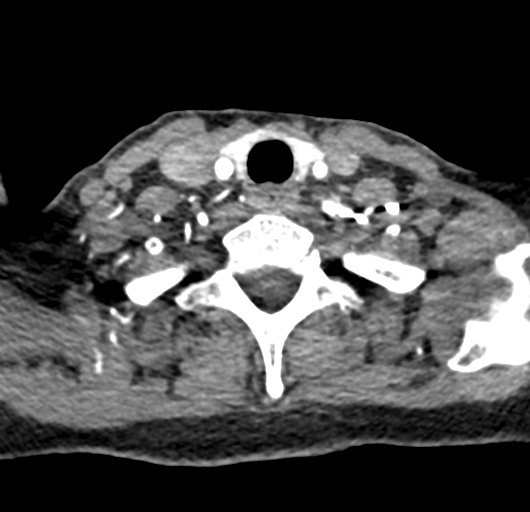
[im 298/521  bone]
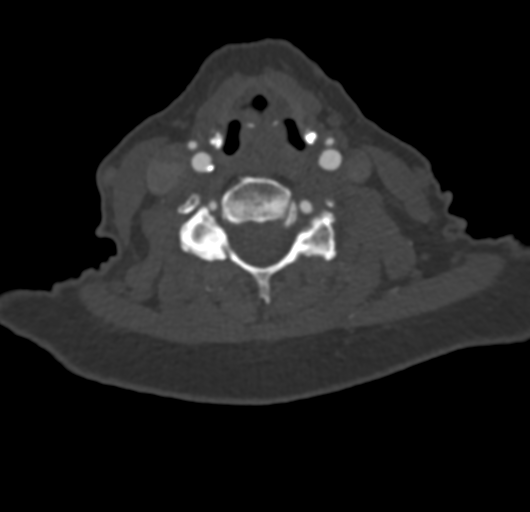
[im 372/521  soft-tissue]
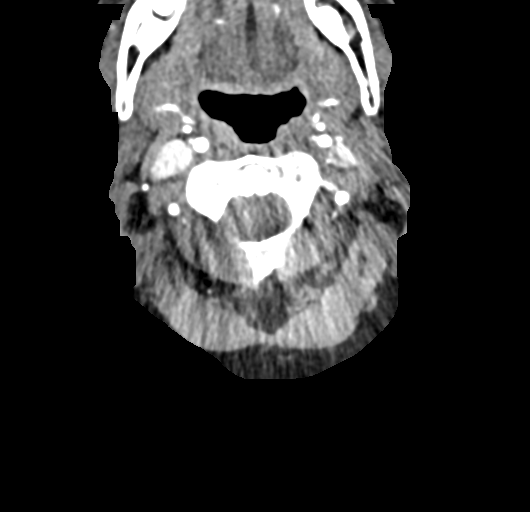
[im 446/521  bone]
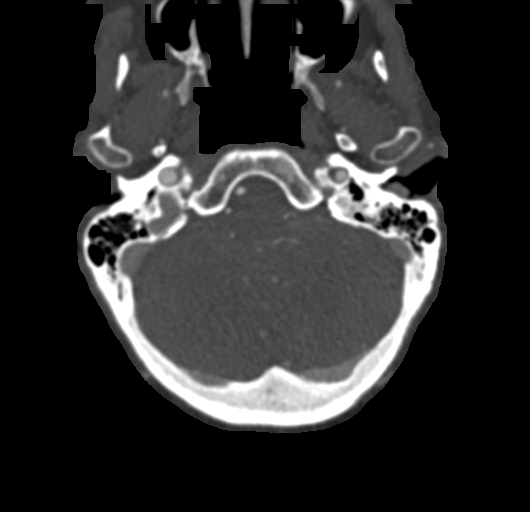

[Series 7: ax thin · axial · 0.33mm/px · z∈[+1102,+1188]mm · 2 of 262 slices shown]
[im 88/262  soft-tissue]
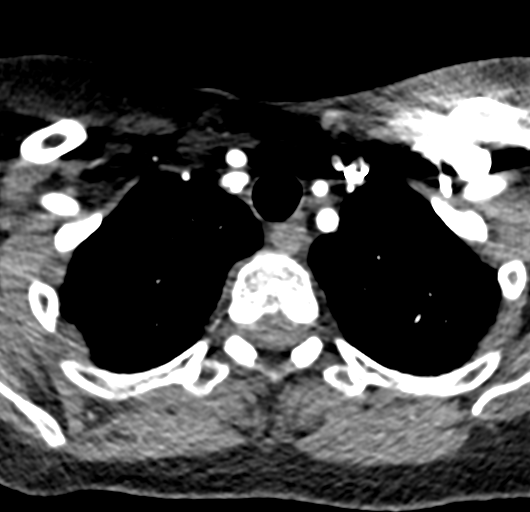
[im 175/262  soft-tissue]
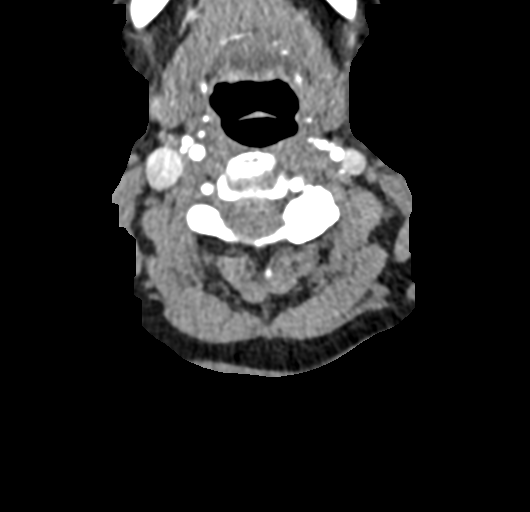

[Series 9: sag thin · sagittal · 0.50mm/px · 2 of 211 slices shown]
[im 52/211  soft-tissue]
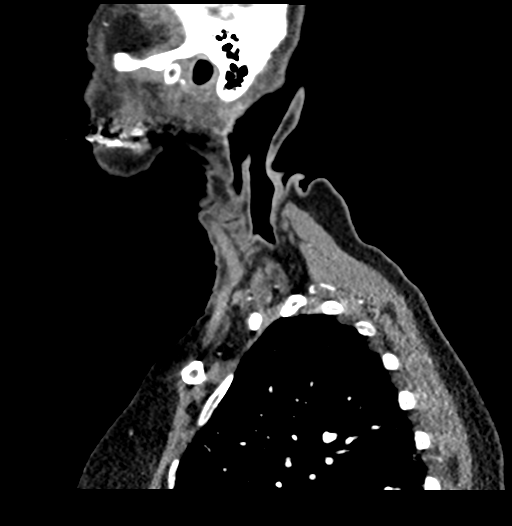
[im 160/211  soft-tissue]
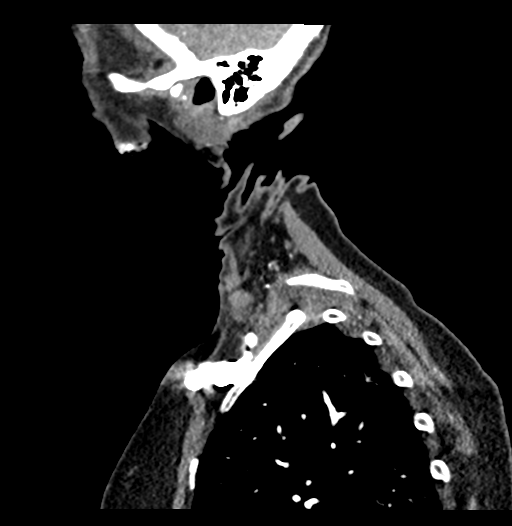

[10 of 36 positions shown; findings below may reference images not displayed]

RADIATION DOSE REDUCTION: This exam was performed according to the
departmental dose-optimization program which includes automated
exposure control, adjustment of the mA and/or kV according to
patient size and/or use of iterative reconstruction technique.

CONTRAST:  100mL OMNIPAQUE IOHEXOL 350 MG/ML SOLN
FINDINGS: Aortic arch: Atheromatous plaque with 3 vessel branching.

Right carotid system: Posterior proximal ICA calcified plaque with
mild wasting at the ICA origin. No significant stenosis and no
beading.

Left carotid system: Calcified plaque at the origin and mid common
carotid without flow limiting stenosis. Complex mixed density plaque
at the bifurcation with stenosis that is difficult to quantify given
the degree of narrowing. This stenosis affects both the ICA and ECA.
The lumen measures less than 1 mm, severe stenosis with some degree
of probable downstream underfilling, although circle-of-Willis
anatomy is incompletely covered.

Vertebral arteries: Atheromatous calcification at the subclavian
origins without flow limiting stenosis. The left vertebral artery is
dominant. No vertebral stenosis or beading.

Skeleton: Generalized cervical spine degeneration with listhesis at
C4-5 to C6-7.

Other neck: No acute finding.

Upper chest: Mosaic attenuation of the upper lungs, usually from
small airways disease.
IMPRESSION: Severe atheromatous stenosis at the left carotid bifurcation to the
degree that the lumen is difficult to even measure. The stenotic
lumen certainly measures less than 1 mm.

## 2021-02-13 MED ORDER — IOHEXOL 350 MG/ML SOLN
100.0000 mL | Freq: Once | INTRAVENOUS | Status: AC | PRN
  Administered 2021-02-13: 100 mL via INTRAVENOUS

## 2021-02-23 ENCOUNTER — Encounter: Payer: Self-pay | Admitting: Surgery

## 2021-02-23 ENCOUNTER — Other Ambulatory Visit: Payer: Self-pay

## 2021-02-23 ENCOUNTER — Telehealth: Payer: Self-pay | Admitting: Cardiology

## 2021-02-23 ENCOUNTER — Ambulatory Visit: Payer: Medicare HMO | Admitting: Surgery

## 2021-02-23 VITALS — BP 116/70 | HR 69 | Temp 98.0°F | Resp 14 | Ht 59.0 in | Wt 115.0 lb

## 2021-02-23 DIAGNOSIS — I70213 Atherosclerosis of native arteries of extremities with intermittent claudication, bilateral legs: Secondary | ICD-10-CM | POA: Diagnosis not present

## 2021-02-23 DIAGNOSIS — I6523 Occlusion and stenosis of bilateral carotid arteries: Secondary | ICD-10-CM

## 2021-02-23 NOTE — Telephone Encounter (Signed)
° ° °  Patient Name: Helen Washington  DOB: 10/09/1945 MRN: [id number]  Primary Cardiologist: Donato Heinz, MD  Chart reviewed as part of pre-operative protocol coverage. Patient has a significant history of CAD and PAD. Recent cardiac catheterization in 12/2020 showed severe multivessel CAD involving all 3 major coronary arteries with 99% stenosis of proximal RCA followed by 100% stenosis of proximal to mid RCA and then 99% stenosis of mid to distal RCA (with extensive left to right collaterals from the LAD and septal perforating arteries) as well as 70% stenosis of OM2, and 50% stenosis of mid LAD. Medical therapy was recommended at that time. Cardiac MRI in 11/2020 showed subendocardial LGE consistent with prior infarct. Patient was last seen by Dr. Gardiner Rhyme in 12/2020 at which time she was doing well from a cardiac standpoint. She has now been found to have high grade stenosis of left ICA and plan is for left CEA ASAP. Patient was contacted today for additional pre-op evaluation and reported doing well since last visit. She reports very brief and atypical chest sensation when she yawns but no prolonged or exertion chest pain. Nothing concerning for angina. No new or worsening dyspnea. No acute CHF symptoms. No palpitations or syncope. I personally discussed patient with Dr. Gardiner Rhyme - patient is at a higher risk but not prohibitive risk. She is well optimized from a cardiac standpoint and no further cardiovascular testing is necessary prior to planned procedure. I did discuss this with patient and she understands she is at a higher risk.  I will route this recommendation to the requesting party via Epic fax function and remove from pre-op pool.  Please call with questions.  Darreld Mclean, PA-C 02/23/2021, 4:28 PM

## 2021-02-23 NOTE — Progress Notes (Signed)
Vascular and Vein Specialist of [facility]  Patient name: Helen Washington MRN: [id number] DOB: 03/24/1945 Sex: female   REASON FOR VISIT:    Follow up  [name]:    Helen Washington is a 76 y.o. female who suffers from bilateral claudication.  On 09/13/2019, she underwent bilateral femoral endarterectomy with patch angioplasty using a bovine patch.  Simultaneously she had a left external iliac stent placed.  She had recurrence of her symptoms.  She subsequently underwent stenting of her right superficial femoral artery on 08/26/2020 for a 90% right superficial femoral artery lesion and a 90% popliteal lesion.  She has a short segment left popliteal occlusion which has not been addressed.  At her most recent visit, she was found to have greater than 80% left carotid stenosis which was asymptomatic.  She was sent for CT scan for further evaluation.  The patient suffers from white coat hypertension.  she takes a statin for hypercholesterolemia.  The patient is a former smoker PAST MEDICAL HISTORY:   Past Medical History:  Diagnosis Date   Arthritis    Complication of anesthesia    woke up during colonoscopy   Hyperlipidemia    Hypertension    Insomnia    Sleep apnea      FAMILY HISTORY:   Family History  Problem Relation Age of Onset   Stroke Mother    Arthritis Mother    CVA Mother    Diverticulitis Mother    Cancer Father    Hypertension Sister    Diverticulosis Sister    Breast cancer Cousin    Suicidality Brother    Hypertension Brother    Anxiety disorder Brother    Kidney Stones Brother    Ulcers Brother    Healthy Daughter     SOCIAL HISTORY:   Social History   Tobacco Use   Smoking status: Former    Packs/day: 1.00    Years: 36.00    Pack years: 36.00    Types: Cigarettes    Quit date: 04/01/2000    Years since quitting: 20.9   Smokeless tobacco: Never  Substance Use Topics   Alcohol use: Yes    Comment:  1 glass wine per week     ALLERGIES:   Allergies  Allergen Reactions   Codeine Nausea Only     CURRENT MEDICATIONS:   Current Outpatient Medications  Medication Sig Dispense Refill   alendronate (FOSAMAX) 70 MG tablet Take 70 mg by mouth every Monday. Take with a full glass of water on an empty stomach.     aspirin EC 81 MG tablet Take 1 tablet (81 mg total) by mouth daily. 90 tablet 3   Cholecalciferol (VITAMIN D3) 50 MCG (2000 UT) CAPS Take 2,000 Units by mouth daily.     clopidogrel (PLAVIX) 75 MG tablet Take 1 tablet (75 mg total) by mouth daily. 30 tablet 11   empagliflozin (JARDIANCE) 10 MG TABS tablet Take 1 tablet (10 mg total) by mouth daily before breakfast. 30 tablet 3   ezetimibe (ZETIA) 10 MG tablet Take 1 tablet (10 mg total) by mouth daily. 90 tablet 3   losartan (COZAAR) 25 MG tablet TAKE 1 TABLET BY MOUTH EVERY DAY 90 tablet 1   rosuvastatin (CRESTOR) 40 MG tablet Take 1 tablet (40 mg total) by mouth daily. 90 tablet 3   Aspirin-Caffeine (BC FAST PAIN RELIEF PO) Take 1 packet by mouth daily as needed (headaches). (Patient not taking: Reported on 01/07/2021)  carvedilol (COREG) 3.125 MG tablet TAKE 1 TABLET (3.125 MG TOTAL) BY MOUTH 2 (TWO) TIMES DAILY. 180 tablet 3   No current facility-administered medications for this visit.    REVIEW OF SYSTEMS:   [X]  denotes positive finding, [ ]  denotes negative finding Cardiac  Comments:  Chest pain or chest pressure:    Shortness of breath upon exertion:    Short of breath when lying flat:    Irregular heart rhythm:        Vascular    Pain in calf, thigh, or hip brought on by ambulation: x   Pain in feet at night that wakes you up from your sleep:     Blood clot in your veins:    Leg swelling:         Pulmonary    Oxygen at home:    Productive cough:     Wheezing:         Neurologic    Sudden weakness in arms or legs:     Sudden numbness in arms or legs:     Sudden onset of difficulty speaking or slurred  speech:    Temporary loss of vision in one eye:     Problems with dizziness:         Gastrointestinal    Blood in stool:     Vomited blood:         Genitourinary    Burning when urinating:     Blood in urine:        Psychiatric    Major depression:         Hematologic    Bleeding problems:    Problems with blood clotting too easily:        Skin    Rashes or ulcers:        Constitutional    Fever or chills:      PHYSICAL EXAM:   Vitals:   02/23/21 1447 02/23/21 1449  BP: 119/73 116/70  Pulse: 69 69  Resp: 14   Temp: 98 F (36.7 C)   TempSrc: Temporal   SpO2: 95%   Weight: 115 lb (52.2 kg)   Height: 4\' 11"  (1.499 m)     GENERAL: The patient is a well-nourished female, in no acute distress. The vital signs are documented above. CARDIAC: There is a regular rate and rhythm.  PULMONARY: Non-labored respirations MUSCULOSKELETAL: There are no major deformities or cyanosis. NEUROLOGIC: No focal weakness or paresthesias are detected. SKIN: There are no ulcers or rashes noted. PSYCHIATRIC: The patient has a normal affect.  STUDIES:   I have reviewed her CT scan with the following findings: Severe atheromatous stenosis at the left carotid bifurcation to the degree that the lumen is difficult to even measure. The stenotic lumen certainly measures less than 1 mm.  MEDICAL ISSUES:   PAD: The patient is having progressively worsening symptoms of her left leg.  She has a known popliteal short segment occlusion.  We discussed intervening on this after she is recovered from her carotid surgery.  Hopefully this is something that I can cross and treat with stenting.  Carotid: The patient has a subtotal occlusion of her left carotid.  This is heavily calcified.  I think she would best be treated with carotid endarterectomy.  I discussed the details of the procedure including the risk of stroke and nerve injury.  She wishes to proceed.  I am going to get cardiology to see her  again for preoperative clearance.  Because of  the degree of stenosis within her artery, I will not stop her aspirin or Plavix.  I like to get this scheduled as soon as possible.    Leia Alf, MD, FACS [facility] of [facility] [phone] Pager [phone]

## 2021-02-23 NOTE — H&P (View-Only) (Signed)
Vascular and Vein Specialist of [facility]  Patient name: Helen Washington MRN: [id number] DOB: 03-14-1945 Sex: female   REASON FOR VISIT:    Follow up  [place]:    Helen Washington is a 76 y.o. female who suffers from bilateral claudication.  On 09/13/2019, she underwent bilateral femoral endarterectomy with patch angioplasty using a bovine patch.  Simultaneously she had a left external iliac stent placed.  She had recurrence of her symptoms.  She subsequently underwent stenting of her right superficial femoral artery on 08/26/2020 for a 90% right superficial femoral artery lesion and a 90% popliteal lesion.  She has a short segment left popliteal occlusion which has not been addressed.  At her most recent visit, she was found to have greater than 80% left carotid stenosis which was asymptomatic.  She was sent for CT scan for further evaluation.  The patient suffers from white coat hypertension.  she takes a statin for hypercholesterolemia.  The patient is a former smoker PAST MEDICAL HISTORY:   Past Medical History:  Diagnosis Date   Arthritis    Complication of anesthesia    woke up during colonoscopy   Hyperlipidemia    Hypertension    Insomnia    Sleep apnea      FAMILY HISTORY:   Family History  Problem Relation Age of Onset   Stroke Mother    Arthritis Mother    CVA Mother    Diverticulitis Mother    Cancer Father    Hypertension Sister    Diverticulosis Sister    Breast cancer Cousin    Suicidality Brother    Hypertension Brother    Anxiety disorder Brother    Kidney Stones Brother    Ulcers Brother    Healthy Daughter     SOCIAL HISTORY:   Social History   Tobacco Use   Smoking status: Former    Packs/day: 1.00    Years: 36.00    Pack years: 36.00    Types: Cigarettes    Quit date: 04/01/2000    Years since quitting: 20.9   Smokeless tobacco: Never  Substance Use Topics   Alcohol use: Yes    Comment:  1 glass wine per week     ALLERGIES:   Allergies  Allergen Reactions   Codeine Nausea Only     CURRENT MEDICATIONS:   Current Outpatient Medications  Medication Sig Dispense Refill   alendronate (FOSAMAX) 70 MG tablet Take 70 mg by mouth every Monday. Take with a full glass of water on an empty stomach.     aspirin EC 81 MG tablet Take 1 tablet (81 mg total) by mouth daily. 90 tablet 3   Cholecalciferol (VITAMIN D3) 50 MCG (2000 UT) CAPS Take 2,000 Units by mouth daily.     clopidogrel (PLAVIX) 75 MG tablet Take 1 tablet (75 mg total) by mouth daily. 30 tablet 11   empagliflozin (JARDIANCE) 10 MG TABS tablet Take 1 tablet (10 mg total) by mouth daily before breakfast. 30 tablet 3   ezetimibe (ZETIA) 10 MG tablet Take 1 tablet (10 mg total) by mouth daily. 90 tablet 3   losartan (COZAAR) 25 MG tablet TAKE 1 TABLET BY MOUTH EVERY DAY 90 tablet 1   rosuvastatin (CRESTOR) 40 MG tablet Take 1 tablet (40 mg total) by mouth daily. 90 tablet 3   Aspirin-Caffeine (BC FAST PAIN RELIEF PO) Take 1 packet by mouth daily as needed (headaches). (Patient not taking: Reported on 01/07/2021)  carvedilol (COREG) 3.125 MG tablet TAKE 1 TABLET (3.125 MG TOTAL) BY MOUTH 2 (TWO) TIMES DAILY. 180 tablet 3   No current facility-administered medications for this visit.    REVIEW OF SYSTEMS:   [X]  denotes positive finding, [ ]  denotes negative finding Cardiac  Comments:  Chest pain or chest pressure:    Shortness of breath upon exertion:    Short of breath when lying flat:    Irregular heart rhythm:        Vascular    Pain in calf, thigh, or hip brought on by ambulation: x   Pain in feet at night that wakes you up from your sleep:     Blood clot in your veins:    Leg swelling:         Pulmonary    Oxygen at home:    Productive cough:     Wheezing:         Neurologic    Sudden weakness in arms or legs:     Sudden numbness in arms or legs:     Sudden onset of difficulty speaking or slurred  speech:    Temporary loss of vision in one eye:     Problems with dizziness:         Gastrointestinal    Blood in stool:     Vomited blood:         Genitourinary    Burning when urinating:     Blood in urine:        Psychiatric    Major depression:         Hematologic    Bleeding problems:    Problems with blood clotting too easily:        Skin    Rashes or ulcers:        Constitutional    Fever or chills:      PHYSICAL EXAM:   Vitals:   02/23/21 1447 02/23/21 1449  BP: 119/73 116/70  Pulse: 69 69  Resp: 14   Temp: 98 F (36.7 C)   TempSrc: Temporal   SpO2: 95%   Weight: 115 lb (52.2 kg)   Height: 4\' 11"  (1.499 m)     GENERAL: The patient is a well-nourished female, in no acute distress. The vital signs are documented above. CARDIAC: There is a regular rate and rhythm.  PULMONARY: Non-labored respirations MUSCULOSKELETAL: There are no major deformities or cyanosis. NEUROLOGIC: No focal weakness or paresthesias are detected. SKIN: There are no ulcers or rashes noted. PSYCHIATRIC: The patient has a normal affect.  STUDIES:   I have reviewed her CT scan with the following findings: Severe atheromatous stenosis at the left carotid bifurcation to the degree that the lumen is difficult to even measure. The stenotic lumen certainly measures less than 1 mm.  MEDICAL ISSUES:   PAD: The patient is having progressively worsening symptoms of her left leg.  She has a known popliteal short segment occlusion.  We discussed intervening on this after she is recovered from her carotid surgery.  Hopefully this is something that I can cross and treat with stenting.  Carotid: The patient has a subtotal occlusion of her left carotid.  This is heavily calcified.  I think she would best be treated with carotid endarterectomy.  I discussed the details of the procedure including the risk of stroke and nerve injury.  She wishes to proceed.  I am going to get cardiology to see her  again for preoperative clearance.  Because of  the degree of stenosis within her artery, I will not stop her aspirin or Plavix.  I like to get this scheduled as soon as possible.    Leia Alf, MD, FACS [facility]ists of [facility] [phone] Pager [phone]

## 2021-02-23 NOTE — Telephone Encounter (Signed)
° °  Pre-operative Risk Assessment    Patient Name: Helen Washington  DOB: 09-27-1945 MRN: [id number]      Request for Surgical Clearance    Procedure:  Left CEA  Date of Surgery:  Clearance                Pending need asap                   Surgeon:  Dr Trula Slade Surgeon's Group or Practice Name:   Phone number:  [phone] Fax number:  [phone]   Type of Clearance Requested:   Medical    Type of Anesthesia:  Not Indicated   Additional requests/questions:    Signed, Glyn Ade   02/23/2021, 3:29 PM

## 2021-02-24 ENCOUNTER — Encounter: Payer: Self-pay | Admitting: Surgery

## 2021-02-24 DIAGNOSIS — G4733 Obstructive sleep apnea (adult) (pediatric): Secondary | ICD-10-CM | POA: Diagnosis not present

## 2021-02-25 ENCOUNTER — Other Ambulatory Visit: Payer: Self-pay

## 2021-02-25 DIAGNOSIS — I6523 Occlusion and stenosis of bilateral carotid arteries: Secondary | ICD-10-CM

## 2021-02-27 ENCOUNTER — Other Ambulatory Visit: Payer: Medicare HMO

## 2021-03-09 NOTE — Pre-Procedure Instructions (Signed)
Surgical Instructions    Your procedure is scheduled on Friday, February 24th.  Report to [facility] Main Entrance "A" at 5:30 A.M., then check in with the Admitting office.  Call this number if you have problems the morning of surgery:  [phone]   If you have any questions prior to your surgery date call [phone]: Open Monday-Friday 8am-4pm    Remember:  Do not eat or drink after midnight the night before your surgery   Take these medicines the morning of surgery with A SIP OF WATER  aspirin EC carvedilol (COREG) clopidogrel (PLAVIX)  ezetimibe (ZETIA) rosuvastatin (CRESTOR)   DO NOT TAKE empagliflozin (JARDIANCE) on Thursday (2/23) and Friday (2/24).  As of today, STOP taking any Aleve, Naproxen, Ibuprofen, Motrin, Advil, Goody's, BC's, all herbal medications, fish oil, and all vitamins.                     Do NOT Smoke (Tobacco/Vaping) for 24 hours prior to your procedure.  If you use a CPAP at night, you may bring your mask/headgear for your overnight stay.   Contacts, glasses, piercing's, hearing aid's, dentures or partials may not be worn into surgery, please bring cases for these belongings.    For patients admitted to the hospital, discharge time will be determined by your treatment team.   Patients discharged the day of surgery will not be allowed to drive home, and someone needs to stay with them for 24 hours.  NO VISITORS WILL BE ALLOWED IN PRE-OP WHERE PATIENTS ARE PREPPED FOR SURGERY.  ONLY 1 SUPPORT PERSON MAY BE PRESENT IN THE WAITING ROOM WHILE YOU ARE IN SURGERY.  IF YOU ARE TO BE ADMITTED, ONCE YOU ARE IN YOUR ROOM YOU WILL BE ALLOWED TWO (2) VISITORS. (1) VISITOR MAY STAY OVERNIGHT BUT MUST ARRIVE TO THE ROOM BY 8pm.  Minor children may have two parents present. Special consideration for safety and communication needs will be reviewed on a case by case basis.   Special instructions:   [place]- Preparing For Surgery  Before surgery, you can play  an important role. Because skin is not sterile, your skin needs to be as free of germs as possible. You can reduce the number of germs on your skin by washing with CHG (chlorahexidine gluconate) Soap before surgery.  CHG is an antiseptic cleaner which kills germs and bonds with the skin to continue killing germs even after washing.    Oral Hygiene is also important to reduce your risk of infection.  Remember - BRUSH YOUR TEETH THE MORNING OF SURGERY WITH YOUR REGULAR TOOTHPASTE  Please do not use if you have an allergy to CHG or antibacterial soaps. If your skin becomes reddened/irritated stop using the CHG.  Do not shave (including legs and underarms) for at least 48 hours prior to first CHG shower. It is OK to shave your face.  Please follow these instructions carefully.   Shower the NIGHT BEFORE SURGERY and the MORNING OF SURGERY  If you chose to wash your hair, wash your hair first as usual with your normal shampoo.  After you shampoo, rinse your hair and body thoroughly to remove the shampoo.  Use CHG Soap as you would any other liquid soap. You can apply CHG directly to the skin and wash gently with a scrungie or a clean washcloth.   Apply the CHG Soap to your body ONLY FROM THE NECK DOWN.  Do not use on open wounds or open sores. Avoid contact  with your eyes, ears, mouth and genitals (private parts). Wash Face and genitals (private parts)  with your normal soap.   Wash thoroughly, paying special attention to the area where your surgery will be performed.  Thoroughly rinse your body with warm water from the neck down.  DO NOT shower/wash with your normal soap after using and rinsing off the CHG Soap.  Pat yourself dry with a CLEAN TOWEL.  Wear CLEAN PAJAMAS to bed the night before surgery  Place CLEAN SHEETS on your bed the night before your surgery  DO NOT SLEEP WITH PETS.   Day of Surgery: Shower with CHG soap. Do not wear jewelry, make up, nail polish, gel polish,  artificial nails, or any other type of covering on natural nails including finger and toenails. If patients have artificial nails, gel coating, etc. that need to be removed by a nail salon please have this removed prior to surgery. Surgery may need to be canceled/delayed if the surgeon/anesthesiologist feels like the patient is unable to be adequately monitored. Do not wear lotions, powders, perfumes, or deodorant. Do not shave 48 hours prior to surgery.   Do not bring valuables to the hospital. [facility] is not responsible for any belongings or valuables. Wear Clean/Comfortable clothing the morning of surgery Remember to brush your teeth WITH YOUR REGULAR TOOTHPASTE.   Please read over the following fact sheets that you were given.   3 days prior to your procedure or After your COVID test   You are not required to quarantine however you are required to wear a well-fitting mask when you are out and around people not in your household. If your mask becomes wet or soiled, replace with a new one.   Wash your hands often with soap and water for 20 seconds or clean your hands with an alcohol-based hand sanitizer that contains at least 60% alcohol.   Do not share personal items.   Notify your provider:  o if you are in close contact with someone who has COVID  o or if you develop a fever of 100.4 or greater, sneezing, cough, sore throat, shortness of breath or body aches.

## 2021-03-10 ENCOUNTER — Other Ambulatory Visit: Payer: Self-pay

## 2021-03-10 ENCOUNTER — Encounter (HOSPITAL_COMMUNITY)
Admission: RE | Admit: 2021-03-10 | Discharge: 2021-03-10 | Disposition: A | Discharge status: Home / Self Care | Payer: Medicare HMO | Source: Ambulatory Visit | Attending: Surgery | Admitting: Surgery

## 2021-03-10 ENCOUNTER — Encounter (HOSPITAL_COMMUNITY): Payer: Self-pay

## 2021-03-10 VITALS — BP 115/56 | HR 70 | Temp 98.2°F | Resp 17 | Ht 59.0 in | Wt 115.8 lb

## 2021-03-10 DIAGNOSIS — R7989 Other specified abnormal findings of blood chemistry: Secondary | ICD-10-CM | POA: Insufficient documentation

## 2021-03-10 DIAGNOSIS — I6523 Occlusion and stenosis of bilateral carotid arteries: Secondary | ICD-10-CM

## 2021-03-10 DIAGNOSIS — I2510 Atherosclerotic heart disease of native coronary artery without angina pectoris: Secondary | ICD-10-CM | POA: Insufficient documentation

## 2021-03-10 DIAGNOSIS — D649 Anemia, unspecified: Secondary | ICD-10-CM | POA: Insufficient documentation

## 2021-03-10 DIAGNOSIS — G4733 Obstructive sleep apnea (adult) (pediatric): Secondary | ICD-10-CM | POA: Insufficient documentation

## 2021-03-10 DIAGNOSIS — Z20822 Contact with and (suspected) exposure to covid-19: Secondary | ICD-10-CM | POA: Insufficient documentation

## 2021-03-10 DIAGNOSIS — I5022 Chronic systolic (congestive) heart failure: Secondary | ICD-10-CM | POA: Insufficient documentation

## 2021-03-10 DIAGNOSIS — I739 Peripheral vascular disease, unspecified: Secondary | ICD-10-CM | POA: Insufficient documentation

## 2021-03-10 DIAGNOSIS — Z9989 Dependence on other enabling machines and devices: Secondary | ICD-10-CM | POA: Insufficient documentation

## 2021-03-10 DIAGNOSIS — I110 Hypertensive heart disease with heart failure: Secondary | ICD-10-CM | POA: Insufficient documentation

## 2021-03-10 DIAGNOSIS — Z7902 Long term (current) use of antithrombotics/antiplatelets: Secondary | ICD-10-CM | POA: Insufficient documentation

## 2021-03-10 DIAGNOSIS — Z01812 Encounter for preprocedural laboratory examination: Secondary | ICD-10-CM | POA: Insufficient documentation

## 2021-03-10 DIAGNOSIS — I340 Nonrheumatic mitral (valve) insufficiency: Secondary | ICD-10-CM | POA: Insufficient documentation

## 2021-03-10 DIAGNOSIS — Z7982 Long term (current) use of aspirin: Secondary | ICD-10-CM | POA: Insufficient documentation

## 2021-03-10 DIAGNOSIS — Z01818 Encounter for other preprocedural examination: Secondary | ICD-10-CM

## 2021-03-10 HISTORY — DX: Atherosclerotic heart disease of native coronary artery without angina pectoris: I25.10

## 2021-03-10 LAB — URINALYSIS, ROUTINE W REFLEX MICROSCOPIC
Bilirubin Urine: NEGATIVE
Glucose, UA: >=500 mg/dL — AB
Ketones, ur: NEGATIVE mg/dL
Nitrite: NEGATIVE
Protein, ur: NEGATIVE mg/dL
Specific Gravity, Urine: 1.028 (ref 1.005–1.030)
pH: 5 (ref 5.0–8.0)

## 2021-03-10 LAB — CBC
HCT: 36.7 % (ref 36.0–46.0)
Hemoglobin: 11.8 g/dL — ABNORMAL LOW (ref 12.0–15.0)
MCH: 31.4 pg (ref 26.0–34.0)
MCHC: 32.2 g/dL (ref 30.0–36.0)
MCV: 97.6 fL (ref 80.0–100.0)
Platelets: 219 10*3/uL (ref 150–400)
RBC: 3.76 MIL/uL — ABNORMAL LOW (ref 3.87–5.11)
RDW: 13.3 % (ref 11.5–15.5)
WBC: 7.1 10*3/uL (ref 4.0–10.5)
nRBC: 0 % (ref 0.0–0.2)

## 2021-03-10 LAB — COMPREHENSIVE METABOLIC PANEL
ALT: 16 U/L (ref 0–44)
AST: 25 U/L (ref 15–41)
Albumin: 3.6 g/dL (ref 3.5–5.0)
Alkaline Phosphatase: 60 U/L (ref 38–126)
Anion gap: 8 (ref 5–15)
BUN: 18 mg/dL (ref 8–23)
CO2: 23 mmol/L (ref 22–32)
Calcium: 9.5 mg/dL (ref 8.9–10.3)
Chloride: 105 mmol/L (ref 98–111)
Creatinine, Ser: 1.34 mg/dL — ABNORMAL HIGH (ref 0.44–1.00)
GFR, Estimated: 41 mL/min — ABNORMAL LOW (ref >60)
Glucose, Bld: 94 mg/dL (ref 70–99)
Potassium: 4.1 mmol/L (ref 3.5–5.1)
Sodium: 136 mmol/L (ref 135–145)
Total Bilirubin: 0.6 mg/dL (ref 0.3–1.2)
Total Protein: 7.1 g/dL (ref 6.5–8.1)

## 2021-03-10 LAB — SURGICAL PCR SCREEN
MRSA, PCR: NEGATIVE
Staphylococcus aureus: NEGATIVE

## 2021-03-10 LAB — PROTIME-INR
INR: 1 (ref 0.8–1.2)
Prothrombin Time: 12.7 seconds (ref 11.4–15.2)

## 2021-03-10 LAB — TYPE AND SCREEN
ABO/RH(D): O NEG
Antibody Screen: NEGATIVE

## 2021-03-10 LAB — SARS CORONAVIRUS 2 (TAT 6-24 HRS): SARS Coronavirus 2: NEGATIVE

## 2021-03-10 LAB — APTT: aPTT: 29 seconds (ref 24–36)

## 2021-03-10 NOTE — Progress Notes (Signed)
LVM with surgical scheduler at [facility] regarding pt's abnormal UA.  Jacqlyn Larsen, RN

## 2021-03-10 NOTE — Progress Notes (Signed)
PCP - Dr. Betsey Amen Cardiologist - Dr. Levada Dy  PPM/ICD - n/a  Chest x-ray -n/a  EKG - 12/30/20 Stress Test - 08/24/19 ECHO - 11/04/20 Cardiac Cath - 12/30/20  Sleep Study - OSA+ CPAP - uses nightly  Blood Thinner Instructions: Plavix, per Dr. Stephens Shire note, continue Plavix Aspirin Instructions: Per Dr. Stephens Shire note, continue ASA.  NPO  COVID TEST- 03/10/21; done in PAT  Anesthesia review: Yes, hx of CAD.  Patient denies shortness of breath, fever, cough and chest pain at PAT appointment   All instructions explained to the patient, with a verbal understanding of the material. Patient agrees to go over the instructions while at home for a better understanding. Patient also instructed to self quarantine after being tested for COVID-19. The opportunity to ask questions was provided.

## 2021-03-11 NOTE — Progress Notes (Signed)
Anesthesia Chart Review:  Follows with cardiology for hx of CAD, HFrEF, HTN, Moderate MR. Clearance per telephone encounter 02/23/21, "Chart reviewed as part of pre-operative protocol coverage. Patient has a significant history of CAD and PAD. Recent cardiac catheterization in 12/2020 showed severe multivessel CAD involving all 3 major coronary arteries with 99% stenosis of proximal RCA followed by 100% stenosis of proximal to mid RCA and then 99% stenosis of mid to distal RCA (with extensive left to right collaterals from the LAD and septal perforating arteries) as well as 70% stenosis of OM2, and 50% stenosis of mid LAD. Medical therapy was recommended at that time. Cardiac MRI in 11/2020 showed subendocardial LGE consistent with prior infarct. Patient was last seen by Dr. Gardiner Rhyme in 12/2020 at which time she was doing well from a cardiac standpoint. She has now been found to have high grade stenosis of left ICA and plan is for left CEA ASAP. Patient was contacted today for additional pre-op evaluation and reported doing well since last visit. She reports very brief and atypical chest sensation when she yawns but no prolonged or exertion chest pain. Nothing concerning for angina. No new or worsening dyspnea. No acute CHF symptoms. No palpitations or syncope. I personally discussed patient with Dr. Gardiner Rhyme - patient is at a higher risk but not prohibitive risk. She is well optimized from a cardiac standpoint and no further cardiovascular testing is necessary prior to planned procedure. I did discuss this with patient and she understands she is at a higher risk."  Per surgery posting, patient to continue Plavix and aspirin.  OSA on CPAP.  Preop labs reviewed, creatinine mildly elevated 1.34, mild anemia with hemoglobin 11.8, otherwise unremarkable.  EKG 12/30/20: Normal sinus rhythm. Rate 64. Low voltage QRS. T wave abnormality, consider inferior ischemia  Cath 12/30/20:   Prox RCA lesion is 99%  stenosed.   Prox RCA to Mid RCA lesion is 100% stenosed.   Ost Cx to Prox Cx lesion is 10% stenosed.   Mid LAD lesion is 50% stenosed.   Mid RCA to Dist RCA lesion is 99% stenosed.   Prox Cx lesion is 20% stenosed.   2nd Mrg lesion is 70% stenosed.   LV end diastolic pressure is mildly elevated.   There is evidence for severe diffuse multivessel coronary calcification involving all 3 major coronary arteries.  The proximal LAD has focal  50% calcification just proximal to diagonal bifurcation; the circumflex vessel has 20 and 70% eccentric calcification and the RCA is severely calcified with segmental subtotal stenoses with mid bridging collaterals and extensive left-to-right collateralization from the LAD and septal perforating arteries supplying the PDA.   Mild to moderate LV dysfunction with EF estimated approximately 45%.  There is significant mid to apical inferior hypocontractility with mild mid anterolateral hypocontractility.  LVEDP 16 mmHg.   RECOMMENDATION: Medical therapy for CAD with continuation of DAPT and aggressive lipid-lowering therapy with target LDL in the mid 61s or below.  TTE 11/04/20:  1. There is moderate to severe mitral regurgitation. There is an  eccentric posteriorly directed jet of MR. This appears to be related to  tethering and restricted movement of the PMVL in systole (IIIB). Some of  the images suggest hockey stick deformity  of the AMVL, which would indicated possible rheumatic MV disease. Would  recommend a TEE for clarification of MR severity and mechanism. Elevated  RVSP and E > A mitral inflow suggest significant MR. The mitral valve is  abnormal. Moderate to severe  mitral  valve regurgitation. No evidence of mitral stenosis.   2. Left ventricular ejection fraction, by estimation, is 45 to 50%. The  left ventricle has mildly decreased function. The left ventricle has no  regional wall motion abnormalities. Left ventricular diastolic function  could  not be evaluated. The average  left ventricular global longitudinal strain is -14.6 %. The global  longitudinal strain is abnormal.   3. Right ventricular systolic function is normal. The right ventricular  size is normal. There is mildly elevated pulmonary artery systolic  pressure. The estimated right ventricular systolic pressure is 29.4 mmHg.   4. Left atrial size was mildly dilated.   5. The aortic valve is tricuspid. There is mild thickening of the aortic  valve. Aortic valve regurgitation is not visualized. No aortic stenosis is  present.   6. The inferior vena cava is normal in size with greater than 50%  respiratory variability, suggesting right atrial pressure of 3 mmHg.   Comparison(s): No significant change from prior study.    Wynonia Musty [facility] Stay [facility]/Anesthesiology Phone [phone] 03/11/2021 12:13 PM

## 2021-03-11 NOTE — Anesthesia Preprocedure Evaluation (Addendum)
Anesthesia Evaluation  Patient identified by MRN, date of birth, ID band Patient awake    Reviewed: Allergy & Precautions, H&P , NPO status , Patient's Chart, lab work & pertinent test results, reviewed documented beta blocker date and time   Airway Mallampati: III  TM Distance: >3 FB Neck ROM: Full    Dental no notable dental hx. (+) Teeth Intact, Dental Advisory Given   Pulmonary sleep apnea and Continuous Positive Airway Pressure Ventilation , former smoker,    Pulmonary exam normal breath sounds clear to auscultation       Cardiovascular Exercise Tolerance: Good hypertension, Pt. on medications and Pt. on home beta blockers + CAD and + Peripheral Vascular Disease  + Valvular Problems/Murmurs MR  Rhythm:Regular Rate:Normal     Neuro/Psych negative neurological ROS  negative psych ROS   GI/Hepatic negative GI ROS, Neg liver ROS,   Endo/Other  negative endocrine ROS  Renal/GU negative Renal ROS  negative genitourinary   Musculoskeletal  (+) Arthritis , Osteoarthritis,    Abdominal   Peds  Hematology negative hematology ROS (+)   Anesthesia Other Findings   Reproductive/Obstetrics negative OB ROS                           Anesthesia Physical Anesthesia Plan  ASA: 4  Anesthesia Plan: General   Post-op Pain Management: Tylenol PO (pre-op)*   Induction: Intravenous  PONV Risk Score and Plan: 4 or greater and Ondansetron, Dexamethasone and Treatment may vary due to age or medical condition  Airway Management Planned: Oral ETT  Additional Equipment: Arterial line  Intra-op Plan:   Post-operative Plan: Extubation in OR  Informed Consent: I have reviewed the patients History and Physical, chart, labs and discussed the procedure including the risks, benefits and alternatives for the proposed anesthesia with the patient or authorized representative who has indicated his/her  understanding and acceptance.     Dental advisory given  Plan Discussed with: CRNA  Anesthesia Plan Comments: (PAT note by Karoline Caldwell, PA-C: Follows with cardiology for hx of CAD, HFrEF, HTN, Moderate MR. Clearance per telephone encounter 02/23/21, "Chart reviewed as part of pre-operative protocol coverage.Patient has a significant history of CAD and PAD. Recent cardiac catheterization in 12/2020 showed severe multivessel CAD involving all 3 major coronary arteries with 99% stenosis of proximal RCA followed by 100% stenosis of proximal to mid RCA and then 99% stenosis of mid to distal RCA (with extensive left to right collaterals from the LAD and septal perforating arteries) as well as 70% stenosis of OM2, and 50% stenosis of mid LAD. Medical therapy was recommended at that time. Cardiac MRI in 11/2020 showed subendocardial LGE consistent with prior infarct. Patient was last seen by Dr. Gardiner Rhyme in 12/2020 at which time she was doing well from a cardiac standpoint. She has now been found to have high grade stenosis of left ICA and plan is for left CEA ASAP. Patient was contacted today for additional pre-op evaluation and reported doing well since last visit. She reports very brief and atypical chest sensation when she yawns but no prolonged or exertion chest pain. Nothing concerning for angina. No new or worsening dyspnea. No acute CHF symptoms. No palpitations or syncope. I personally discussed patient with Dr. Gardiner Rhyme - patient is at a higher risk but not prohibitive risk. She is well optimized from a cardiac standpoint and no further cardiovascular testing is necessary prior to planned procedure. I did discuss this with patient and  she understands she is at a higher risk."  Per surgery posting, patient to continue Plavix and aspirin.  OSA on CPAP.  Preop labs reviewed, creatinine mildly elevated 1.34, mild anemia with hemoglobin 11.8, otherwise unremarkable.  EKG 12/30/20: Normal sinus rhythm.  Rate 64. Low voltage QRS. T wave abnormality, consider inferior ischemia  Cath 12/30/20:  Prox RCA lesion is 99% stenosed.  Prox RCA to Mid RCA lesion is 100% stenosed.  Ost Cx to Prox Cx lesion is 10% stenosed.  Mid LAD lesion is 50% stenosed.  Mid RCA to Dist RCA lesion is 99% stenosed.  Prox Cx lesion is 20% stenosed.  2nd Mrg lesion is 70% stenosed.  LV end diastolic pressure is mildly elevated.  There is evidence for severe diffuse multivessel coronary calcification involving all 3 major coronary arteries. The proximal LAD has focal 50% calcification just proximal to diagonal bifurcation; the circumflex vessel has 20 and 70% eccentric calcification and the RCA is severely calcified with segmental subtotal stenoses with mid bridging collaterals and extensive left-to-right collateralization from the LAD and septal perforating arteries supplying the PDA.  Mild to moderate LV dysfunction with EF estimated approximately 45%. There is significant mid to apical inferior hypocontractility with mild mid anterolateral hypocontractility. LVEDP 16 mmHg.  RECOMMENDATION: Medical therapy for CAD with continuation of DAPT and aggressive lipid-lowering therapy with target LDL in the mid 38s or below.  TTE 11/04/20: 1. There is moderate to severe mitral regurgitation. There is an  eccentric posteriorly directed jet of MR. This appears to be related to  tethering and restricted movement of the PMVL in systole (IIIB). Some of  the images suggest hockey stick deformity  of the AMVL, which would indicated possible rheumatic MV disease. Would  recommend a TEE for clarification of MR severity and mechanism. Elevated  RVSP and E > A mitral inflow suggest significant MR. The mitral valve is  abnormal. Moderate to severe mitral  valve regurgitation. No evidence of mitral stenosis.  2. Left ventricular ejection fraction, by estimation, is 45 to 50%. The  left ventricle has mildly  decreased function. The left ventricle has no  regional wall motion abnormalities. Left ventricular diastolic function  could not be evaluated. The average  left ventricular global longitudinal strain is -14.6 %. The global  longitudinal strain is abnormal.  3. Right ventricular systolic function is normal. The right ventricular  size is normal. There is mildly elevated pulmonary artery systolic  pressure. The estimated right ventricular systolic pressure is 54.0 mmHg.  4. Left atrial size was mildly dilated.  5. The aortic valve is tricuspid. There is mild thickening of the aortic  valve. Aortic valve regurgitation is not visualized. No aortic stenosis is  present.  6. The inferior vena cava is normal in size with greater than 50%  respiratory variability, suggesting right atrial pressure of 3 mmHg.   Comparison(s): No significant change from prior study.    )      Anesthesia Quick Evaluation

## 2021-03-12 ENCOUNTER — Telehealth: Payer: Self-pay | Admitting: *Deleted

## 2021-03-12 ENCOUNTER — Other Ambulatory Visit: Payer: Self-pay | Admitting: Physician Assistant

## 2021-03-12 DIAGNOSIS — I70213 Atherosclerosis of native arteries of extremities with intermittent claudication, bilateral legs: Secondary | ICD-10-CM

## 2021-03-12 MED ORDER — SULFAMETHOXAZOLE-TRIMETHOPRIM 800-160 MG PO TABS: 1.0000 | ORAL_TABLET | Freq: Two times a day (BID) | ORAL | 0 refills | Status: DC

## 2021-03-12 NOTE — Telephone Encounter (Signed)
-----   Message from Marguerita Merles, RN sent at 03/12/2021  7:40 AM EST ----- Regarding: FW: Abnormal U/A ----- Message ----- From: Serafina Mitchell, MD Sent: 03/11/2021  11:01 PM EST To: Marguerita Merles, RN Subject: RE: Abnormal U/A                               Yes start him on BACTRIM ----- Message ----- From: Marguerita Merles, RN Sent: 03/10/2021   1:46 PM EST To: Serafina Mitchell, MD Subject: Abnormal U/A                                   Dr. Trula Slade, this pt had an abnormal UA today when she was at pre adm testing. "Moderate leukocytes" and "MANY bacteria". Did you want [place] to order Bactrim? Thanks, Izora Gala

## 2021-03-12 NOTE — Telephone Encounter (Signed)
Maureen PA sent in Rx for patient. Notified patient.

## 2021-03-13 ENCOUNTER — Inpatient Hospital Stay (HOSPITAL_COMMUNITY): Payer: Medicare HMO | Admitting: Certified Registered Nurse Anesthetist

## 2021-03-13 ENCOUNTER — Other Ambulatory Visit: Payer: Self-pay

## 2021-03-13 ENCOUNTER — Encounter (HOSPITAL_COMMUNITY): Payer: Self-pay | Admitting: Surgery

## 2021-03-13 ENCOUNTER — Inpatient Hospital Stay (HOSPITAL_COMMUNITY)
Admission: RE | Admit: 2021-03-13 | Discharge: 2021-03-14 | DRG: 039 | Disposition: A | Discharge status: Home / Self Care | Payer: Medicare HMO | Attending: Surgery | Admitting: Surgery

## 2021-03-13 ENCOUNTER — Encounter (HOSPITAL_COMMUNITY)
Admission: RE | Disposition: A | Discharge status: Home / Self Care | Payer: Self-pay | Source: Home / Self Care | Attending: Surgery

## 2021-03-13 ENCOUNTER — Inpatient Hospital Stay (HOSPITAL_COMMUNITY): Payer: Medicare HMO | Admitting: Physician Assistant

## 2021-03-13 DIAGNOSIS — G4730 Sleep apnea, unspecified: Secondary | ICD-10-CM | POA: Diagnosis present

## 2021-03-13 DIAGNOSIS — I739 Peripheral vascular disease, unspecified: Secondary | ICD-10-CM

## 2021-03-13 DIAGNOSIS — Z79899 Other long term (current) drug therapy: Secondary | ICD-10-CM | POA: Diagnosis not present

## 2021-03-13 DIAGNOSIS — Z8261 Family history of arthritis: Secondary | ICD-10-CM

## 2021-03-13 DIAGNOSIS — R112 Nausea with vomiting, unspecified: Secondary | ICD-10-CM | POA: Diagnosis not present

## 2021-03-13 DIAGNOSIS — Z87891 Personal history of nicotine dependence: Secondary | ICD-10-CM

## 2021-03-13 DIAGNOSIS — I2510 Atherosclerotic heart disease of native coronary artery without angina pectoris: Secondary | ICD-10-CM | POA: Diagnosis not present

## 2021-03-13 DIAGNOSIS — Z20822 Contact with and (suspected) exposure to covid-19: Secondary | ICD-10-CM | POA: Diagnosis present

## 2021-03-13 DIAGNOSIS — I959 Hypotension, unspecified: Secondary | ICD-10-CM | POA: Diagnosis not present

## 2021-03-13 DIAGNOSIS — I6522 Occlusion and stenosis of left carotid artery: Secondary | ICD-10-CM

## 2021-03-13 DIAGNOSIS — Z7983 Long term (current) use of bisphosphonates: Secondary | ICD-10-CM | POA: Diagnosis not present

## 2021-03-13 DIAGNOSIS — Z7902 Long term (current) use of antithrombotics/antiplatelets: Secondary | ICD-10-CM | POA: Diagnosis not present

## 2021-03-13 DIAGNOSIS — Z95820 Peripheral vascular angioplasty status with implants and grafts: Secondary | ICD-10-CM

## 2021-03-13 DIAGNOSIS — I10 Essential (primary) hypertension: Secondary | ICD-10-CM

## 2021-03-13 DIAGNOSIS — Z9989 Dependence on other enabling machines and devices: Secondary | ICD-10-CM | POA: Diagnosis not present

## 2021-03-13 DIAGNOSIS — I70212 Atherosclerosis of native arteries of extremities with intermittent claudication, left leg: Secondary | ICD-10-CM | POA: Diagnosis present

## 2021-03-13 DIAGNOSIS — G4733 Obstructive sleep apnea (adult) (pediatric): Secondary | ICD-10-CM | POA: Diagnosis not present

## 2021-03-13 DIAGNOSIS — E7800 Pure hypercholesterolemia, unspecified: Secondary | ICD-10-CM | POA: Diagnosis not present

## 2021-03-13 DIAGNOSIS — Z8249 Family history of ischemic heart disease and other diseases of the circulatory system: Secondary | ICD-10-CM

## 2021-03-13 DIAGNOSIS — Z7984 Long term (current) use of oral hypoglycemic drugs: Secondary | ICD-10-CM | POA: Diagnosis not present

## 2021-03-13 DIAGNOSIS — Z7982 Long term (current) use of aspirin: Secondary | ICD-10-CM

## 2021-03-13 DIAGNOSIS — Z823 Family history of stroke: Secondary | ICD-10-CM

## 2021-03-13 HISTORY — PX: ENDARTERECTOMY: SHX5162

## 2021-03-13 LAB — CBC
HCT: 28.8 % — ABNORMAL LOW (ref 36.0–46.0)
Hemoglobin: 9.7 g/dL — ABNORMAL LOW (ref 12.0–15.0)
MCH: 31.7 pg (ref 26.0–34.0)
MCHC: 33.7 g/dL (ref 30.0–36.0)
MCV: 94.1 fL (ref 80.0–100.0)
Platelets: 167 10*3/uL (ref 150–400)
RBC: 3.06 MIL/uL — ABNORMAL LOW (ref 3.87–5.11)
RDW: 13 % (ref 11.5–15.5)
WBC: 7.3 10*3/uL (ref 4.0–10.5)
nRBC: 0 % (ref 0.0–0.2)

## 2021-03-13 LAB — CREATININE, SERUM
Creatinine, Ser: 0.72 mg/dL (ref 0.44–1.00)
GFR, Estimated: >60 mL/min (ref >60)

## 2021-03-13 SURGERY — ENDARTERECTOMY, CAROTID
Anesthesia: General | Site: Neck | Laterality: Left

## 2021-03-13 MED ORDER — LACTATED RINGERS IV SOLN: INTRAVENOUS | Status: DC

## 2021-03-13 MED ORDER — AMISULPRIDE (ANTIEMETIC) 5 MG/2ML IV SOLN
5.0000 mg | Freq: Once | INTRAVENOUS | Status: AC
  Administered 2021-03-13: 5 mg via INTRAVENOUS

## 2021-03-13 MED ORDER — SODIUM CHLORIDE 0.9 % IV SOLN: INTRAVENOUS | Status: DC

## 2021-03-13 MED ORDER — PHENOL 1.4 % MT LIQD: 1.0000 | OROMUCOSAL | Status: DC | PRN

## 2021-03-13 MED ORDER — PANTOPRAZOLE SODIUM 40 MG PO TBEC
40.0000 mg | DELAYED_RELEASE_TABLET | Freq: Every day | ORAL | Status: DC
  Administered 2021-03-13 – 2021-03-14 (×2): 40 mg via ORAL
  Filled 2021-03-13 (×2): qty 1

## 2021-03-13 MED ORDER — SENNOSIDES-DOCUSATE SODIUM 8.6-50 MG PO TABS: 1.0000 | ORAL_TABLET | Freq: Every evening | ORAL | Status: DC | PRN

## 2021-03-13 MED ORDER — HYDROMORPHONE HCL 1 MG/ML IJ SOLN: 0.2500 mg | INTRAMUSCULAR | Status: DC | PRN

## 2021-03-13 MED ORDER — POTASSIUM CHLORIDE CRYS ER 20 MEQ PO TBCR: 20.0000 meq | EXTENDED_RELEASE_TABLET | Freq: Every day | ORAL | Status: DC | PRN

## 2021-03-13 MED ORDER — PROTAMINE SULFATE 10 MG/ML IV SOLN
INTRAVENOUS | Status: DC | PRN
  Administered 2021-03-13: 50 mg via INTRAVENOUS

## 2021-03-13 MED ORDER — PROPOFOL 500 MG/50ML IV EMUL
INTRAVENOUS | Status: DC | PRN
  Administered 2021-03-13: 75 ug/kg/min via INTRAVENOUS

## 2021-03-13 MED ORDER — ACETAMINOPHEN 500 MG PO TABS
1000.0000 mg | ORAL_TABLET | Freq: Once | ORAL | Status: AC
  Administered 2021-03-13: 1000 mg via ORAL
  Filled 2021-03-13: qty 2

## 2021-03-13 MED ORDER — HYDRALAZINE HCL 20 MG/ML IJ SOLN: 5.0000 mg | INTRAMUSCULAR | Status: DC | PRN

## 2021-03-13 MED ORDER — ORAL CARE MOUTH RINSE: 15.0000 mL | Freq: Once | OROMUCOSAL | Status: AC

## 2021-03-13 MED ORDER — HYDROMORPHONE HCL 1 MG/ML IJ SOLN: 0.5000 mg | INTRAMUSCULAR | Status: DC | PRN

## 2021-03-13 MED ORDER — FENTANYL CITRATE (PF) 250 MCG/5ML IJ SOLN
INTRAMUSCULAR | Status: AC
  Filled 2021-03-13: qty 5

## 2021-03-13 MED ORDER — EMPAGLIFLOZIN 10 MG PO TABS
10.0000 mg | ORAL_TABLET | Freq: Every day | ORAL | Status: DC
  Administered 2021-03-14: 10 mg via ORAL
  Filled 2021-03-13: qty 1

## 2021-03-13 MED ORDER — ASPIRIN EC 81 MG PO TBEC
81.0000 mg | DELAYED_RELEASE_TABLET | Freq: Every day | ORAL | Status: DC
Start: 2021-03-13 — End: 2021-03-14
  Administered 2021-03-13 – 2021-03-14 (×2): 81 mg via ORAL
  Filled 2021-03-13 (×2): qty 1

## 2021-03-13 MED ORDER — PHENYLEPHRINE 40 MCG/ML (10ML) SYRINGE FOR IV PUSH (FOR BLOOD PRESSURE SUPPORT)
PREFILLED_SYRINGE | INTRAVENOUS | Status: DC | PRN
  Administered 2021-03-13 (×2): 80 ug via INTRAVENOUS

## 2021-03-13 MED ORDER — HEPARIN SODIUM (PORCINE) 5000 UNIT/ML IJ SOLN
5000.0000 [IU] | Freq: Three times a day (TID) | INTRAMUSCULAR | Status: DC
  Administered 2021-03-14: 5000 [IU] via SUBCUTANEOUS
  Filled 2021-03-13: qty 1

## 2021-03-13 MED ORDER — PROPOFOL 10 MG/ML IV BOLUS
INTRAVENOUS | Status: DC | PRN
  Administered 2021-03-13: 50 mg via INTRAVENOUS

## 2021-03-13 MED ORDER — CEFAZOLIN SODIUM-DEXTROSE 2-4 GM/100ML-% IV SOLN
2.0000 g | INTRAVENOUS | Status: AC
  Administered 2021-03-13: 2 g via INTRAVENOUS
  Filled 2021-03-13: qty 100

## 2021-03-13 MED ORDER — SODIUM CHLORIDE 0.9 % IV SOLN: 500.0000 mL | Freq: Once | INTRAVENOUS | Status: DC | PRN

## 2021-03-13 MED ORDER — CHLORHEXIDINE GLUCONATE CLOTH 2 % EX PADS: 6.0000 | MEDICATED_PAD | Freq: Once | CUTANEOUS | Status: DC

## 2021-03-13 MED ORDER — LOSARTAN POTASSIUM 25 MG PO TABS
25.0000 mg | ORAL_TABLET | Freq: Every day | ORAL | Status: DC
  Administered 2021-03-13: 25 mg via ORAL
  Filled 2021-03-13 (×2): qty 1

## 2021-03-13 MED ORDER — CLOPIDOGREL BISULFATE 75 MG PO TABS
75.0000 mg | ORAL_TABLET | Freq: Every day | ORAL | Status: DC
  Administered 2021-03-14: 75 mg via ORAL
  Filled 2021-03-13: qty 1

## 2021-03-13 MED ORDER — HEMOSTATIC AGENTS (NO CHARGE) OPTIME
TOPICAL | Status: DC | PRN
  Administered 2021-03-13: 1 via TOPICAL

## 2021-03-13 MED ORDER — FENTANYL CITRATE (PF) 250 MCG/5ML IJ SOLN
INTRAMUSCULAR | Status: DC | PRN
  Administered 2021-03-13 (×3): 50 ug via INTRAVENOUS

## 2021-03-13 MED ORDER — METOPROLOL TARTRATE 5 MG/5ML IV SOLN: 2.0000 mg | INTRAVENOUS | Status: DC | PRN

## 2021-03-13 MED ORDER — ACETAMINOPHEN 650 MG RE SUPP: 325.0000 mg | RECTAL | Status: DC | PRN

## 2021-03-13 MED ORDER — CEFAZOLIN SODIUM-DEXTROSE 2-4 GM/100ML-% IV SOLN
2.0000 g | Freq: Two times a day (BID) | INTRAVENOUS | Status: AC
  Administered 2021-03-13: 2 g via INTRAVENOUS
  Filled 2021-03-13: qty 100

## 2021-03-13 MED ORDER — HEPARIN 6000 UNIT IRRIGATION SOLUTION
Status: AC
  Filled 2021-03-13: qty 500

## 2021-03-13 MED ORDER — CHLORHEXIDINE GLUCONATE 0.12 % MT SOLN
15.0000 mL | Freq: Once | OROMUCOSAL | Status: AC
  Administered 2021-03-13: 15 mL via OROMUCOSAL
  Filled 2021-03-13: qty 15

## 2021-03-13 MED ORDER — ACETAMINOPHEN 325 MG PO TABS: 325.0000 mg | ORAL_TABLET | ORAL | Status: DC | PRN

## 2021-03-13 MED ORDER — LIDOCAINE HCL (PF) 1 % IJ SOLN
INTRAMUSCULAR | Status: AC
  Filled 2021-03-13: qty 30

## 2021-03-13 MED ORDER — ROCURONIUM BROMIDE 10 MG/ML (PF) SYRINGE
PREFILLED_SYRINGE | INTRAVENOUS | Status: DC | PRN
Start: 2021-03-13 — End: 2021-03-13
  Administered 2021-03-13: 10 mg via INTRAVENOUS
  Administered 2021-03-13: 50 mg via INTRAVENOUS

## 2021-03-13 MED ORDER — LABETALOL HCL 5 MG/ML IV SOLN: 10.0000 mg | INTRAVENOUS | Status: DC | PRN

## 2021-03-13 MED ORDER — LIDOCAINE 2% (20 MG/ML) 5 ML SYRINGE
INTRAMUSCULAR | Status: DC | PRN
  Administered 2021-03-13: 60 mg via INTRAVENOUS

## 2021-03-13 MED ORDER — TRAMADOL HCL 50 MG PO TABS: 50.0000 mg | ORAL_TABLET | Freq: Four times a day (QID) | ORAL | Status: DC | PRN

## 2021-03-13 MED ORDER — ONDANSETRON HCL 4 MG/2ML IJ SOLN
INTRAMUSCULAR | Status: DC | PRN
Start: 2021-03-13 — End: 2021-03-13
  Administered 2021-03-13: 4 mg via INTRAVENOUS

## 2021-03-13 MED ORDER — DOCUSATE SODIUM 100 MG PO CAPS
100.0000 mg | ORAL_CAPSULE | Freq: Every day | ORAL | Status: DC
  Filled 2021-03-13: qty 1

## 2021-03-13 MED ORDER — ONDANSETRON HCL 4 MG/2ML IJ SOLN
4.0000 mg | Freq: Four times a day (QID) | INTRAMUSCULAR | Status: DC | PRN
  Administered 2021-03-13: 4 mg via INTRAVENOUS
  Filled 2021-03-13: qty 2

## 2021-03-13 MED ORDER — HEPARIN 6000 UNIT IRRIGATION SOLUTION
Status: DC | PRN
Start: 2021-03-13 — End: 2021-03-13
  Administered 2021-03-13: 1

## 2021-03-13 MED ORDER — MAGNESIUM SULFATE 2 GM/50ML IV SOLN: 2.0000 g | Freq: Every day | INTRAVENOUS | Status: DC | PRN

## 2021-03-13 MED ORDER — PHENYLEPHRINE HCL-NACL 20-0.9 MG/250ML-% IV SOLN
INTRAVENOUS | Status: DC | PRN
Start: 2021-03-13 — End: 2021-03-13
  Administered 2021-03-13: 25 ug/min via INTRAVENOUS

## 2021-03-13 MED ORDER — DEXAMETHASONE SODIUM PHOSPHATE 10 MG/ML IJ SOLN
INTRAMUSCULAR | Status: DC | PRN
  Administered 2021-03-13: 10 mg via INTRAVENOUS

## 2021-03-13 MED ORDER — 0.9 % SODIUM CHLORIDE (POUR BTL) OPTIME
TOPICAL | Status: DC | PRN
Start: 2021-03-13 — End: 2021-03-13
  Administered 2021-03-13: 1000 mL

## 2021-03-13 MED ORDER — EZETIMIBE 10 MG PO TABS
10.0000 mg | ORAL_TABLET | Freq: Every day | ORAL | Status: DC
  Administered 2021-03-14: 10 mg via ORAL
  Filled 2021-03-13: qty 1

## 2021-03-13 MED ORDER — HEPARIN SODIUM (PORCINE) 1000 UNIT/ML IJ SOLN
INTRAMUSCULAR | Status: DC | PRN
  Administered 2021-03-13: 5500 [IU] via INTRAVENOUS

## 2021-03-13 MED ORDER — GUAIFENESIN-DM 100-10 MG/5ML PO SYRP: 15.0000 mL | ORAL_SOLUTION | ORAL | Status: DC | PRN

## 2021-03-13 MED ORDER — AMISULPRIDE (ANTIEMETIC) 5 MG/2ML IV SOLN
INTRAVENOUS | Status: AC
  Filled 2021-03-13: qty 2

## 2021-03-13 MED ORDER — CARVEDILOL 3.125 MG PO TABS
3.1250 mg | ORAL_TABLET | Freq: Two times a day (BID) | ORAL | Status: DC
  Administered 2021-03-13 – 2021-03-14 (×2): 3.125 mg via ORAL
  Filled 2021-03-13 (×3): qty 1

## 2021-03-13 MED ORDER — ROSUVASTATIN CALCIUM 20 MG PO TABS
40.0000 mg | ORAL_TABLET | Freq: Every day | ORAL | Status: DC
  Administered 2021-03-14: 40 mg via ORAL
  Filled 2021-03-13: qty 2

## 2021-03-13 MED ORDER — ALUM & MAG HYDROXIDE-SIMETH 200-200-20 MG/5ML PO SUSP: 15.0000 mL | ORAL | Status: DC | PRN

## 2021-03-13 MED ORDER — SULFAMETHOXAZOLE-TRIMETHOPRIM 400-80 MG PO TABS
1.0000 | ORAL_TABLET | Freq: Two times a day (BID) | ORAL | Status: DC
  Administered 2021-03-13 – 2021-03-14 (×2): 1 via ORAL
  Filled 2021-03-13 (×3): qty 1

## 2021-03-13 SURGICAL SUPPLY — 51 items
BAG COUNTER SPONGE SURGICOUNT (BAG) ×2 IMPLANT
CANISTER SUCT 3000ML PPV (MISCELLANEOUS) ×2 IMPLANT
CATH ROBINSON RED A/P 18FR (CATHETERS) ×2 IMPLANT
CATH SUCT 10FR WHISTLE TIP (CATHETERS) ×2 IMPLANT
CATH SUCT ARGYLE CHIMNEY 10FR (CATHETERS) IMPLANT
CLIP TI MEDIUM 24 (CLIP) ×1 IMPLANT
CLIP TI WIDE RED SMALL 24 (CLIP) ×1 IMPLANT
CLIP VESOCCLUDE MED 6/CT (CLIP) ×2 IMPLANT
CLIP VESOCCLUDE SM WIDE 6/CT (CLIP) ×2 IMPLANT
COVER PROBE W GEL 5X96 (DRAPES) IMPLANT
DERMABOND ADVANCED (GAUZE/BANDAGES/DRESSINGS) ×1
DERMABOND ADVANCED .7 DNX12 (GAUZE/BANDAGES/DRESSINGS) ×1 IMPLANT
DRAIN CHANNEL 15F RND FF W/TCR (WOUND CARE) IMPLANT
ELECT REM PT RETURN 9FT ADLT (ELECTROSURGICAL) ×2
ELECTRODE REM PT RTRN 9FT ADLT (ELECTROSURGICAL) ×1 IMPLANT
EVACUATOR SILICONE 100CC (DRAIN) IMPLANT
GLOVE SRG 8 PF TXTR STRL LF DI (GLOVE) ×1 IMPLANT
GLOVE SURG POLYISO LF SZ7.5 (GLOVE) ×2 IMPLANT
GLOVE SURG UNDER POLY LF SZ8 (GLOVE) ×2
GOWN STRL REUS W/ TWL LRG LVL3 (GOWN DISPOSABLE) ×2 IMPLANT
GOWN STRL REUS W/ TWL XL LVL3 (GOWN DISPOSABLE) ×1 IMPLANT
GOWN STRL REUS W/TWL LRG LVL3 (GOWN DISPOSABLE) ×4
GOWN STRL REUS W/TWL XL LVL3 (GOWN DISPOSABLE) ×2
HEMOSTAT SNOW SURGICEL 2X4 (HEMOSTASIS) IMPLANT
INSERT FOGARTY SM (MISCELLANEOUS) IMPLANT
KIT BASIN OR (CUSTOM PROCEDURE TRAY) ×2 IMPLANT
KIT SUCTION CATH 10FR (CATHETERS) ×1
KIT TURNOVER KIT B (KITS) ×2 IMPLANT
NDL HYPO 25GX1X1/2 BEV (NEEDLE) IMPLANT
NEEDLE HYPO 25GX1X1/2 BEV (NEEDLE) IMPLANT
NS IRRIG 1000ML POUR BTL (IV SOLUTION) ×6 IMPLANT
PACK CAROTID (CUSTOM PROCEDURE TRAY) ×2 IMPLANT
PAD ARMBOARD 7.5X6 YLW CONV (MISCELLANEOUS) ×4 IMPLANT
PATCH VASC XENOSURE 1CMX6CM (Vascular Products) ×2 IMPLANT
PATCH VASC XENOSURE 1X6 (Vascular Products) IMPLANT
POSITIONER HEAD DONUT 9IN (MISCELLANEOUS) ×2 IMPLANT
SET WALTER ACTIVATION W/DRAPE (SET/KITS/TRAYS/PACK) IMPLANT
SHUNT CAROTID BYPASS 10 (VASCULAR PRODUCTS) IMPLANT
SHUNT CAROTID BYPASS 12FRX15.5 (VASCULAR PRODUCTS) IMPLANT
SURGIFLO W/THROMBIN 8M KIT (HEMOSTASIS) ×1 IMPLANT
SUT ETHILON 3 0 PS 1 (SUTURE) IMPLANT
SUT PROLENE 6 0 BV (SUTURE) ×9 IMPLANT
SUT PROLENE 7 0 BV1 MDA (SUTURE) ×1 IMPLANT
SUT SILK 3 0 (SUTURE)
SUT SILK 3-0 18XBRD TIE 12 (SUTURE) IMPLANT
SUT VIC AB 3-0 SH 27 (SUTURE) ×4
SUT VIC AB 3-0 SH 27X BRD (SUTURE) ×2 IMPLANT
SUT VIC AB 3-0 X1 27 (SUTURE) ×3 IMPLANT
SYR CONTROL 10ML LL (SYRINGE) IMPLANT
TOWEL GREEN STERILE (TOWEL DISPOSABLE) ×2 IMPLANT
WATER STERILE IRR 1000ML POUR (IV SOLUTION) ×2 IMPLANT

## 2021-03-13 NOTE — Op Note (Addendum)
Patient name: Helen Washington MRN: [id number] DOB: 12-11-1945 Sex: female  03/13/2021 Pre-operative Diagnosis: Asymptomatic left carotid stenosis Post-operative diagnosis:  Same Surgeon:  [name] Assistants:  Ivin Booty, PA Procedure:    left carotid Endarterectomy with bovine pericardial patch angioplasty Anesthesia:  General Blood Loss:  100 cc Specimens:  none  Findings:  80 %stenosis; Thrombus:  none  Indications:  Tisis a 76 year old female with asymptomatic left carotid stenosis.  She comes in today for endarterectomy.  Procedure:  The patient was identified in the holding area and taken to [place] [facility]  The patient was then placed supine on the table.   General endotrachial anesthesia was administered.  The patient was prepped and draped in the usual sterile fashion.  A time out was called and antibiotics were administered.  A PA was necessary to facilitate exposure by countertraction and suctioning.  A PA was also necessary to help with completion of the patch angioplasty by following sutures.  The incision was made along the anterior border of the left sternocleidomastoid muscle.  Cautery was used to dissect through the subcutaneous tissue.  The platysma muscle was divided with cautery.  The internal jugular vein was exposed along its anterior medial border.  The common facial vein was exposed and then divided between 2-0 silk ties and metal clips.  The common carotid artery was then circumferentially exposed and encircled with an umbilical tape.  The vagus nerve was identified and protected.  Next sharp dissection was used to expose the external carotid artery and the superior thyroid artery.  The were encircled with a blue vessel loop and a 2-0 silk tie respectively.  Finally, the internal carotid was carefully dissected free.  An umbilical tape was placed around the internal carotid artery distal to the diseased segment.  The hypoglossal nerve was visualized throughout and  protected.  The patient was given systemic heparinization.  A bovine carotid patch was selected and prepared on the back table.  A 10 french shunt was also prepared.  After blood pressure readings were appropriate and the heparin had been given time to circulate, the internal carotid artery was occluded with a baby Gregory clamp.  The external and common carotid arteries were then occluded with vascular clamps and the 2-0 tie tightened on the superior thyroid artery.  A #11 blade was used to make an arteriotomy in the common carotid artery.  This was extended with Potts scissors along the anterior and lateral border of the common and internal carotid artery.  Approximately 80% stenosis was identified.  There was no thrombus identified.  The 10 french shunt was not placed, as there was excellent backbleeding.  A kleiner kuntz elevator was used to perform endarterectomy.  An eversion endarterectomy was performed in the external carotid artery.  A good distal endpoint was obtained in the internal carotid artery.  The specimen was removed and sent to pathology.  Heparinized saline was used to irrigate the endarterectomized field.  All potential embolic debris was removed.  Bovine pericardial patch angioplasty was then performed using a running 6-0 Prolene.  The common internal and external carotid arteries were all appropriately flushed. The artery was again irrigated with heparin saline.  The anastomosis was then secured. The clamp was first released on the external carotid artery followed by the common carotid artery approximately 30 seconds later, bloodflow was reestablish through the internal carotid artery.  Next, a hand-held  Doppler was used to evaluate the signals in the  common, external, and internal  carotid arteries, all of which had appropriate signals. I then administered  50 mg protamine. The wound was then irrigated.  After hemostasis was achieved, the carotid sheath was reapproximated with 3-0 Vicryl.  The  platysma muscle was reapproximated with running 3-0 Vicryl. The skin  was closed with 4-0 Vicryl. Dermabond was placed on the skin. The  patient was then successfully extubated. His neurologic exam was  similar to his preprocedural exam. The patient was then taken to recovery room  in stable condition. There were no complications.     Disposition:  To PACU in stable condition.  Relevant Operative Details: 80% bifurcation stenosis.  The tail of the plaque extended up into the internal carotid artery for approximately 1.5 cm.  This was fully removed.  I tacked down the distal endpoint.  A bovine pericardial patch was placed.  I did not place a shunt as there was excellent backbleeding from the internal carotid artery.  Helen Washington, M.D., [facility] [place] Office: [phone] Pager:  [phone]

## 2021-03-13 NOTE — Transfer of Care (Signed)
Immediate Anesthesia Transfer of Care Note  Patient: Helen Washington  Procedure(s) Performed: LEFT CAROTID ENDARTERECTOMY (Left: Neck)  Patient Location: PACU  Anesthesia Type:General  Level of Consciousness: awake, alert  and oriented  Airway & Oxygen Therapy: Patient Spontanous Breathing and Patient connected to nasal cannula oxygen  Post-op Assessment: Report given to RN and Post -op Vital signs reviewed and stable  Post vital signs: Reviewed and stable  Last Vitals:  Vitals Value Taken Time  BP 98/58 03/13/21 1015  Temp 36.5 C 03/13/21 1015  Pulse 70 03/13/21 1021  Resp 15 03/13/21 1021  SpO2 94 % 03/13/21 1021  Vitals shown include unvalidated device data.  Last Pain:  Vitals:   03/13/21 0631  TempSrc:   PainSc: 0-No pain         Complications: No notable events documented.

## 2021-03-13 NOTE — Anesthesia Postprocedure Evaluation (Signed)
Anesthesia Post Note  Patient: Helen Washington  Procedure(s) Performed: LEFT CAROTID ENDARTERECTOMY (Left: Neck)     Patient location during evaluation: PACU Anesthesia Type: General Level of consciousness: awake and alert Pain management: pain level controlled Vital Signs Assessment: post-procedure vital signs reviewed and stable Respiratory status: spontaneous breathing, nonlabored ventilation, respiratory function stable and patient connected to nasal cannula oxygen Cardiovascular status: blood pressure returned to baseline and stable Postop Assessment: no apparent nausea or vomiting Anesthetic complications: no   No notable events documented.  Last Vitals:  Vitals:   03/13/21 1215 03/13/21 1230  BP: (!) 108/50 (!) 117/54  Pulse: 67 68  Resp: 17 16  Temp:    SpO2: 93% 95%    Last Pain:  Vitals:   03/13/21 1215  TempSrc:   PainSc: 0-No pain                 Alexyia Guarino,W. EDMOND

## 2021-03-13 NOTE — Interval H&P Note (Signed)
History and Physical Interval Note:  03/13/2021 7:27 AM  Helen Washington  has presented today for surgery, with the diagnosis of left carotid stenosis.  The various methods of treatment have been discussed with the patient and family. After consideration of risks, benefits and other options for treatment, the patient has consented to  Procedure(s): LEFT CAROTID ENDARTERECTOMY (Left) as a surgical intervention.  The patient's history has been reviewed, patient examined, no change in status, stable for surgery.  I have reviewed the patient's chart and labs.  Questions were answered to the patient's satisfaction.     [name]

## 2021-03-13 NOTE — Anesthesia Procedure Notes (Signed)
Procedure Name: Intubation Date/Time: 03/13/2021 7:50 AM Performed by: Minerva Ends, CRNA Pre-anesthesia Checklist: Patient identified, Emergency Drugs available, Suction available and Patient being monitored Patient Re-evaluated:Patient Re-evaluated prior to induction Oxygen Delivery Method: Circle system utilized Preoxygenation: Pre-oxygenation with 100% oxygen Induction Type: IV induction Ventilation: Mask ventilation without difficulty Laryngoscope Size: Mac and 3 Grade View: Grade I Tube type: Oral Tube size: 7.0 mm Number of attempts: 1 Airway Equipment and Method: Stylet and Oral airway Placement Confirmation: ETT inserted through vocal cords under direct vision, positive ETCO2 and breath sounds checked- equal and bilateral Secured at: 21 cm Tube secured with: Tape Dental Injury: Teeth and Oropharynx as per pre-operative assessment

## 2021-03-13 NOTE — Progress Notes (Addendum)
[facility] [place]  Subjective  - Doing well over all.  She did vomit after eating and was given IV nausea Zofran.   Objective (!) 127/54 81 97.8 F (36.6 C) (Oral) 16 94%  Intake/Output Summary (Last 24 hours) at 03/13/2021 1630 Last data filed at 03/13/2021 1414 Gross per 24 hour  Intake 1800 ml  Output 350 ml  Net 1450 ml    Left neck incision without hematoma healing well  No tongue deviation, no facial droop Palpable radial pulses B  Assessment/Planning: Left CEA for asymptomatic carotid stenosis > 80 Stable post op disposition without neurologic deficits   Helen Washington 03/13/2021 4:30 PM --  Laboratory Lab Results: No results for input(s): WBC, HGB, HCT, PLT in the last 72 hours. BMET No results for input(s): NA, K, CL, CO2, GLUCOSE, BUN, CREATININE, CALCIUM in the last 72 hours.  COAG Lab Results  Component Value Date   INR 1.0 03/10/2021   INR 1.0 09/10/2019   No results found for: PTT  I agree with the above.  The patient is neurologically intact status post left carotid endarterectomy.  Helen Washington

## 2021-03-13 NOTE — Progress Notes (Signed)
Patient's A-line was noted to be draining and oozing blood after patient got out of bed to [facility].  PA was paged and instructions were received to discontinue A-line.  A-line was discontinued without difficulty.  Will continue to monitor.

## 2021-03-13 NOTE — Progress Notes (Addendum)
PHARMACY NOTE:  ANTIMICROBIAL RENAL DOSAGE ADJUSTMENT  Current antimicrobial regimen includes a mismatch between antimicrobial dosage and estimated renal function.  As per policy approved by the Pharmacy & Therapeutics and Medical Executive Committees, the antimicrobial dosage will be adjusted accordingly.  Current antimicrobial dosage:  Cefazolin 2gm IV q8h x 2 doses and Bactrim DS 1 tablet BID (continued from PTA)  Indication: post-op prophylaxis and UTI  Renal Function: Estimated Creatinine Clearance: 26.5 mL/min (A) (by C-G formula based on SCr of 1.34 mg/dL (H)).  Antimicrobial dosage has been changed to:  Cefazolin 2gm IV q12h x 1 dose (to cover 24 hr post-op) and Bactrim SS 1 tablet q12h x 6 more days   Thank you for allowing pharmacy to be a part of this patient's care.  Sherlon Handing, PharmD, BCPS Please see amion for complete clinical pharmacist phone list 03/13/2021 1:36 PM

## 2021-03-13 NOTE — Discharge Instructions (Signed)
   [facility]  Discharge Instructions   Carotid Surgery  Please refer to the following instructions for your post-procedure care. Your surgeon or physician assistant will discuss any changes with you.  Activity  You are encouraged to walk as much as you can. You can slowly return to normal activities but must avoid strenuous activity and heavy lifting until your doctor tell you it's okay. Avoid activities such as vacuuming or swinging a golf club. You can drive after one week if you are comfortable and you are no longer taking prescription pain medications. It is normal to feel tired for serval weeks after your surgery. It is also normal to have difficulty with sleep habits, eating, and bowel movements after surgery. These will go away with time.  Bathing/Showering  Shower daily after you go home. Do not soak in a bathtub, hot tub, or swim until the incision heals completely.  Incision Care  Shower every day. Clean your incision with mild soap and water. Pat the area dry with a clean towel. You do not need a bandage unless otherwise instructed. Do not apply any ointments or creams to your incision. You may have skin glue on your incision. Do not peel it off. It will come off on its own in about one week. Your incision may feel thickened and raised for several weeks after your surgery. This is normal and the skin will soften over time.   For Men Only: It's okay to shave around the incision but do not shave the incision itself for 2 weeks. It is common to have numbness under your chin that could last for several months.  Diet  Resume your normal diet. There are no special food restrictions following this procedure. A low fat/low cholesterol diet is recommended for all patients with vascular disease. In order to heal from your surgery, it is CRITICAL to get adequate nutrition. Your body requires vitamins, minerals, and protein. Vegetables are the best source of  vitamins and minerals. Vegetables also provide the perfect balance of protein. Processed food has little nutritional value, so try to avoid this.  Medications  Resume taking all of your medications unless your doctor or physician assistant tells you not to. If your incision is causing pain, you may take over-the- counter pain relievers such as acetaminophen (Tylenol). If you were prescribed a stronger pain medication, please be aware these medications can cause nausea and constipation. Prevent nausea by taking the medication with a snack or meal. Avoid constipation by drinking plenty of fluids and eating foods with a high amount of fiber, such as fruits, vegetables, and grains.   Do not take Tylenol if you are taking prescription pain medications.  Follow Up  Our office will schedule a follow up appointment 2-3 weeks following discharge.  Please call us immediately for any of the following conditions  . Increased pain, redness, drainage (pus) from your incision site. . Fever of 101 degrees or higher. . If you should develop stroke (slurred speech, difficulty swallowing, weakness on one side of your body, loss of vision) you should call 911 and go to the nearest emergency room. .  Reduce your risk of vascular disease:  . Stop smoking. If you would like help call [facility] at 1-800-QUIT-NOW (1-800-784-8669) or [place] at 336-586-4000. . Manage your cholesterol . Maintain a desired weight . Control your diabetes . Keep your blood pressure down .  If you have any questions, please call the office at [phone]. 

## 2021-03-13 NOTE — Anesthesia Procedure Notes (Addendum)
Arterial Line Insertion Start/End2/24/2023 7:00 AM, 03/13/2021 7:10 AM Performed by: Minerva Ends, CRNA, CRNA  Patient location: Pre-op. Preanesthetic checklist: patient identified, IV checked, site marked, risks and benefits discussed, surgical consent, monitors and equipment checked, pre-op evaluation, timeout performed and anesthesia consent Lidocaine 1% used for infiltration Left, radial was placed Catheter size: 20 G Hand hygiene performed  and maximum sterile barriers used   Attempts: 1 Procedure performed without using ultrasound guided technique. Following insertion, dressing applied and Biopatch. Post procedure assessment: normal and unchanged  Patient tolerated the procedure well with no immediate complications.

## 2021-03-13 NOTE — Progress Notes (Signed)
Patient declined CPAP. No unit in room at this time. °

## 2021-03-14 LAB — CBC
HCT: 26.3 % — ABNORMAL LOW (ref 36.0–46.0)
Hemoglobin: 9 g/dL — ABNORMAL LOW (ref 12.0–15.0)
MCH: 32.7 pg (ref 26.0–34.0)
MCHC: 34.2 g/dL (ref 30.0–36.0)
MCV: 95.6 fL (ref 80.0–100.0)
Platelets: 170 10*3/uL (ref 150–400)
RBC: 2.75 MIL/uL — ABNORMAL LOW (ref 3.87–5.11)
RDW: 13.2 % (ref 11.5–15.5)
WBC: 8.1 10*3/uL (ref 4.0–10.5)
nRBC: 0 % (ref 0.0–0.2)

## 2021-03-14 LAB — BASIC METABOLIC PANEL
Anion gap: 9 (ref 5–15)
BUN: 20 mg/dL (ref 8–23)
CO2: 20 mmol/L — ABNORMAL LOW (ref 22–32)
Calcium: 8.7 mg/dL — ABNORMAL LOW (ref 8.9–10.3)
Chloride: 107 mmol/L (ref 98–111)
Creatinine, Ser: 1.5 mg/dL — ABNORMAL HIGH (ref 0.44–1.00)
GFR, Estimated: 36 mL/min — ABNORMAL LOW (ref >60)
Glucose, Bld: 110 mg/dL — ABNORMAL HIGH (ref 70–99)
Potassium: 4.4 mmol/L (ref 3.5–5.1)
Sodium: 136 mmol/L (ref 135–145)

## 2021-03-14 LAB — LIPID PANEL
Cholesterol: 100 mg/dL (ref 0–200)
HDL: 55 mg/dL (ref >40)
LDL Cholesterol: 33 mg/dL (ref 0–99)
Total CHOL/HDL Ratio: 1.8 RATIO
Triglycerides: 59 mg/dL (ref <150)
VLDL: 12 mg/dL (ref 0–40)

## 2021-03-14 MED ORDER — TRAMADOL HCL 50 MG PO TABS: 50.0000 mg | ORAL_TABLET | Freq: Four times a day (QID) | ORAL | 0 refills | Status: DC | PRN

## 2021-03-14 NOTE — Progress Notes (Addendum)
[facility]  Subjective  - Doing well.  One episode of Nausea with vomiting yesterday.     Objective 108/69 67 98.2 F (36.8 C) (Oral) 18 99%  Intake/Output Summary (Last 24 hours) at 03/14/2021 0910 Last data filed at 03/14/2021 0431 Gross per 24 hour  Intake 2968.69 ml  Output 550 ml  Net 2418.69 ml    Left neck incision healing well No neurologic deficits no tongue deviation and no facial droop Lungs non labored breathing  Assessment/Planning: POD # 1 left CEA for asymptomatic carotid stenosis > 80 %  Cr elevated @ baseline around 1.5.  Urine OP good. No neurologic deficits voiding and tolerating PO's without N/V this am. Plan for discharge today in stable disposition  Hypotension asymptomatic will hold Cozar today.  Check BP at home. F/U in 2 weeks for incision check.  [name] 03/14/2021 9:10 AM --  Laboratory Lab Results: Recent Labs    03/13/21 1627 03/14/21 0357  WBC 7.3 8.1  HGB 9.7* 9.0*  HCT 28.8* 26.3*  PLT 167 170   BMET Recent Labs    03/13/21 1510 03/14/21 0357  NA  --  136  K  --  4.4  CL  --  107  CO2  --  20*  GLUCOSE  --  110*  BUN  --  20  CREATININE 0.72 1.50*  CALCIUM  --  8.7*    COAG Lab Results  Component Value Date   INR 1.0 03/10/2021   INR 1.0 09/10/2019   No results found for: PTT  I have seen and evaluated the patient. I agree with the PA note as documented above.  Postop day 1 status post left carotid endarterectomy for asymptomatic stenosis.  Neurologically intact this morning and neck incision looks good.  Blood pressure slightly soft this morning and will hold Cozaar.  Hopefully discharge this afternoon.  She has ambulated and feels good otherwise.  Discussed follow-up in 2 to 3 weeks for incision check.  Aspirin Plavix statin at discharge.  Marty Heck, MD [facility] Office: [phone]

## 2021-03-14 NOTE — Progress Notes (Addendum)
PHARMACIST LIPID MONITORING   Helen Washington is a 76 y.o. female admitted on 03/13/2021 with left carotid stenosis.  Pharmacy has been consulted to optimize lipid-lowering therapy with the indication of secondary prevention for clinical ASCVD.  Recent Labs:  Lipid Panel (last 6 months):   Lab Results  Component Value Date   CHOL 100 03/14/2021   TRIG 59 03/14/2021   HDL 55 03/14/2021   CHOLHDL 1.8 03/14/2021   VLDL 12 03/14/2021   LDLCALC 33 03/14/2021    Hepatic function panel (last 6 months):   Lab Results  Component Value Date   AST 25 03/10/2021   ALT 16 03/10/2021   ALKPHOS 60 03/10/2021   BILITOT 0.6 03/10/2021    SCr (since admission):   Serum creatinine: 1.5 mg/dL (H) 03/14/21 0357 Estimated creatinine clearance: 23.7 mL/min (A)  Current therapy and lipid therapy tolerance Current lipid-lowering therapy: Crestor 40 mg QD, Zetia 10 mg QD Previous lipid-lowering therapies (if applicable): na Documented or reported allergies or intolerances to lipid-lowering therapies (if applicable): na  Assessment:   Patient is on optimal inpatient regimen, however Crestor dose reduction would be recommended based on renal function. Current CrCl is elevated from baseline, however losartan is being held. Will opt to continue Crestor at current dose and monitor renal function at this time. Plan to change to atorvastatin 80 mg daily if AKI does not resolve.  Plan:    1.Statin intensity (high intensity recommended for all patients regardless of the LDL):  No statin changes. The patient is already on a high intensity statin.  2.Add ezetimibe (if any one of the following):   Already taking  3.Refer to lipid clinic:   No  4.Follow-up with:  Primary care provider - Prince Solian, MD  5.Follow-up labs after discharge:  No changes in lipid therapy, repeat a lipid panel in one year.      Thank you for allowing pharmacy to participate in this patient's care.  Reatha Harps, PharmD PGY1  Pharmacy Resident 03/14/2021 12:15 PM Check [email or web address] for unit specific pharmacy number

## 2021-03-14 NOTE — Progress Notes (Signed)
Nursing dc noted  Patient alert and oriented, verbalized understanding of dc instructions. Patient denies chest pain or sob during ambulation. Vss. Room air sats 91-93 percent. Ccmd notified of dc order. All belongings given to patient. Daughter andrea on the way to pick up patient.

## 2021-03-16 ENCOUNTER — Encounter (HOSPITAL_COMMUNITY): Payer: Self-pay | Admitting: Surgery

## 2021-03-16 LAB — POCT ACTIVATED CLOTTING TIME
Activated Clotting Time: 275 seconds
Activated Clotting Time: 143 seconds

## 2021-03-16 NOTE — Discharge Summary (Signed)
[facility] Discharge Summary   Patient ID:  Sonnie Bias MRN: [id number] DOB/AGE: Oct 27, 1945 76 y.o.  Admit date: 03/13/2021 Discharge date: 03/14/21 Date of Surgery: 03/13/2021 Surgeon: Surgeon(s): Serafina Mitchell, MD  Admission Diagnosis: Carotid stenosis, asymptomatic, left [I65.22]  Discharge Diagnoses:  Carotid stenosis, asymptomatic, left [I65.22]  Secondary Diagnoses: Past Medical History:  Diagnosis Date   Arthritis    Complication of anesthesia    woke up during colonoscopy   Coronary artery disease    Hyperlipidemia    Hypertension    Insomnia    Sleep apnea     Procedure(s): LEFT CAROTID ENDARTERECTOMY  Discharged Condition: good  HPI: Guiselle Mian is a 76 y.o. female who was found to have asymptomatic carotid stenosis > 80% left ICA.  She was scheduled for left CEA.   Hospital Course:  Almarie Kurdziel is a 76 y.o. female is S/P  Procedure(s): LEFT CAROTID ENDARTERECTOMY Stable post op disposition without neurologic deficits.  Ambulating, tol PO's and voided independently.  She had 1 post op bout of nausea with vomiting.  This subsided with Zofran.  She was discharged in stable condition post op day 1.  F/U in 2-3 weeks for incision check.     Significant Diagnostic Studies: CBC Lab Results  Component Value Date   WBC 8.1 03/14/2021   HGB 9.0 (L) 03/14/2021   HCT 26.3 (L) 03/14/2021   MCV 95.6 03/14/2021   PLT 170 03/14/2021    BMET    Component Value Date/Time   NA 136 03/14/2021 0357   NA 141 01/14/2021 0825   K 4.4 03/14/2021 0357   CL 107 03/14/2021 0357   CO2 20 (L) 03/14/2021 0357   GLUCOSE 110 (H) 03/14/2021 0357   BUN 20 03/14/2021 0357   BUN 18 02/10/2021 1243   CREATININE 1.50 (H) 03/14/2021 0357   CALCIUM 8.7 (L) 03/14/2021 0357   GFRNONAA 36 (L) 03/14/2021 0357   GFRAA >60 09/14/2019 0229   COAG Lab Results  Component Value Date   INR 1.0 03/10/2021   INR 1.0 09/10/2019     Disposition:  Discharge to  :Home Discharge Instructions     Call MD for:  redness, tenderness, or signs of infection (pain, swelling, bleeding, redness, odor or green/yellow discharge around incision site)   Complete by: As directed    Call MD for:  severe or increased pain, loss or decreased feeling  in affected limb(s)   Complete by: As directed    Call MD for:  temperature >100.5   Complete by: As directed    Resume previous diet   Complete by: As directed       Allergies as of 03/14/2021       Reactions   Codeine Nausea Only        Medication List     TAKE these medications    alendronate 70 MG tablet Commonly known as: FOSAMAX Take 70 mg by mouth every Monday. Take with a full glass of water on an empty stomach.   aspirin EC 81 MG tablet Take 1 tablet (81 mg total) by mouth daily.   carvedilol 3.125 MG tablet Commonly known as: COREG TAKE 1 TABLET (3.125 MG TOTAL) BY MOUTH 2 (TWO) TIMES DAILY.   clopidogrel 75 MG tablet Commonly known as: Plavix Take 1 tablet (75 mg total) by mouth daily.   empagliflozin 10 MG Tabs tablet Commonly known as: Jardiance Take 1 tablet (10 mg total) by mouth daily before breakfast.   ezetimibe 10  MG tablet Commonly known as: ZETIA Take 1 tablet (10 mg total) by mouth daily.   losartan 25 MG tablet Commonly known as: COZAAR TAKE 1 TABLET BY MOUTH EVERY DAY   rosuvastatin 40 MG tablet Commonly known as: CRESTOR Take 1 tablet (40 mg total) by mouth daily.   sulfamethoxazole-trimethoprim 800-160 MG tablet Commonly known as: BACTRIM DS Take 1 tablet by mouth 2 (two) times daily.   traMADol 50 MG tablet Commonly known as: ULTRAM Take 1 tablet (50 mg total) by mouth every 6 (six) hours as needed for moderate pain.   vitamin D3 50 MCG (2000 UT) Caps Take 2,000 Units by mouth daily.       Verbal and written Discharge instructions given to the patient. Wound care per Discharge AVS  Follow-up Information     Serafina Mitchell, MD Follow up in 2  week(s).   Specialties: Vascular Surgery, Cardiology Why: Office will call you to arrange your appt (sent) Contact information: [place] [address] [phone]                 Signed: [name] 03/16/2021, 9:56 AM  --- For VQI Registry use --- Instructions: Press F2 to tab through selections.  Delete question if not applicable.   Modified Rankin score at D/C (0-6): Rankin Score=0  IV medication needed for:  1. Hypertension: No 2. Hypotension: No  Post-op Complications: No  1. Post-op CVA or TIA: No  If yes: Event classification (right eye, left eye, right cortical, left cortical, verterobasilar, other):   If yes: Timing of event (intra-op, <6 hrs post-op, >=6 hrs post-op, unknown):   2. CN injury: No  If yes: CN  injuried   3. Myocardial infarction: No  If yes: Dx by (EKG or clinical, Troponin):   4.  CHF: No  5.  Dysrhythmia (new): No  6. Wound infection: No  7. Reperfusion symptoms: No  8. Return to OR: No  If yes: return to OR for (bleeding, neurologic, other CEA incision, other):   Discharge medications: Statin use:  Yes ASA use:  Yes Beta blocker use:  Yes ACE-Inhibitor use:  No  for medical reason not indicated P2Y12 Antagonist use: [ ]  None, [ x] Plavix, [ ]  Plasugrel, [ ]  Ticlopinine, [ ]  Ticagrelor, [ ]  Other, [ ]  No for medical reason, [ ]  Non-compliant, [ ]  Not-indicated Anti-coagulant use:  [ x] None, [ ]  Warfarin, [ ]  Rivaroxaban, [ ]  Dabigatran, [ ]  Other, [ ]  No for medical reason, [ ]  Non-compliant, [ ]  Not-indicated

## 2021-03-25 DIAGNOSIS — G4733 Obstructive sleep apnea (adult) (pediatric): Secondary | ICD-10-CM | POA: Diagnosis not present

## 2021-04-06 ENCOUNTER — Encounter: Payer: Self-pay | Admitting: Surgery

## 2021-04-06 ENCOUNTER — Other Ambulatory Visit: Payer: Self-pay

## 2021-04-06 ENCOUNTER — Ambulatory Visit (INDEPENDENT_AMBULATORY_CARE_PROVIDER_SITE_OTHER): Payer: Medicare HMO | Admitting: Surgery

## 2021-04-06 VITALS — BP 146/57 | HR 56 | Temp 97.6°F | Resp 20 | Ht 59.0 in | Wt 115.0 lb

## 2021-04-06 DIAGNOSIS — I70213 Atherosclerosis of native arteries of extremities with intermittent claudication, bilateral legs: Secondary | ICD-10-CM

## 2021-04-06 DIAGNOSIS — I6522 Occlusion and stenosis of left carotid artery: Secondary | ICD-10-CM

## 2021-04-06 NOTE — Progress Notes (Signed)
? ?Patient name: Helen Washington MRN: [id number] DOB: 29-Jun-1945 Sex: female ? ?REASON FOR VISIT:  ? ? ? Post op ? ?HISTORY OF PRESENT ILLNESS:  ? ?Helen Washington is a 76 y.o. female who suffers from bilateral claudication.  On 09/13/2019, she underwent bilateral femoral endarterectomy with patch angioplasty using a bovine patch.  Simultaneously she had a left external iliac stent placed.  She had recurrence of her symptoms.  She subsequently underwent stenting of her right superficial femoral artery on 08/26/2020 for a 90% right superficial femoral artery lesion and a 90% popliteal lesion.  She has a short segment left popliteal occlusion which has not been addressed. ? ?She was found to have greater than 80% left carotid stenosis which was asymptomatic.  She underwent left carotid endarterectomy with bovine pericardial patch angioplasty on 03/13/2021 for asymptomatic left carotid stenosis.  Intraoperative findings included a heavily calcified 80% stenosis. ?  ?The patient suffers from white coat hypertension.  she takes a statin for hypercholesterolemia.  The patient is a former smoker ? ?CURRENT MEDICATIONS:  ? ? ?Current Outpatient Medications  ?Medication Sig Dispense Refill  ? alendronate (FOSAMAX) 70 MG tablet Take 70 mg by mouth every Monday. Take with a full glass of water on an empty stomach.    ? aspirin EC 81 MG tablet Take 1 tablet (81 mg total) by mouth daily. 90 tablet 3  ? Cholecalciferol (VITAMIN D3) 50 MCG (2000 UT) CAPS Take 2,000 Units by mouth daily.    ? clopidogrel (PLAVIX) 75 MG tablet Take 1 tablet (75 mg total) by mouth daily. 30 tablet 11  ? empagliflozin (JARDIANCE) 10 MG TABS tablet Take 1 tablet (10 mg total) by mouth daily before breakfast. 30 tablet 3  ? ezetimibe (ZETIA) 10 MG tablet Take 1 tablet (10 mg total) by mouth daily. 90 tablet 3  ? losartan (COZAAR) 25 MG tablet TAKE 1 TABLET BY MOUTH EVERY DAY 90 tablet 1  ? rosuvastatin (CRESTOR) 40 MG tablet Take 1  tablet (40 mg total) by mouth daily. 90 tablet 3  ? traMADol (ULTRAM) 50 MG tablet Take 1 tablet (50 mg total) by mouth every 6 (six) hours as needed for moderate pain. 15 tablet 0  ? carvedilol (COREG) 3.125 MG tablet TAKE 1 TABLET (3.125 MG TOTAL) BY MOUTH 2 (TWO) TIMES DAILY. 180 tablet 3  ? ?No current facility-administered medications for this visit.  ? ? ?REVIEW OF SYSTEMS:  ? ?'[X]'$  denotes positive finding, '[ ]'$  denotes negative finding ?Cardiac  Comments:  ?Chest pain or chest pressure:    ?Shortness of breath upon exertion:    ?Short of breath when lying flat:    ?Irregular heart rhythm:    ?Constitutional    ?Fever or chills:    ? ? ?PHYSICAL EXAM:  ? ?Vitals:  ? 04/06/21 0918 04/06/21 0920  ?BP: 140/63 (!) 146/57  ?Pulse: (!) 56   ?Resp: 20   ?Temp: 97.6 ?F (36.4 ?C)   ?SpO2: 98%   ?Weight: 115 lb (52.2 kg)   ?Height: '4\' 11"'$  (1.499 m)   ? ? ?GENERAL: The patient is a well-nourished female, in no acute distress. The vital signs are documented above. ?CARDIOVASCULAR: There is a regular rate and rhythm. ?PULMONARY: Non-labored respirations ?Left carotid incision is well-healed ? ?STUDIES:  ? ? ? ? ? ?MEDICAL ISSUES:  ? ?Carotid: Status post left carotid endarterectomy.  She remains neurologically intact.  I will repeat her ultrasound in 6 months. ? ?PAD: She still has some left leg  claudication symptoms.  She has a known short segment popliteal occlusion.  I discussed trying to tolerate her disability and if she cannot, she would be a candidate for an attempt at percutaneous revascularization via a right femoral approach.  She is going to think about this.  She will follow-up in 6 months unless she decides to proceed with angiography before then.  I would not need to see her back prior to scheduling an angio. ? ?[name], IV, MD, FACS ?[facility] ?Tel [phone] ?Pager [phone] ? ? ?  ?

## 2021-04-10 ENCOUNTER — Other Ambulatory Visit: Payer: Self-pay | Admitting: *Deleted

## 2021-04-10 DIAGNOSIS — I6523 Occlusion and stenosis of bilateral carotid arteries: Secondary | ICD-10-CM

## 2021-04-10 DIAGNOSIS — I70213 Atherosclerosis of native arteries of extremities with intermittent claudication, bilateral legs: Secondary | ICD-10-CM

## 2021-04-10 DIAGNOSIS — I739 Peripheral vascular disease, unspecified: Secondary | ICD-10-CM

## 2021-04-11 ENCOUNTER — Encounter: Payer: Self-pay | Admitting: Cardiology

## 2021-04-13 MED ORDER — EZETIMIBE 10 MG PO TABS: 10.0000 mg | ORAL_TABLET | Freq: Every day | ORAL | 3 refills | Status: DC

## 2021-04-21 ENCOUNTER — Other Ambulatory Visit: Payer: Self-pay | Admitting: Cardiology

## 2021-04-21 NOTE — Progress Notes (Signed)
?Cardiology Office Note:   ? ?Date:  04/22/2021  ? ?ID:  Helen Washington, DOB 07-Sep-1945, MRN [id number] ? ?PCP:  Prince Solian, MD  ?Cardiologist:  Donato Heinz, MD  ?Electrophysiologist:  None  ? ?Referring MD: Prince Solian, MD  ? ?Chief Complaint  ?Patient presents with  ? Coronary Artery Disease  ? ? ?History of Present Illness:   ? ?Helen Washington is a 76 y.o. female with a hx of hypertension, hyperlipidemia, PAD who presents for follow-up.  She was referred by Dr. Trula Slade for preop evaluation prior to bilateral femoral endarterectomy.  She was seen by Dr. Trula Slade for evaluation of claudication.  Angiogram on 05/15/2019 showed bilateral femoral artery occlusion, bilateral near occlusive popliteal stenosis.  Reports she has pain in her legs with minimal exertion, which limits her activity.  Up until 1 month ago she was doing water aerobics 3 times per week.  Would be in the pool for 3 hours.  She denies any exertional chest pain or dyspnea.  She quit smoking in 2002, smoked 1 pack/day x 45 years.  Denies any history of heart disease in her immediate family. ? ?Coronary CTA on 06/30/2019 showed calcium score 1503 (97 percentile).  CT FFR showed severe stenosis in small nondominant circumflex artery (was read as occluded mid LAD and mid RCA on FFR but reviewed with Dr Meda Coffee and appears to not be occluded but rather not modeled on FFR due to small vessel size <1.17m.  Lexiscan Myoview on 08/24/2019 showed large perfusion defect in basal to apical inferior/inferoseptal, minimal reversibility, EF 42%.  Echocardiogram on 08/29/2019 showed LVEF 45 to 557% grade 1 diastolic dysfunction, RVSP 43 mmHg, moderate MR.  Echocardiogram 11/04/2020 showed EF 45 to 50%, normal RV function, moderate to severe MR, RVSP 41 mmHg.  Cardiac MRI on 12/16/2020 showed subendocardial LGE consistent with prior infarcts in basal to apical inferior, mid inferolateral, and apex with nonviable mid inferolateral, apical inferior, and apex;  EF 41%, normal RV, mild to moderate MR (regurgitant fraction 18%).  Cath on 12/30/2020 showed multivessel CAD (occluded mid RCA, 50% mid LAD, 70% OM, 20% proximal LCx), EF 45%, LVEDP 16 mmHg.  Medical therapy recommended. ? ?Since last clinic visit, she reports has been doing well.  Denies any chest pain, dyspnea, lightheadedness, syncope, lower extremity edema, or palpitations.  Does water aerobics 3 times per week for 2 hours, no exertional symptoms.  No bleeding issues on DAPT. ? ?BP Readings from Last 3 Encounters:  ?04/22/21 120/68  ?04/06/21 (!) 146/57  ?03/14/21 (!) 123/52  ? ? ?Past Medical History:  ?Diagnosis Date  ? Arthritis   ? Complication of anesthesia   ? woke up during colonoscopy  ? Coronary artery disease   ? Hyperlipidemia   ? Hypertension   ? Insomnia   ? Sleep apnea   ? ? ?Past Surgical History:  ?Procedure Laterality Date  ? ABDOMINAL AORTOGRAM W/LOWER EXTREMITY Bilateral 05/15/2019  ? Procedure: ABDOMINAL AORTOGRAM W/LOWER EXTREMITY;  Surgeon: BSerafina Mitchell MD;  Location: [facility];  Service: Cardiovascular;  Laterality: Bilateral;  ? ABDOMINAL AORTOGRAM W/LOWER EXTREMITY N/A 08/26/2020  ? Procedure: ABDOMINAL AORTOGRAM W/LOWER EXTREMITY;  Surgeon: BSerafina Mitchell MD;  [facility];  Service: Cardiovascular;  Laterality: N/A;  ? CATARACT EXTRACTION, BILATERAL  12/2015;01/2016  ? [facility]  ? COLONOSCOPY    ? ENDARTERECTOMY Left 03/13/2021  ? Procedure: LEFT CAROTID ENDARTERECTOMY;  Surgeon: BSerafina Mitchell MD;  Location[facility]  Service:  Vascular;  Laterality: Left;  ? ENDARTERECTOMY FEMORAL Bilateral 09/13/2019  ? Procedure: BILATERAL FEMORAL ENDARTERECTOMY with Patch Angioplasty;  Surgeon: Serafina Mitchell, MD;  Location: [facility];  Service: Vascular;  Laterality: Bilateral;  ? INSERTION OF ILIAC STENT Left 09/13/2019  ? Procedure: INSERTION OF LEFT ILIAC STENT;  Surgeon: Serafina Mitchell, MD;  Location: [facility];  Service: Vascular;  Laterality:  Left;  ? LEFT HEART CATH AND CORONARY ANGIOGRAPHY N/A 12/30/2020  ? Procedure: LEFT HEART CATH AND CORONARY ANGIOGRAPHY;  Surgeon: Troy Sine, MD;  Location: [facility];  Service: Cardiovascular;  Laterality: N/A;  ? PATCH ANGIOPLASTY Bilateral 09/13/2019  ? Procedure: PATCH ANGIOPLASTY Bilateral;  Surgeon: Serafina Mitchell, MD;  Location: [facility];  Service: Vascular;  Laterality: Bilateral;  ? PERIPHERAL VASCULAR INTERVENTION Right 08/26/2020  ? Procedure: PERIPHERAL VASCULAR INTERVENTION;  Surgeon: Serafina Mitchell, MD;  Location: [facility];  Service: Cardiovascular;  Laterality: Right;  femoral popliteal artery  ? TUBAL LIGATION  1969  ? ? ?Current Medications: ?Current Meds  ?Medication Sig  ? alendronate (FOSAMAX) 70 MG tablet Take 70 mg by mouth every Monday. Take with a full glass of water on an empty stomach.  ? aspirin EC 81 MG tablet Take 1 tablet (81 mg total) by mouth daily.  ? carvedilol (COREG) 3.125 MG tablet TAKE 1 TABLET (3.125 MG TOTAL) BY MOUTH 2 (TWO) TIMES DAILY.  ? Cholecalciferol (VITAMIN D3) 50 MCG (2000 UT) CAPS Take 2,000 Units by mouth daily.  ? clopidogrel (PLAVIX) 75 MG tablet Take 1 tablet (75 mg total) by mouth daily.  ? empagliflozin (JARDIANCE) 10 MG TABS tablet TAKE 1 TABLET BY MOUTH DAILY BEFORE BREAKFAST.  ? ezetimibe (ZETIA) 10 MG tablet Take 1 tablet (10 mg total) by mouth daily.  ? losartan (COZAAR) 25 MG tablet TAKE 1 TABLET BY MOUTH EVERY DAY  ? rosuvastatin (CRESTOR) 40 MG tablet Take 1 tablet (40 mg total) by mouth daily.  ?  ? ?Allergies:   Codeine  ? ?Social History  ? ?Socioeconomic History  ? Marital status: Divorced  ?  Spouse name: Not on file  ? Number of children: 1  ? Years of education: 11  ? Highest education level: Not on file  ?Occupational History  ?  Comment: retired Scientist, clinical (histocompatibility and immunogenetics), typist  ?Tobacco Use  ? Smoking status: Former  ?  Packs/day: 1.00  ?  Years: 36.00  ?  Pack years: 36.00  ?  Types: Cigarettes  ?  Quit date: 04/01/2000  ?  Years since  quitting: 21.0  ? Smokeless tobacco: Never  ?Vaping Use  ? Vaping Use: Never used  ?Substance and Sexual Activity  ? Alcohol use: Yes  ?  Comment: 1 glass wine per week  ? Drug use: No  ? Sexual activity: Not on file  ?Other Topics Concern  ? Not on file  ?Social History Narrative  ? Lives alone  ? Caffeine - coffee, 1 cup daily; maybe 4 teas per week  ? Right handed  ? ?Social Determinants of Health  ? ?Financial Resource Strain: Not on file  ?Food Insecurity: Not on file  ?Transportation Needs: Not on file  ?Physical Activity: Not on file  ?Stress: Not on file  ?Social Connections: Not on file  ?  ? ?Family History: ?The patient's family history includes Anxiety disorder in her brother; Arthritis in her mother; Breast cancer in her cousin; CVA in her mother; Cancer in her father; Diverticulitis in her mother; Diverticulosis  in her sister; Healthy in her daughter; Hypertension in her brother and sister; Kidney Stones in her brother; Stroke in her mother; Suicidality in her brother; Ulcers in her brother. ? ?ROS:   ?Please see the history of present illness.    ? ? All other systems reviewed and are negative. ? ?EKGs/Labs/Other Studies Reviewed:   ? ?The following studies were reviewed today: ? ? ?EKG:   ?06/06/2019: sinus rhythm, PVC, nonspecific T wave flattening, less than 1 mm ST depression in V6 ?07/13/2019: EKG was not ordered. ?08/07/2019: EKG was not ordered. ?05/14/2020: Sinus rhythm. Rate: 55 bpm. Q waves in V1/V2. Less than 1 mm ST depression in leads I, II, V5 and V6.  ?11/14/20: Sinus rhythm, rate 62, Q waves in V1/2, T wave inversions in leads III, aVF, less than 1 mm ST depression in leads V5/6 ? ?Recent Labs: ?01/14/2021: Magnesium 2.4 ?03/10/2021: ALT 16 ?03/14/2021: BUN 20; Creatinine, Ser 1.50; Hemoglobin 9.0; Platelets 170; Potassium 4.4; Sodium 136  ?Recent Lipid Panel ?   ?Component Value Date/Time  ? CHOL 100 03/14/2021 0357  ? CHOL 178 01/14/2021 0825  ? TRIG 59 03/14/2021 0357  ? HDL 55 03/14/2021  0357  ? HDL 86 01/14/2021 0825  ? CHOLHDL 1.8 03/14/2021 0357  ? VLDL 12 03/14/2021 0357  ? [place] 33 03/14/2021 0357  ? [place] 78 01/14/2021 0825  ? ? ?Physical Exam:   ? ?VS:  BP 120/68   Pulse [name])

## 2021-04-22 ENCOUNTER — Encounter: Payer: Self-pay | Admitting: Cardiology

## 2021-04-22 ENCOUNTER — Ambulatory Visit: Payer: Medicare HMO | Admitting: Cardiology

## 2021-04-22 VITALS — BP 120/68 | HR 52 | Ht 59.0 in | Wt 114.8 lb

## 2021-04-22 DIAGNOSIS — E785 Hyperlipidemia, unspecified: Secondary | ICD-10-CM

## 2021-04-22 DIAGNOSIS — I5022 Chronic systolic (congestive) heart failure: Secondary | ICD-10-CM | POA: Diagnosis not present

## 2021-04-22 DIAGNOSIS — I10 Essential (primary) hypertension: Secondary | ICD-10-CM

## 2021-04-22 DIAGNOSIS — I2510 Atherosclerotic heart disease of native coronary artery without angina pectoris: Secondary | ICD-10-CM | POA: Diagnosis not present

## 2021-04-22 DIAGNOSIS — I739 Peripheral vascular disease, unspecified: Secondary | ICD-10-CM

## 2021-04-22 DIAGNOSIS — I340 Nonrheumatic mitral (valve) insufficiency: Secondary | ICD-10-CM

## 2021-04-22 LAB — BASIC METABOLIC PANEL
BUN/Creatinine Ratio: 13 (ref 12–28)
BUN: 17 mg/dL (ref 8–27)
CO2: 24 mmol/L (ref 20–29)
Calcium: 10.1 mg/dL (ref 8.7–10.3)
Chloride: 104 mmol/L (ref 96–106)
Creatinine, Ser: 1.27 mg/dL — ABNORMAL HIGH (ref 0.57–1.00)
Glucose: 86 mg/dL (ref 70–99)
Potassium: 5.1 mmol/L (ref 3.5–5.2)
Sodium: 138 mmol/L (ref 134–144)
eGFR: 44 mL/min/{1.73_m2} — ABNORMAL LOW (ref >59)

## 2021-04-22 LAB — MAGNESIUM: Magnesium: 2.4 mg/dL — ABNORMAL HIGH (ref 1.6–2.3)

## 2021-04-22 NOTE — Patient Instructions (Signed)
Medication Instructions:  ?Your physician recommends that you continue on your current medications as directed. Please refer to the Current Medication list given to you today. ? ?*If you need a refill on your cardiac medications before your next appointment, please call your pharmacy* ? ? ?Lab Work: ?BM[facility], [place] today ? ?If you have labs (blood work) drawn today and your tests are completely normal, you will receive your results only by: ?MyChart Message (if you have MyChart) OR ?A paper copy in the mail ?If you have any lab test that is abnormal or we need to change your treatment, we will call you to review the results. ? ? ?Follow-Up: ?At [facility], you and your health needs are our priority.  As part of our continuing mission to provide you with exceptional heart care, we have created designated Provider Care Teams.  These Care Teams include your primary Cardiologist (physician) and Advanced Practice Providers (APPs -  Physician Assistants and Nurse Practitioners) who all work together to provide you with the care you need, when you need it. ? ?We recommend signing up for the patient portal called "MyChart".  Sign up information is provided on this After Visit Summary.  MyChart is used to connect with patients for Virtual Visits (Telemedicine).  Patients are able to view lab/test results, encounter notes, upcoming appointments, etc.  Non-urgent messages can be sent to your provider as well.   ?To learn more about what you can do with MyChart, go to [email or web address].   ? ?Your next appointment:   ?3 month(s) ? ?The format for your next appointment:   ?In Person ? ?Provider:   ?Sande Rives, PA-C, Caron Presume, PA-C, Jory Sims, DNP, ANP, Almyra Deforest, PA-C, or Diona Browner, NP    Then, Donato Heinz, MD will plan to see you again in 6 month(s).{ ? ? ?

## 2021-04-25 DIAGNOSIS — G4733 Obstructive sleep apnea (adult) (pediatric): Secondary | ICD-10-CM | POA: Diagnosis not present

## 2021-05-16 ENCOUNTER — Other Ambulatory Visit: Payer: Self-pay | Admitting: Cardiology

## 2021-05-25 DIAGNOSIS — G4733 Obstructive sleep apnea (adult) (pediatric): Secondary | ICD-10-CM | POA: Diagnosis not present

## 2021-06-06 ENCOUNTER — Other Ambulatory Visit: Payer: Self-pay | Admitting: Surgery

## 2021-06-06 ENCOUNTER — Other Ambulatory Visit: Payer: Self-pay | Admitting: Cardiology

## 2021-06-09 DIAGNOSIS — H04123 Dry eye syndrome of bilateral lacrimal glands: Secondary | ICD-10-CM | POA: Diagnosis not present

## 2021-06-09 DIAGNOSIS — H35372 Puckering of macula, left eye: Secondary | ICD-10-CM | POA: Diagnosis not present

## 2021-06-09 DIAGNOSIS — H26491 Other secondary cataract, right eye: Secondary | ICD-10-CM | POA: Diagnosis not present

## 2021-06-09 DIAGNOSIS — H5213 Myopia, bilateral: Secondary | ICD-10-CM | POA: Diagnosis not present

## 2021-06-26 DIAGNOSIS — E785 Hyperlipidemia, unspecified: Secondary | ICD-10-CM | POA: Diagnosis not present

## 2021-06-26 DIAGNOSIS — Z7983 Long term (current) use of bisphosphonates: Secondary | ICD-10-CM | POA: Diagnosis not present

## 2021-06-26 DIAGNOSIS — M1990 Unspecified osteoarthritis, unspecified site: Secondary | ICD-10-CM | POA: Diagnosis not present

## 2021-06-26 DIAGNOSIS — I2510 Atherosclerotic heart disease of native coronary artery without angina pectoris: Secondary | ICD-10-CM | POA: Diagnosis not present

## 2021-06-26 DIAGNOSIS — I10 Essential (primary) hypertension: Secondary | ICD-10-CM | POA: Diagnosis not present

## 2021-06-26 DIAGNOSIS — I951 Orthostatic hypotension: Secondary | ICD-10-CM | POA: Diagnosis not present

## 2021-06-26 DIAGNOSIS — M8580 Other specified disorders of bone density and structure, unspecified site: Secondary | ICD-10-CM | POA: Diagnosis not present

## 2021-06-26 DIAGNOSIS — G4733 Obstructive sleep apnea (adult) (pediatric): Secondary | ICD-10-CM | POA: Diagnosis not present

## 2021-06-26 DIAGNOSIS — M810 Age-related osteoporosis without current pathological fracture: Secondary | ICD-10-CM | POA: Diagnosis not present

## 2021-06-26 DIAGNOSIS — Z7902 Long term (current) use of antithrombotics/antiplatelets: Secondary | ICD-10-CM | POA: Diagnosis not present

## 2021-06-26 DIAGNOSIS — I739 Peripheral vascular disease, unspecified: Secondary | ICD-10-CM | POA: Diagnosis not present

## 2021-06-26 DIAGNOSIS — Z7982 Long term (current) use of aspirin: Secondary | ICD-10-CM | POA: Diagnosis not present

## 2021-07-01 DIAGNOSIS — R7989 Other specified abnormal findings of blood chemistry: Secondary | ICD-10-CM | POA: Diagnosis not present

## 2021-07-01 DIAGNOSIS — E785 Hyperlipidemia, unspecified: Secondary | ICD-10-CM | POA: Diagnosis not present

## 2021-07-01 DIAGNOSIS — I10 Essential (primary) hypertension: Secondary | ICD-10-CM | POA: Diagnosis not present

## 2021-07-01 DIAGNOSIS — M810 Age-related osteoporosis without current pathological fracture: Secondary | ICD-10-CM | POA: Diagnosis not present

## 2021-07-08 DIAGNOSIS — Z0000 Encounter for general adult medical examination without abnormal findings: Secondary | ICD-10-CM | POA: Diagnosis not present

## 2021-07-14 DIAGNOSIS — M1990 Unspecified osteoarthritis, unspecified site: Secondary | ICD-10-CM | POA: Diagnosis not present

## 2021-07-14 DIAGNOSIS — I5022 Chronic systolic (congestive) heart failure: Secondary | ICD-10-CM | POA: Diagnosis not present

## 2021-07-14 DIAGNOSIS — I739 Peripheral vascular disease, unspecified: Secondary | ICD-10-CM | POA: Diagnosis not present

## 2021-07-14 DIAGNOSIS — R82998 Other abnormal findings in urine: Secondary | ICD-10-CM | POA: Diagnosis not present

## 2021-07-14 DIAGNOSIS — B351 Tinea unguium: Secondary | ICD-10-CM | POA: Diagnosis not present

## 2021-07-14 DIAGNOSIS — K5904 Chronic idiopathic constipation: Secondary | ICD-10-CM | POA: Diagnosis not present

## 2021-07-14 DIAGNOSIS — E785 Hyperlipidemia, unspecified: Secondary | ICD-10-CM | POA: Diagnosis not present

## 2021-07-14 DIAGNOSIS — Z1331 Encounter for screening for depression: Secondary | ICD-10-CM | POA: Diagnosis not present

## 2021-07-14 DIAGNOSIS — I700 Atherosclerosis of aorta: Secondary | ICD-10-CM | POA: Diagnosis not present

## 2021-07-14 DIAGNOSIS — M810 Age-related osteoporosis without current pathological fracture: Secondary | ICD-10-CM | POA: Diagnosis not present

## 2021-07-14 DIAGNOSIS — I10 Essential (primary) hypertension: Secondary | ICD-10-CM | POA: Diagnosis not present

## 2021-07-14 DIAGNOSIS — I2510 Atherosclerotic heart disease of native coronary artery without angina pectoris: Secondary | ICD-10-CM | POA: Diagnosis not present

## 2021-07-14 DIAGNOSIS — Z1339 Encounter for screening examination for other mental health and behavioral disorders: Secondary | ICD-10-CM | POA: Diagnosis not present

## 2021-07-14 DIAGNOSIS — Z0000 Encounter for general adult medical examination without abnormal findings: Secondary | ICD-10-CM | POA: Diagnosis not present

## 2021-07-24 ENCOUNTER — Ambulatory Visit: Payer: Medicare HMO | Admitting: Cardiology

## 2021-07-24 ENCOUNTER — Ambulatory Visit: Payer: Medicare HMO | Admitting: Physician Assistant

## 2021-07-24 ENCOUNTER — Encounter: Payer: Self-pay | Admitting: Physician Assistant

## 2021-07-24 VITALS — BP 104/58 | HR 56 | Ht 59.0 in | Wt 112.2 lb

## 2021-07-24 DIAGNOSIS — I10 Essential (primary) hypertension: Secondary | ICD-10-CM | POA: Diagnosis not present

## 2021-07-24 DIAGNOSIS — E785 Hyperlipidemia, unspecified: Secondary | ICD-10-CM

## 2021-07-24 DIAGNOSIS — R5383 Other fatigue: Secondary | ICD-10-CM

## 2021-07-24 DIAGNOSIS — I2510 Atherosclerotic heart disease of native coronary artery without angina pectoris: Secondary | ICD-10-CM

## 2021-07-24 DIAGNOSIS — I739 Peripheral vascular disease, unspecified: Secondary | ICD-10-CM | POA: Diagnosis not present

## 2021-07-24 DIAGNOSIS — I255 Ischemic cardiomyopathy: Secondary | ICD-10-CM

## 2021-07-24 DIAGNOSIS — I6522 Occlusion and stenosis of left carotid artery: Secondary | ICD-10-CM

## 2021-07-24 NOTE — Patient Instructions (Addendum)
Medication Instructions:  Your physician recommends that you continue on your current medications as directed. Please refer to the Current Medication list given to you today.   *If you need a refill on your cardiac medications before your next appointment, please call your pharmacy*   Lab Work: NONE ordered at this time of appointment   If you have labs (blood work) drawn today and your tests are completely normal, you will receive your results only by: [place] (if you have MyChart) OR A paper copy in the mail If you have any lab test that is abnormal or we need to change your treatment, we will call you to review the results.   Testing/Procedures: NONE ordered at this time of appointment     Follow-Up: At [facility], you and your health needs are our priority.  As part of our continuing mission to provide you with exceptional heart care, we have created designated Provider Care Teams.  These Care Teams include your primary Cardiologist (physician) and Advanced Practice Providers (APPs -  Physician Assistants and Nurse Practitioners) who all work together to provide you with the care you need, when you need it.  We recommend signing up for the patient portal called "MyChart".  Sign up information is provided on this After Visit Summary.  MyChart is used to connect with patients for Virtual Visits (Telemedicine).  Patients are able to view lab/test results, encounter notes, upcoming appointments, etc.  Non-urgent messages can be sent to your provider as well.   To learn more about what you can do with MyChart, go to [email or web address].    Your next appointment:   3 month(s)  The format for your next appointment:   In Person  Provider:   Dr. Gardiner Rhyme    Other Instructions Monitor Blood Pressure. If Systolic Blood Pressure (top number) is consistently less than 110, go back to Losartan 25 mg.   Important Information About Sugar

## 2021-07-24 NOTE — Progress Notes (Unsigned)
Cardiology Office Note:    Date:  07/26/2021   ID:  Helen Washington, DOB [birth year], MRN [id number]  PCP:  Prince Solian, MD   [facility] Prov[facility] Cardiologist:  Donato Heinz, MD     Referring MD: Prince Solian, MD   Chief Complaint  Patient presents with   Follow-up    Seen for Dr. Gardiner Rhyme    History of Present Illness:    Helen Washington is a 76 y.o. female with a hx of hypertension, hyperlipidemia, PAD and CAD.  Patient was previously referred by Dr. Trula Slade for preoperative evaluation prior to bilateral femoral enterectomy procedure.  Angiography performed in April 2021 showed bilateral femoral artery occlusion, near occlusive popliteal disease.  She quit smoking in 2002 however smoked 1 pack/day for 45 years.  Coronary CT obtained in June 2021 showed calcium score 1503 which placed the patient 97th percentile.  He also revealed severe stenosis in small nondominant left circumflex artery.  Myoview obtained on 08/24/2019 showed a large perfusion defect in the basal to apical inferior and inferoseptal wall, minimal reversibility, EF 42%.  Echocardiogram obtained on 08/29/2019 showed EF 45 to 50%, grade 1 DD, RVSP 43 mmHg, moderate MR.  Repeat echocardiogram in October 2022 showed EF 45 to 50%, normal RV function, moderate to severe MR, RVSP 41 mmHg.  Cardiac MRI obtained in November 2020 showed subendocardial LGE consistent with prior infarct in the basal to apical inferior, mid inferior lateral and apex with nonviable mid inferolateral, apical inferior and apex wall, EF 41%, mild to moderate MR.  Cardiac catheterization performed in December 2022 showed multivessel disease with occluded mid RCA, 50% mid LAD, 70% OM, 20% proximal left circumflex artery, EF 45%, LVEDP 16 mmHg, medical therapy was recommended.  He underwent left carotid enterectomy in February 2023.  Currently on aspirin and Plavix.  Patient was last seen by Dr. Gardiner Rhyme in April 2023 at which time he was doing  well.  Patient presents today for follow-up.  She has been noticing some increased exertional fatigue and dyspnea.  She denies any chest pain.  Given reassuring cardiac catheterization result from December 2022, I recommended holding off on additional ischemic evaluation. Apparently her BP was elevated when she saw her PCP 2 weeks ago and losartan was increased from 25 mg to 50 mg.  Since then, her blood pressure has been low.  Blood pressure today was 104/58.  She does notice occasional dizziness.  I asked her to monitor her blood pressure, if systolic blood pressures persistently less than 110, she should go back to 25 mg daily of losartan.  If there are worsening dyspnea and fatigue continues despite going down on the blood pressure medication, we may have to consider ischemic work-up on the next visit.  She is complaining of right lower extremity pain, I took a look at her ABI in December, her ABI was normal in the right lower extremity and appears to have triphasic blood flow seen throughout, she does have significantly low ABI in the left lower extremity however she does not complain of any claudication symptoms on the left side.  Blood work obtained in February 2023 showed a very well-controlled cholesterol.  Last blood work in April showed renal function improved to 1.27.  She can follow-up with Dr. Gardiner Rhyme in 3 months.  Past Medical History:  Diagnosis Date   Arthritis    Complication of anesthesia    woke up during colonoscopy   Coronary artery disease    Hyperlipidemia  Hypertension    Insomnia    Sleep apnea     Past Surgical History:  Procedure Laterality Date   ABDOMINAL AORTOGRAM W/LOWER EXTREMITY Bilateral 05/15/2019   Procedure: ABDOMINAL AORTOGRAM W/LOWER EXTREMITY;  Surgeon: Serafina Mitchell, MD;  Location: [place] [facility];  Service: Cardiovascular;  Laterality: Bilateral;   ABDOMINAL AORTOGRAM W/LOWER EXTREMITY N/A 08/26/2020   Procedure: ABDOMINAL AORTOGRAM W/LOWER  EXTREMITY;  Surgeon: Serafina Mitchell, MD;  Location: [place] [facility];  Service: Cardiovascular;  Laterality: N/A;   CATARACT EXTRACTION, BILATERAL  12/2015;01/2016   [facility]   COLONOSCOPY     ENDARTERECTOMY Left 03/13/2021   Procedure: LEFT CAROTID ENDARTERECTOMY;  Surgeon: Serafina Mitchell, MD;  Location: [facility];  Service: Vascular;  Laterality: Left;   ENDARTERECTOMY FEMORAL Bilateral 09/13/2019   Procedure: BILATERAL FEMORAL ENDARTERECTOMY with Patch Angioplasty;  Surgeon: Serafina Mitchell, MD;  Location: [place];  Service: Vascular;  Laterality: Bilateral;   INSERTION OF ILIAC STENT Left 09/13/2019   Procedure: INSERTION OF LEFT ILIAC STENT;  Surgeon: Serafina Mitchell, MD;  Location: [place];  Service: Vascular;  Laterality: Left;   LEFT HEART CATH AND CORONARY ANGIOGRAPHY N/A 12/30/2020   Procedure: LEFT HEART CATH AND CORONARY ANGIOGRAPHY;  Surgeon: Troy Sine, MD;  Location: [place] [facility];  Service: Cardiovascular;  Laterality: N/A;   PATCH ANGIOPLASTY Bilateral 09/13/2019   Procedure: PATCH ANGIOPLASTY Bilateral;  Surgeon: Serafina Mitchell, MD;  Location: [facility];  Service: Vascular;  Laterality: Bilateral;   PERIPHERAL VASCULAR INTERVENTION Right 08/26/2020   Procedure: PERIPHERAL VASCULAR INTERVENTION;  Surgeon: Serafina Mitchell, MD;  Location: [place] [facility];  Service: Cardiovascular;  Laterality: Right;  femoral popliteal artery   TUBAL LIGATION  1969    Current Medications: Current Meds  Medication Sig   losartan (COZAAR) 50 MG tablet 1 tablet Orally Once a day for 90 days     Allergies:   Codeine   Social History   Socioeconomic History   Marital status: Divorced    Spouse name: Not on file   Number of children: 1   Years of education: 12   Highest education level: Not on file  Occupational History    Comment: retired Scientist, clinical (histocompatibility and immunogenetics), Sport and exercise psychologist  Tobacco Use   Smoking status: Former    Packs/day: 1.00    Years: 36.00    Total pack years: 36.00     Types: Cigarettes    Quit date: 04/01/2000    Years since quitting: 21.3   Smokeless tobacco: Never  Vaping Use   Vaping Use: Never used  Substance and Sexual Activity   Alcohol use: Yes    Comment: 1 glass wine per week   Drug use: No   Sexual activity: Not on file  Other Topics Concern   Not on file  Social History Narrative   Lives alone   Caffeine - coffee, 1 cup daily; maybe 4 teas per week   Right handed   Social Determinants of Health   Financial Resource Strain: Not on file  Food Insecurity: Not on file  Transportation Needs: Not on file  Physical Activity: Not on file  Stress: Not on file  Social Connections: Not on file     Family History: The patient's family history includes Anxiety disorder in her brother; Arthritis in her mother; Breast cancer in her cousin; CVA in her mother; Cancer in her father; Diverticulitis in her mother; Diverticulosis in her sister; Healthy in her daughter; Hypertension in  her brother and sister; Kidney Stones in her brother; Stroke in her mother; Suicidality in her brother; Ulcers in her brother.  ROS:   Please see the history of present illness.     All other systems reviewed and are negative.  EKGs/Labs/Other Studies Reviewed:    The following studies were reviewed today:  Echo 11/04/2020  1. There is moderate to severe mitral regurgitation. There is an  eccentric posteriorly directed jet of MR. This appears to be related to  tethering and restricted movement of the PMVL in systole (IIIB). Some of  the images suggest hockey stick deformity  of the AMVL, which would indicated possible rheumatic MV disease. Would  recommend a TEE for clarification of MR severity and mechanism. Elevated  RVSP and E > A mitral inflow suggest significant MR. The mitral valve is  abnormal. Moderate to severe mitral  valve regurgitation. No evidence of mitral stenosis.   2. Left ventricular ejection fraction, by estimation, is 45 to 50%. The   left ventricle has mildly decreased function. The left ventricle has no  regional wall motion abnormalities. Left ventricular diastolic function  could not be evaluated. The average  left ventricular global longitudinal strain is -14.6 %. The global  longitudinal strain is abnormal.   3. Right ventricular systolic function is normal. The right ventricular  size is normal. There is mildly elevated pulmonary artery systolic  pressure. The estimated right ventricular systolic pressure is 40.3 mmHg.   4. Left atrial size was mildly dilated.   5. The aortic valve is tricuspid. There is mild thickening of the aortic  valve. Aortic valve regurgitation is not visualized. No aortic stenosis is  present.   6. The inferior vena cava is normal in size with greater than 50%  respiratory variability, suggesting right atrial pressure of 3 mmHg.   Comparison(s): No significant change from prior study.    Cath 12/30/2020   Prox RCA lesion is 99% stenosed.   Prox RCA to Mid RCA lesion is 100% stenosed.   Ost Cx to Prox Cx lesion is 10% stenosed.   Mid LAD lesion is 50% stenosed.   Mid RCA to Dist RCA lesion is 99% stenosed.   Prox Cx lesion is 20% stenosed.   2nd Mrg lesion is 70% stenosed.   LV end diastolic pressure is mildly elevated.   There is evidence for severe diffuse multivessel coronary calcification involving all 3 major coronary arteries.  The proximal LAD has focal  50% calcification just proximal to diagonal bifurcation; the circumflex vessel has 20 and 70% eccentric calcification and the RCA is severely calcified with segmental subtotal stenoses with mid bridging collaterals and extensive left-to-right collateralization from the LAD and septal perforating arteries supplying the PDA.   Mild to moderate LV dysfunction with EF estimated approximately 45%.  There is significant mid to apical inferior hypocontractility with mild mid anterolateral hypocontractility.  LVEDP 16 mmHg.    RECOMMENDATION: Medical therapy for CAD with continuation of DAPT and aggressive lipid-lowering therapy with target LDL in the mid 41s or below.    EKG:  EKG is ordered today.  The ekg ordered today demonstrates sinus bradycardia, heart rate 56 bpm, T wave inversion in the inferior leads.  Minimal ST downsloping in V5 and V6, overall unchanged from the previous EKG.  Recent Labs: 03/10/2021: ALT 16 03/14/2021: Hemoglobin 9.0; Platelets 170 04/22/2021: BUN 17; Creatinine, Ser 1.27; Magnesium 2.4; Potassium 5.1; Sodium 138  Recent Lipid Panel    Component Value Date/Time  CHOL 100 03/14/2021 0357   CHOL 178 01/14/2021 0825   TRIG 59 03/14/2021 0357   HDL 55 03/14/2021 0357   HDL 86 01/14/2021 0825   CHOLHDL 1.8 03/14/2021 0357   VLDL 12 03/14/2021 0357   LDLCALC 33 03/14/2021 0357   LDLCALC 78 01/14/2021 0825     Risk Assessment/Calculations:           Physical Exam:    VS:  BP (!) 104/58   Pulse (!) 56   Ht '4\' 11"'$  (1.499 m)   Wt 112 lb 3.2 oz (50.9 kg)   SpO2 97%   BMI 22.66 kg/m     Wt Readings from Last 3 Encounters:  07/24/21 112 lb 3.2 oz (50.9 kg)  04/22/21 114 lb 12.8 oz (52.1 kg)  04/06/21 115 lb (52.2 kg)     GEN:  Well nourished, well developed in no acute distress HEENT: Normal NECK: No JVD; No carotid bruits LYMPHATICS: No lymphadenopathy CARDIAC: RRR, no murmurs, rubs, gallops RESPIRATORY:  Clear to auscultation without rales, wheezing or rhonchi  ABDOMEN: Soft, non-tender, non-distended MUSCULOSKELETAL:  No edema; No deformity  SKIN: Warm and dry NEUROLOGIC:  Alert and oriented x 3 PSYCHIATRIC:  Normal affect   ASSESSMENT:    1. Other fatigue   2. Coronary artery disease involving native coronary artery of native heart without angina pectoris   3. Ischemic cardiomyopathy   4. Stenosis of left carotid artery   5. Primary hypertension   6. Hyperlipidemia LDL goal <70   7. PAD (peripheral artery disease) (HCC)    PLAN:    In order of  problems listed above:  Fatigue and dyspnea on exertion: Blood pressure borderline low on arrival.  Apparently her blood pressure was elevated during recent office visit with PCP and her losartan was increased to 50 mg daily.  I asked her to monitor her blood pressure and if systolic blood pressure remain consistently lower than 110 mmHg, she has been instructed to reduce losartan back to 25 mg daily.  She has recent cardiac catheterization in December 2022, will hold off on ischemic work-up given lack of chest discomfort  CAD: Recent cardiac catheterization in December 2022.  This revealed occluded proximal to mid RCA, moderate 50% mid LAD lesion, 70% OM2 lesion.  She was managed medically  Ischemic cardiomyopathy: EF around 45%.  Euvolemic on exam  Carotid artery disease, recently underwent carotid enterectomy in February.  On aspirin and Plavix  Hypertension: Blood pressure borderline low today.  See #1  Hyperlipidemia: On Crestor  PAD: Previous ABI obtained in December 2022 revealed a normal ABI in the right lower extremity, however significantly low ABI in the left lower extremity.  However patient does not have any obvious claudication symptoms.            Medication Adjustments/Labs and Tests Ordered: Current medicines are reviewed at length with the patient today.  Concerns regarding medicines are outlined above.  Orders Placed This Encounter  Procedures   EKG 12-Lead   No orders of the defined types were placed in this encounter.   Patient Instructions  Medication Instructions:  Your physician recommends that you continue on your current medications as directed. Please refer to the Current Medication list given to you today.   *If you need a refill on your cardiac medications before your next appointment, please call your pharmacy*   Lab Work: NONE ordered at this time of appointment   If you have labs (blood work) drawn today and your  tests are completely normal,  you will receive your results only by: MyChart Message (if you have MyChart) OR A paper copy in the mail If you have any lab test that is abnormal or we need to change your treatment, we will call you to review the results.   Testing/Procedures: NONE ordered at this time of appointment     Follow-Up: At [facility], you and your health needs are our priority.  As part of our continuing mission to provide you with exceptional heart care, we have created designated Provider Care Teams.  These Care Teams include your primary Cardiologist (physician) and Advanced Practice Providers (APPs -  Physician Assistants and Nurse Practitioners) who all work together to provide you with the care you need, when you need it.  We recommend signing up for the patient portal called "MyChart".  Sign up information is provided on this After Visit Summary.  MyChart is used to connect with patients for Virtual Visits (Telemedicine).  Patients are able to view lab/test results, encounter notes, upcoming appointments, etc.  Non-urgent messages can be sent to your provider as well.   To learn more about what you can do with MyChart, go to [email or web address].    Your next appointment:   3 month(s)  The format for your next appointment:   In Person  Provider:   Dr. Gardiner Rhyme    Other Instructions Monitor Blood Pressure. If Systolic Blood Pressure (top number) is consistently less than 110, go back to Losartan 25 mg.   Important Information About Sugar         Hilbert Corrigan, [place]  07/26/2021 9:44 PM    [place]

## 2021-07-26 ENCOUNTER — Encounter: Payer: Self-pay | Admitting: Physician Assistant

## 2021-07-26 DIAGNOSIS — G4733 Obstructive sleep apnea (adult) (pediatric): Secondary | ICD-10-CM | POA: Diagnosis not present

## 2021-08-12 ENCOUNTER — Encounter: Payer: Self-pay | Admitting: Cardiology

## 2021-08-25 DIAGNOSIS — I2510 Atherosclerotic heart disease of native coronary artery without angina pectoris: Secondary | ICD-10-CM | POA: Diagnosis not present

## 2021-08-25 DIAGNOSIS — I10 Essential (primary) hypertension: Secondary | ICD-10-CM | POA: Diagnosis not present

## 2021-08-25 DIAGNOSIS — I5022 Chronic systolic (congestive) heart failure: Secondary | ICD-10-CM | POA: Diagnosis not present

## 2021-08-25 DIAGNOSIS — E785 Hyperlipidemia, unspecified: Secondary | ICD-10-CM | POA: Diagnosis not present

## 2021-08-26 DIAGNOSIS — G4733 Obstructive sleep apnea (adult) (pediatric): Secondary | ICD-10-CM | POA: Diagnosis not present

## 2021-09-15 ENCOUNTER — Telehealth: Payer: Self-pay | Admitting: Pharmacist

## 2021-09-15 NOTE — Telephone Encounter (Signed)
[employer] Patient assistance application faxed to [facility]

## 2021-09-25 DIAGNOSIS — G4733 Obstructive sleep apnea (adult) (pediatric): Secondary | ICD-10-CM | POA: Diagnosis not present

## 2021-10-05 ENCOUNTER — Ambulatory Visit (INDEPENDENT_AMBULATORY_CARE_PROVIDER_SITE_OTHER)
Admission: RE | Admit: 2021-10-05 | Discharge: 2021-10-05 | Disposition: A | Discharge status: Home / Self Care | Payer: Medicare HMO | Source: Ambulatory Visit | Attending: Surgery | Admitting: Surgery

## 2021-10-05 ENCOUNTER — Ambulatory Visit: Payer: Medicare HMO | Admitting: Surgery

## 2021-10-05 ENCOUNTER — Ambulatory Visit (HOSPITAL_COMMUNITY)
Admission: RE | Admit: 2021-10-05 | Discharge: 2021-10-05 | Disposition: A | Discharge status: Home / Self Care | Payer: Medicare HMO | Source: Ambulatory Visit | Attending: Surgery | Admitting: Surgery

## 2021-10-05 ENCOUNTER — Encounter: Payer: Self-pay | Admitting: Surgery

## 2021-10-05 VITALS — BP 122/71 | HR 55 | Temp 98.2°F | Resp 20 | Ht 59.0 in | Wt 108.0 lb

## 2021-10-05 DIAGNOSIS — I6522 Occlusion and stenosis of left carotid artery: Secondary | ICD-10-CM

## 2021-10-05 DIAGNOSIS — I739 Peripheral vascular disease, unspecified: Secondary | ICD-10-CM | POA: Diagnosis not present

## 2021-10-05 DIAGNOSIS — I70213 Atherosclerosis of native arteries of extremities with intermittent claudication, bilateral legs: Secondary | ICD-10-CM

## 2021-10-05 DIAGNOSIS — I6523 Occlusion and stenosis of bilateral carotid arteries: Secondary | ICD-10-CM | POA: Insufficient documentation

## 2021-10-05 NOTE — Progress Notes (Signed)
[facility]  Patient name: Helen Washington MRN: [id number] DOB: 1945-02-18 Sex: female   REASON FOR VISIT:    Follow-up  HISOTRY OF PRESENT ILLNESS:    Helen Washington is a 77 y.o. female who suffers from bilateral claudication.  On 09/13/2019, she underwent bilateral femoral endarterectomy with patch angioplasty using a bovine patch.  Simultaneously she had a left external iliac stent placed.  She had recurrence of her symptoms.  She subsequently underwent stenting of her right superficial femoral artery on 08/26/2020 for a 90% right superficial femoral artery lesion and a 90% popliteal lesion.  She has a short segment left popliteal occlusion which has not been addressed.  She has no complaints today.  She will occasionally get left calf pain when walking long distances, but she does not do this on a regular basis.  Other than that, she does not have any other leg complaints.  She has no open wounds.   She was found to have greater than 80% left carotid stenosis which was asymptomatic.  She underwent left carotid endarterectomy with bovine pericardial patch angioplasty on 03/13/2021 for asymptomatic left carotid stenosis.  Intraoperative findings included a heavily calcified 80% stenosis.   The patient suffers from white coat hypertension.  she takes a statin for hypercholesterolemia.  The patient is a former smoker   PAST MEDICAL HISTORY:   Past Medical History:  Diagnosis Date   Arthritis    Complication of anesthesia    woke up during colonoscopy   Coronary artery disease    Hyperlipidemia    Hypertension    Insomnia    Sleep apnea      FAMILY HISTORY:   Family History  Problem Relation Age of Onset   Stroke Mother    Arthritis Mother    CVA Mother    Diverticulitis Mother    Cancer Father    Hypertension Sister    Diverticulosis Sister    Breast cancer Cousin    Suicidality Brother    Hypertension Brother     Anxiety disorder Brother    Kidney Stones Brother    Ulcers Brother    Healthy Daughter     SOCIAL HISTORY:   Social History   Tobacco Use   Smoking status: Former    Packs/day: 1.00    Years: 36.00    Total pack years: 36.00    Types: Cigarettes    Quit date: 04/01/2000    Years since quitting: 21.5   Smokeless tobacco: Never  Substance Use Topics   Alcohol use: Yes    Comment: 1 glass wine per week     ALLERGIES:   Allergies  Allergen Reactions   Codeine Nausea Only     CURRENT MEDICATIONS:   Current Outpatient Medications  Medication Sig Dispense Refill   alendronate (FOSAMAX) 70 MG tablet Take 70 mg by mouth every Monday. Take with a full glass of water on an empty stomach.     aspirin EC 81 MG tablet Take 1 tablet (81 mg total) by mouth daily. 90 tablet 3   carvedilol (COREG) 3.125 MG tablet TAKE 1 TABLET BY MOUTH 2 TIMES DAILY. 180 tablet 3   Cholecalciferol (VITAMIN D3) 50 MCG (2000 UT) CAPS Take 2,000 Units by mouth daily.     clopidogrel (PLAVIX) 75 MG tablet TAKE 1 TABLET BY MOUTH EVERY DAY 90 tablet 3   empagliflozin (JARDIANCE) 10 MG TABS tablet TAKE 1 TABLET BY MOUTH DAILY BEFORE BREAKFAST. 30 tablet 9  ezetimibe (ZETIA) 10 MG tablet Take 1 tablet (10 mg total) by mouth daily. 90 tablet 3   losartan (COZAAR) 25 MG tablet 1 tablet Orally Once a day for 90 days     rosuvastatin (CRESTOR) 40 MG tablet Take 1 tablet (40 mg total) by mouth daily. 90 tablet 3   No current facility-administered medications for this visit.    REVIEW OF SYSTEMS:   '[X]'$  denotes positive finding, '[ ]'$  denotes negative finding Cardiac  Comments:  Chest pain or chest pressure:    Shortness of breath upon exertion:    Short of breath when lying flat:    Irregular heart rhythm:        Vascular    Pain in calf, thigh, or hip brought on by ambulation: x   Pain in feet at night that wakes you up from your sleep:     Blood clot in your veins:    Leg swelling:          Pulmonary    Oxygen at home:    Productive cough:     Wheezing:         Neurologic    Sudden weakness in arms or legs:     Sudden numbness in arms or legs:     Sudden onset of difficulty speaking or slurred speech:    Temporary loss of vision in one eye:     Problems with dizziness:         Gastrointestinal    Blood in stool:     Vomited blood:         Genitourinary    Burning when urinating:     Blood in urine:        Psychiatric    Major depression:         Hematologic    Bleeding problems:    Problems with blood clotting too easily:        Skin    Rashes or ulcers:        Constitutional    Fever or chills:      PHYSICAL EXAM:   Vitals:   10/05/21 1123 10/05/21 1124  BP: 137/76 122/71  Pulse: (!) 55   Resp: 20   Temp: 98.2 F (36.8 C)   SpO2: 97%   Weight: 108 lb (49 kg)   Height: '4\' 11"'$  (1.499 m)     GENERAL: The patient is a well-nourished female, in no acute distress. The vital signs are documented above. CARDIAC: There is a regular rate and rhythm.  VASCULAR: Palpable right posterior tibial pulse, nonpalpable left PULMONARY: Non-labored respirations MUSCULOSKELETAL: There are no major deformities or cyanosis. NEUROLOGIC: No focal weakness or paresthesias are detected. SKIN: There are no ulcers or rashes noted. PSYCHIATRIC: The patient has a normal affect.  STUDIES:   I have reviewed the following: Right Carotid: Velocities in the right ICA are consistent with a 1-39%  stenosis.   Left Carotid: Patent carotid endarterectomy with no evidence for  restenosis. The                ECA appears >50% stenosed.   Vertebrals:  Bilateral vertebral arteries demonstrate antegrade flow.  Subclavians: Right subclavian artery flow was disturbed. Normal flow               hemodynamics were seen in the left subclavian artery.   ABI/TBIToday's ABIToday's TBIPrevious ABIPrevious TBI  +-------+-----------+-----------+------------+------------+  Right   1.06       0.55  1.06        0.60          +-------+-----------+-----------+------------+------------+  Left   0.57       0.34       0.57        0.32          +-------+-----------+-----------+------------+------------+ Right toe pressure:  78 Left toe pressure:  48  Right: Patent right lower extremity SFA and popliteal stents with no  visualized stenosis.   Left: Known occluded left popliteal artery. Left external iliac artery  stent appears patent with no visualized stenosis.    MEDICAL ISSUES:   Carotid: Left endarterectomy site is widely patent.  There is no significant disease on the right.  She will follow-up in 1 year with repeat duplex  PAD: All previous interventions remain widely patent.  She has a known short segment proximal left popliteal occlusion.  She is minimally symptomatic from this.  No intervention is recommended at this time.  She will follow-up in 1 year with repeat imaging.  She knows to contact me sooner if she develops a change in symptoms or nonhealing wound.    Leia Alf, MD, FACS [facility] [phone] Pager [phone]

## 2021-10-08 ENCOUNTER — Other Ambulatory Visit: Payer: Self-pay

## 2021-10-08 DIAGNOSIS — I739 Peripheral vascular disease, unspecified: Secondary | ICD-10-CM

## 2021-10-08 DIAGNOSIS — I6523 Occlusion and stenosis of bilateral carotid arteries: Secondary | ICD-10-CM

## 2021-10-08 DIAGNOSIS — I70213 Atherosclerosis of native arteries of extremities with intermittent claudication, bilateral legs: Secondary | ICD-10-CM

## 2021-10-12 ENCOUNTER — Encounter: Payer: Self-pay | Admitting: *Deleted

## 2021-10-13 ENCOUNTER — Telehealth (INDEPENDENT_AMBULATORY_CARE_PROVIDER_SITE_OTHER): Payer: Medicare HMO | Admitting: Adult Health

## 2021-10-13 DIAGNOSIS — Z9989 Dependence on other enabling machines and devices: Secondary | ICD-10-CM

## 2021-10-13 DIAGNOSIS — G4733 Obstructive sleep apnea (adult) (pediatric): Secondary | ICD-10-CM | POA: Diagnosis not present

## 2021-10-13 NOTE — Progress Notes (Signed)
  [facility] [address] [place]. [place] [address] [phone]  PRIMARY NEUROLOGIST:    Virtual Visit via Telephone Note  I connected with [name] on 10/13/21 at  1:00 PM EDT by telephone located remotely at [facility] and verified that I am speaking with the correct person using two identifiers who reports being located at home.    Visit scheduled by [facility]/Donnamae Muilenburg. She discussed the limitations, risks, security and privacy concerns of performing an evaluation and management service by telephone and the availability of in person appointments. I also discussed with the patient that there may be a patient responsible charge related to this service. The patient expressed understanding and agreed to proceed. See telephone note for consent and additional scheduling information.    History of Present Illness:  Helen Washington is a 76 y.o. female who has been followed in this office for obstructive sleep apnea on CPAP.  She was initially scheduled for video visit but was unable to get her video to connect we transition to a telephone visit  She reports that the CPAP is working well for her.  She denies any new issues.  Reports that she is having issues with the billing from her DME company. download is below      Observations/Objective:  Generalized: Well developed, in no acute distress   Neurological examination  Mentation: Alert oriented to time, place, history taking. Follows all commands speech and language fluent  Assessment and Plan:  1: Obstructive sleep apnea on CPAP  Good compliance Good treatment of apnea Encourage patient to use CPAP nightly greater than 4 hours each night    Follow Up Instructions:   F/U in 1 year in office    I discussed the assessment and treatment plan with the patient.  The patient was provided an opportunity to ask questions and all were answered to their satisfaction. The patient agreed with  the plan and verbalized an understanding of the instructions.   I provided 7 minutes of non-face-to-face time during this encounter.    Ward Givens NP-C  [facility] [address] [place], [place] [address]  Phone [phone] Fax [phone] \

## 2021-10-19 NOTE — Progress Notes (Unsigned)
Cardiology Office Note:    Date:  10/21/2021   ID:  Helen Washington, DOB 03-17-1945, MRN [id number]  PCP:  Prince Solian, MD  Cardiologist:  Donato Heinz, MD  Electrophysiologist:  None   Referring MD: Prince Solian, MD   Chief Complaint  Patient presents with   Coronary Artery Disease    History of Present Illness:    Helen Washington is a 75 y.o. female with a hx of hypertension, hyperlipidemia, PAD who presents for follow-up.  She was referred by Dr. Trula Slade for preop evaluation prior to bilateral femoral endarterectomy.  She was seen by Dr. Trula Slade for evaluation of claudication.  Angiogram on 05/15/2019 showed bilateral femoral artery occlusion, bilateral near occlusive popliteal stenosis.  Reports she has pain in her legs with minimal exertion, which limits her activity.  Up until 1 month ago she was doing water aerobics 3 times per week.  Would be in the pool for 3 hours.  She denies any exertional chest pain or dyspnea.  She quit smoking in 2002, smoked 1 pack/day x 45 years.  Denies any history of heart disease in her immediate family.  Coronary CTA on 06/30/2019 showed calcium score 1503 (97 percentile).  CT FFR showed severe stenosis in small nondominant circumflex artery (was read as occluded mid LAD and mid RCA on FFR but reviewed with Dr Meda Coffee and appears to not be occluded but rather not modeled on FFR due to small vessel size <1.41m.  Lexiscan Myoview on 08/24/2019 showed large perfusion defect in basal to apical inferior/inferoseptal, minimal reversibility, EF 42%.  Echocardiogram on 08/29/2019 showed LVEF 45 to 563% grade 1 diastolic dysfunction, RVSP 43 mmHg, moderate MR.  Echocardiogram 11/04/2020 showed EF 45 to 50%, normal RV function, moderate to severe MR, RVSP 41 mmHg.  Cardiac MRI on 12/16/2020 showed subendocardial LGE consistent with prior infarcts in basal to apical inferior, mid inferolateral, and apex with nonviable mid inferolateral, apical inferior, and apex;  EF 41%, normal RV, mild to moderate MR (regurgitant fraction 18%).  Cath on 12/30/2020 showed multivessel CAD (occluded mid RCA, 50% mid LAD, 70% OM, 20% proximal LCx), EF 45%, LVEDP 16 mmHg.  Medical therapy recommended.  Since last clinic visit, she reports is doing well.  Denies any chest pain, dyspnea, lower extremity edema, or palpitations.  Reports occasional lightheadedness, denies any syncope.  Occurs when turning head certain ways.  She is taking DAPT, denies any bleeding issues.  She is doing water aerobics 3 times weekly for 2 hours, no exertional symptoms.   BP Readings from Last 3 Encounters:  10/21/21 (!) 118/56  10/05/21 122/71  07/24/21 (!) 104/58    Past Medical History:  Diagnosis Date   Arthritis    Complication of anesthesia    woke up during colonoscopy   Coronary artery disease    Hyperlipidemia    Hypertension    Insomnia    Sleep apnea     Past Surgical History:  Procedure Laterality Date   ABDOMINAL AORTOGRAM W/LOWER EXTREMITY Bilateral 05/15/2019   Procedure: ABDOMINAL AORTOGRAM W/LOWER EXTREMITY;  Surgeon: BSerafina Mitchell MD;  [facility];  Service: Cardiovascular;  Laterality: Bilateral;   ABDOMINAL AORTOGRAM W/LOWER EXTREMITY N/A 08/26/2020   Procedure: ABDOMINAL AORTOGRAM W/LOWER EXTREMITY;  Surgeon: BSerafina Mitchell MD;  [facility];  Service: Cardiovascular;  Laterality: N/A;   CATARACT EXTRACTION, BILATERAL  12/2015;01/2016   [facility]   COLONOSCOPY     ENDARTERECTOMY Left 03/13/2021  Procedure: LEFT CAROTID ENDARTERECTOMY;  Surgeon: Serafina Mitchell, MD;  Location: [facility];  Service: Vascular;  Laterality: Left;   ENDARTERECTOMY FEMORAL Bilateral 09/13/2019   Procedure: BILATERAL FEMORAL ENDARTERECTOMY with Patch Angioplasty;  Surgeon: Serafina Mitchell, MD;  Location: [place];  Service: Vascular;  Laterality: Bilateral;   INSERTION OF ILIAC STENT Left 09/13/2019   Procedure: INSERTION OF LEFT  ILIAC STENT;  Surgeon: Serafina Mitchell, MD;  Location: [place];  Service: Vascular;  Laterality: Left;   LEFT HEART CATH AND CORONARY ANGIOGRAPHY N/A 12/30/2020   Procedure: LEFT HEART CATH AND CORONARY ANGIOGRAPHY;  Surgeon: Troy Sine, MD;  Location: [place] [facility];  Service: Cardiovascular;  Laterality: N/A;   PATCH ANGIOPLASTY Bilateral 09/13/2019   Procedure: PATCH ANGIOPLASTY Bilateral;  Surgeon: Serafina Mitchell, MD;  Location: [facility] OR;  Service: Vascular;  Laterality: Bilateral;   PERIPHERAL VASCULAR INTERVENTION Right 08/26/2020   Procedure: PERIPHERAL VASCULAR INTERVENTION;  Surgeon: Serafina Mitchell, MD;  Location: [place] [facility];  Service: Cardiovascular;  Laterality: Right;  femoral popliteal artery   TUBAL LIGATION  1969    Current Medications: Current Meds  Medication Sig   alendronate (FOSAMAX) 70 MG tablet Take 70 mg by mouth every Monday. Take with a full glass of water on an empty stomach.   aspirin EC 81 MG tablet Take 1 tablet (81 mg total) by mouth daily.   carvedilol (COREG) 3.125 MG tablet TAKE 1 TABLET BY MOUTH 2 TIMES DAILY.   Cholecalciferol (VITAMIN D3) 50 MCG (2000 UT) CAPS Take 2,000 Units by mouth daily.   clopidogrel (PLAVIX) 75 MG tablet TAKE 1 TABLET BY MOUTH EVERY DAY   empagliflozin (JARDIANCE) 10 MG TABS tablet TAKE 1 TABLET BY MOUTH DAILY BEFORE BREAKFAST.   ezetimibe (ZETIA) 10 MG tablet Take 1 tablet (10 mg total) by mouth daily.   losartan (COZAAR) 25 MG tablet 1 tablet Orally Once a day for 90 days   rosuvastatin (CRESTOR) 40 MG tablet Take 1 tablet (40 mg total) by mouth daily.     Allergies:   Codeine   Social History   Socioeconomic History   Marital status: Divorced    Spouse name: Not on file   Number of children: 1   Years of education: 12   Highest education level: Not on file  Occupational History    Comment: retired Scientist, clinical (histocompatibility and immunogenetics), Sport and exercise psychologist  Tobacco Use   Smoking status: Former    Packs/day: 1.00    Years: 36.00    Total pack  years: 36.00    Types: Cigarettes    Quit date: 04/01/2000    Years since quitting: 21.5   Smokeless tobacco: Never  Vaping Use   Vaping Use: Never used  Substance and Sexual Activity   Alcohol use: Yes    Comment: 1 glass wine per week   Drug use: No   Sexual activity: Not on file  Other Topics Concern   Not on file  Social History Narrative   Lives alone   Caffeine - coffee, 1 cup daily; maybe 4 teas per week   Right handed   Social Determinants of Health   Financial Resource Strain: Not on file  Food Insecurity: Not on file  Transportation Needs: Not on file  Physical Activity: Not on file  Stress: Not on file  Social Connections: Not on file     Family History: The patient's family history includes Anxiety disorder in her brother; Arthritis in her mother; Breast cancer in her cousin;  CVA in her mother; Cancer in her father; Diverticulitis in her mother; Diverticulosis in her sister; Healthy in her daughter; Hypertension in her brother and sister; Kidney Stones in her brother; Stroke in her mother; Suicidality in her brother; Ulcers in her brother.  ROS:   Please see the history of present illness.      All other systems reviewed and are negative.  EKGs/Labs/Other Studies Reviewed:    The following studies were reviewed today:   EKG:   06/06/2019: sinus rhythm, PVC, nonspecific T wave flattening, less than 1 mm ST depression in V6 07/13/2019: EKG was not ordered. 08/07/2019: EKG was not ordered. 05/14/2020: Sinus rhythm. Rate: 55 bpm. Q waves in V1/V2. Less than 1 mm ST depression in leads I, II, V5 and V6.  11/14/20: Sinus rhythm, rate 62, Q waves in V1/2, T wave inversions in leads III, aVF, less than 1 mm ST depression in leads V5/6  Recent Labs: 03/10/2021: ALT 16 03/14/2021: Hemoglobin 9.0; Platelets 170 04/22/2021: BUN 17; Creatinine, Ser 1.27; Magnesium 2.4; Potassium 5.1; Sodium 138  Recent Lipid Panel    Component Value Date/Time   CHOL 100 03/14/2021 0357    CHOL 178 01/14/2021 0825   TRIG 59 03/14/2021 0357   HDL 55 03/14/2021 0357   HDL 86 01/14/2021 0825   CHOLHDL 1.8 03/14/2021 0357   VLDL 12 03/14/2021 0357   LDLCALC 33 03/14/2021 0357   LDLCALC 78 01/14/2021 0825    Physical Exam:    VS:  BP (!) 118/56   Pulse (!) 55   Ht '4\' 11"'$  (1.499 m)   Wt 112 lb 6.4 oz (51 kg)   SpO2 97%   BMI 22.70 kg/m     Wt Readings from Last 3 Encounters:  10/21/21 112 lb 6.4 oz (51 kg)  10/05/21 108 lb (49 kg)  07/24/21 112 lb 3.2 oz (50.9 kg)     GEN:  in no acute distress HEENT: Normal NECK: No JVD; left carotid bruit CARDIAC: RRR, no murmurs, rubs, gallops RESPIRATORY:  Clear to auscultation without rales, wheezing or rhonchi  ABDOMEN: Soft, non-tender, non-distended MUSCULOSKELETAL:  No edema; No deformity  SKIN: Warm and dry NEUROLOGIC:  Alert and oriented x 3 PSYCHIATRIC:  Normal affect   ASSESSMENT:    1. Coronary artery disease involving native coronary artery of native heart without angina pectoris   2. Chronic systolic heart failure ([place])   3. Essential hypertension   4. PAD (peripheral artery disease) ([place])   5. Mitral valve insufficiency, unspecified etiology   6. Hyperlipidemia LDL goal <70      PLAN:    Coronary artery disease:Coronary CTA on 06/30/2019 showed calcium score 1503 (97 percentile).  CT FFR showed severe stenosis in small nondominant circumflex artery (was read as occluded mid LAD and mid RCA on FFR but reviewed with Dr Meda Coffee and appears to not be occluded but rather not modeled on FFR due to small vessel size <1.46m). Denies any anginal symptoms.  Lexiscan Myoview on 08/24/2019 showed large perfusion defect in basal to apical inferior/inferoseptal, minimal reversibility, EF 42%.  Echocardiogram on 08/29/2019 showed LVEF 45 to 522% grade 1 diastolic dysfunction, RVSP 43 mmHg, moderate MR. Echocardiogram 11/04/2020 showed EF 45 to 50%, normal RV function, moderate to severe MR, RVSP 41 mmHg.  Cardiac MRI on  12/16/2020 showed subendocardial LGE consistent with prior infarcts in basal to apical inferior, mid inferolateral, and apex with nonviable mid inferolateral, apical inferior, and apex; EF 41%, normal RV, mild to moderate MR (  regurgitant fraction 18%).  Cath on 12/30/2020 showed multivessel CAD (occluded mid RCA, 50% mid LAD, 70% OM, 20% proximal LCx), EF 45%, LVEDP 16 mmHg.  Medical therapy recommended.  She denies any anginal symptoms. -Continue aspirin 81 mg daily, Plavix 75 mg daily.  Check CBC -Continue rosuvastatin 40 mg daily, Zetia 10 mg daily  Chronic systolic heart failure: EF 45 to 50% on echocardiogram on 08/29/2019.  Stable at 45 to 50% on echo 11/04/2020.  EF 41% on CMR 12/16/2020.  Appears euvolemic. -Continue carvedilol 3.125 mg twice daily -Continue losartan 25 mg daily -Continue Jardiance 10 mg daily -Check BMET, magnesium.  If stable kidney function/potassium, plan to start spironolactone 12.5 mg daily  Mitral regurgitation: Moderate on echocardiogram 08/29/2019.  Echocardiogram 11/04/2020 showed moderate to severe MR with eccentric posterior directed MR jet, could be due to restricted posterior leaflet, although some images suggested hockey-stick deformity of the anterior leaflet which could indicate possible rheumatic disease.   -No murmur on exam. Cardiac MRI shows mild to moderate MR (regurgitant fraction 18%)  PAD: Angiogram on 05/15/2019 showed bilateral femoral artery occlusion, bilateral near occlusive popliteal stenosis.  Status post bilateral femoral endarterectomy on 09/13/2019.  Underwent stent to right superficial femoral and popliteal artery on 08/26/2020.  Continue aspirin and rosuvastatin.  Hypertension: On carvedilol 3.25 mg twice daily and losartan 25 mg daily.  Appears controlled.  Hyperlipidemia: Continue rosuvastatin 40 mg daily and Zetia 10 mg daily, LDL 33 on 03/14/2021  Carotid stenosis: Status post left carotid endarterectomy 03/13/2021.  Continue ASA, plavix,  statin  RTC in 3 months  Medication Adjustments/Labs and Tests Ordered: Current medicines are reviewed at length with the patient today.  Concerns regarding medicines are outlined above.  Orders Placed This Encounter  Procedures   Basic metabolic panel   Magnesium   TSH     No orders of the defined types were placed in this encounter.    Patient Instructions  Medication Instructions:  Your physician recommends that you continue on your current medications as directed. Please refer to the Current Medication list given to you today.  *If you need a refill on your cardiac medications before your next appointment, please call your pharmacy*   Lab Work: BMET, Mag, CBC today  If you have labs (blood work) drawn today and your tests are completely normal, you will receive your results only by: [place] (if you have MyChart) OR A paper copy in the mail If you have any lab test that is abnormal or we need to change your treatment, we will call you to review the results.  Follow-Up: At [facility], you and your health needs are our priority.  As part of our continuing mission to provide you with exceptional heart care, we have created designated Provider Care Teams.  These Care Teams include your primary Cardiologist (physician) and Advanced Practice Providers (APPs -  Physician Assistants and Nurse Practitioners) who all work together to provide you with the care you need, when you need it.  We recommend signing up for the patient portal called "MyChart".  Sign up information is provided on this After Visit Summary.  MyChart is used to connect with patients for Virtual Visits (Telemedicine).  Patients are able to view lab/test results, encounter notes, upcoming appointments, etc.  Non-urgent messages can be sent to your provider as well.   To learn more about what you can do with MyChart, go to [email or web address].    Your next appointment:   3 month(s)  The  format for your next appointment:   In Person  Provider:   Donato Heinz, MD               Signed, Donato Heinz, MD  10/21/2021 8:47 AM    [place]

## 2021-10-20 ENCOUNTER — Other Ambulatory Visit: Payer: Self-pay | Admitting: Internal Medicine

## 2021-10-20 DIAGNOSIS — Z1231 Encounter for screening mammogram for malignant neoplasm of breast: Secondary | ICD-10-CM

## 2021-10-21 ENCOUNTER — Ambulatory Visit: Payer: Medicare HMO | Attending: Cardiology | Admitting: Cardiology

## 2021-10-21 ENCOUNTER — Encounter: Payer: Self-pay | Admitting: Cardiology

## 2021-10-21 VITALS — BP 118/56 | HR 55 | Ht 59.0 in | Wt 112.4 lb

## 2021-10-21 DIAGNOSIS — E785 Hyperlipidemia, unspecified: Secondary | ICD-10-CM | POA: Diagnosis not present

## 2021-10-21 DIAGNOSIS — I10 Essential (primary) hypertension: Secondary | ICD-10-CM

## 2021-10-21 DIAGNOSIS — I5022 Chronic systolic (congestive) heart failure: Secondary | ICD-10-CM | POA: Diagnosis not present

## 2021-10-21 DIAGNOSIS — I739 Peripheral vascular disease, unspecified: Secondary | ICD-10-CM

## 2021-10-21 DIAGNOSIS — I2510 Atherosclerotic heart disease of native coronary artery without angina pectoris: Secondary | ICD-10-CM | POA: Diagnosis not present

## 2021-10-21 DIAGNOSIS — I340 Nonrheumatic mitral (valve) insufficiency: Secondary | ICD-10-CM

## 2021-10-21 NOTE — Addendum Note (Signed)
Addended by: Patria Mane A on: 10/21/2021 08:49 AM   Modules accepted: Orders

## 2021-10-21 NOTE — Patient Instructions (Signed)
Medication Instructions:  Your physician recommends that you continue on your current medications as directed. Please refer to the Current Medication list given to you today.  *If you need a refill on your cardiac medications before your next appointment, please call your pharmacy*   Lab Work: BMET, Mag, CBC today  If you have labs (blood work) drawn today and your tests are completely normal, you will receive your results only by: [place] (if you have MyChart) OR A paper copy in the mail If you have any lab test that is abnormal or we need to change your treatment, we will call you to review the results.  Follow-Up: At [facility], you and your health needs are our priority.  As part of our continuing mission to provide you with exceptional heart care, we have created designated Provider Care Teams.  These Care Teams include your primary Cardiologist (physician) and Advanced Practice Providers (APPs -  Physician Assistants and Nurse Practitioners) who all work together to provide you with the care you need, when you need it.  We recommend signing up for the patient portal called "MyChart".  Sign up information is provided on this After Visit Summary.  MyChart is used to connect with patients for Virtual Visits (Telemedicine).  Patients are able to view lab/test results, encounter notes, upcoming appointments, etc.  Non-urgent messages can be sent to your provider as well.   To learn more about what you can do with MyChart, go to [email or web address].    Your next appointment:   3 month(s)  The format for your next appointment:   In Person  Provider:   Donato Heinz, MD

## 2021-10-22 LAB — BASIC METABOLIC PANEL
BUN/Creatinine Ratio: 17 (ref 12–28)
BUN: 19 mg/dL (ref 8–27)
CO2: 23 mmol/L (ref 20–29)
Calcium: 10 mg/dL (ref 8.7–10.3)
Chloride: 105 mmol/L (ref 96–106)
Creatinine, Ser: 1.14 mg/dL — ABNORMAL HIGH (ref 0.57–1.00)
Glucose: 85 mg/dL (ref 70–99)
Potassium: 5 mmol/L (ref 3.5–5.2)
Sodium: 139 mmol/L (ref 134–144)
eGFR: 50 mL/min/{1.73_m2} — ABNORMAL LOW (ref >59)

## 2021-10-22 LAB — CBC
Hematocrit: 37.3 % (ref 34.0–46.6)
Hemoglobin: 12.4 g/dL (ref 11.1–15.9)
MCH: 31.2 pg (ref 26.6–33.0)
MCHC: 33.2 g/dL (ref 31.5–35.7)
MCV: 94 fL (ref 79–97)
Platelets: 215 10*3/uL (ref 150–450)
RBC: 3.97 x10E6/uL (ref 3.77–5.28)
RDW: 13.3 % (ref 11.7–15.4)
WBC: 6.8 10*3/uL (ref 3.4–10.8)

## 2021-10-22 LAB — MAGNESIUM: Magnesium: 2.2 mg/dL (ref 1.6–2.3)

## 2021-10-25 DIAGNOSIS — G4733 Obstructive sleep apnea (adult) (pediatric): Secondary | ICD-10-CM | POA: Diagnosis not present

## 2021-10-30 DIAGNOSIS — B338 Other specified viral diseases: Secondary | ICD-10-CM | POA: Diagnosis not present

## 2021-11-02 ENCOUNTER — Telehealth: Payer: Self-pay | Admitting: *Deleted

## 2021-11-02 ENCOUNTER — Telehealth: Payer: Self-pay | Admitting: Adult Health

## 2021-11-02 DIAGNOSIS — G4733 Obstructive sleep apnea (adult) (pediatric): Secondary | ICD-10-CM

## 2021-11-02 NOTE — Addendum Note (Signed)
Addended by: Gildardo Griffes on: 11/02/2021 02:26 PM   Modules accepted: Orders

## 2021-11-02 NOTE — Telephone Encounter (Signed)
Usage data has been printed from [employer].

## 2021-11-02 NOTE — Telephone Encounter (Signed)
Pt states there is a message on her CPAP that states High leak detected, please call pt to discuss.

## 2021-11-02 NOTE — Telephone Encounter (Signed)
I spoke with the patient. She states she has checked everything on her machine including the water chamber and cannot tell what is wrong with it. The issue came up last night when she tried to use it. I advised her to call [facility] to have them troubleshoot her machine. I also sent a community message to [place] letting them know she would call and placed order for them to troubleshoot. Pt has had this machine since 08/24/2016 so she should be eligible for a new machine if needed. Pt was last seen on 10/13/21 by MM. Pt verbalized appreciation for the call.

## 2021-11-02 NOTE — Telephone Encounter (Signed)
   Pre-operative Risk Assessment    Patient Name: Helen Washington  DOB: Jan 21, 1945 MRN: [id number]     Request for Surgical Clearance    Procedure:  Dental Extraction - Amount of Teeth to be Pulled:  1 TOOTH TO BE EXTRACTED; THIS WAS CONFIRMED WITH THE DDS OFFICE TODAY BY PHONE  Date of Surgery:  Clearance TBD                                 Surgeon:  DR. Iven Finn, DDS Surgeon's Group or Practice Name:  [facility] Phone number:  [phone] Fax number:  [phone]   Type of Clearance Requested:   - Medical  - Pharmacy:  Hold Aspirin and Clopidogrel (Plavix) ; ALSO STATES FOR FOSAMAX TO BE HELD WHICH WILL BE PROVIDED BY PCP; CARDIOLOGY WILL GIVE RECOMMENDATIONS ON ASA AND PLAVIX   5. What type of anesthesia will be used?  Press F2 and select the anesthesia to be used for the procedure.  :1}  Type of Anesthesia:  Local    Additional requests/questions:    Jiles Prows   11/02/2021, 12:45 PM

## 2021-11-03 NOTE — Telephone Encounter (Signed)
I didn't see the patient recently ,last visit with me was in 2020.  If her machine failed her already and she needs replacement soon, I will order a machine to similar settings as currently used, but like to get HST for baseline documentation if there is time.  Larey Seat, MD

## 2021-11-03 NOTE — Telephone Encounter (Signed)
   Patient Name: Helen Washington  DOB: Feb 21, 1945 MRN: [id number]  Primary Cardiologist: Donato Heinz, MD  Chart reviewed as part of pre-operative protocol coverage.   Simple dental extractions (i.e. 1-2 teeth) are considered low risk procedures per guidelines and generally do not require any specific cardiac clearance. It is also generally accepted that for simple extractions and dental cleanings, there is no need to interrupt blood thinner therapy.   SBE prophylaxis is not required for the patient from a cardiac standpoint.  I will route this recommendation to the requesting party via Epic fax function and remove from pre-op pool.  Please call with questions.  Lenna Sciara, NP 11/03/2021, 12:21 PM

## 2021-11-03 NOTE — Telephone Encounter (Signed)
Noted  

## 2021-11-03 NOTE — Telephone Encounter (Signed)
I spoke with the patient I let her know that Dr. Brett Fairy ordered an HST for her to have an updated study to get a new machine. Dr Dohmeier states in reviewing the data there is a mask leak present although her apnea still continues to be controlled despite the leaks but that could be causing her machine to give a message as well. She would recommend that DME complete mask refit and look at current machine. Pt verbalized understanding and verbalized appreciation. She is aware she will be called separately to schedule HST. Her appt with [facility] is today at 3 pm. She verbalized appreciation for the call.   Updated order sent to [facility] that includes mask refit in addition to troubleshooting machine.

## 2021-11-03 NOTE — Addendum Note (Signed)
Addended by: Darleen Crocker on: 11/03/2021 08:03 AM   Modules accepted: Orders

## 2021-11-03 NOTE — Addendum Note (Signed)
Addended by: Gildardo Griffes on: 11/03/2021 10:26 AM   Modules accepted: Orders

## 2021-11-06 ENCOUNTER — Other Ambulatory Visit: Payer: Self-pay | Admitting: Cardiology

## 2021-11-11 ENCOUNTER — Ambulatory Visit: Payer: Medicare HMO

## 2021-11-25 DIAGNOSIS — G4733 Obstructive sleep apnea (adult) (pediatric): Secondary | ICD-10-CM | POA: Diagnosis not present

## 2021-12-02 ENCOUNTER — Telehealth: Payer: Self-pay | Admitting: Adult Health

## 2021-12-02 NOTE — Telephone Encounter (Signed)
HST- [employer] medicare no auth req spoke to [facility] ref # [id number].  Patient is scheduled at [facility] for 12/29/21 at 2:30 pm.  Mailed packet to the patient.

## 2021-12-28 DIAGNOSIS — G4733 Obstructive sleep apnea (adult) (pediatric): Secondary | ICD-10-CM | POA: Diagnosis not present

## 2021-12-29 ENCOUNTER — Ambulatory Visit (INDEPENDENT_AMBULATORY_CARE_PROVIDER_SITE_OTHER): Payer: Medicare HMO | Admitting: Neurology

## 2021-12-29 ENCOUNTER — Ambulatory Visit
Admission: RE | Admit: 2021-12-29 | Discharge: 2021-12-29 | Disposition: A | Discharge status: Home / Self Care | Payer: Medicare HMO | Source: Ambulatory Visit | Attending: Internal Medicine | Admitting: Internal Medicine

## 2021-12-29 DIAGNOSIS — Z1231 Encounter for screening mammogram for malignant neoplasm of breast: Secondary | ICD-10-CM

## 2021-12-29 DIAGNOSIS — G4733 Obstructive sleep apnea (adult) (pediatric): Secondary | ICD-10-CM

## 2021-12-31 NOTE — Progress Notes (Unsigned)
   [facility]  HOME SLEEP TEST (Watch PAT) REPORT  STUDY DATE: 12/29/21  DOB: 19-Feb-1945  MRN: [id number]  ORDERING CLINICIAN: Star Age, MD, PhD - study read on behalf of Dr. Brett Fairy   REFERRING CLINICIAN: Ward Givens, NP  CLINICAL INFORMATION/HISTORY: 76 year-old female with a history of carotid artery stenosis, CAD, CHF, PAD, HLP and a prior diagnosis of OSA, who presents for evaluation of her sleep apnea. She has been compliant with her CPAP of 9 cm.    BMI: 22.7 kg/m  FINDINGS:   Sleep Summary:   Total Recording Time (hours, min): 7 hours, 28 min  Total Sleep Time (hours, min):  6 hours, 52 min  Percent REM (%):    21.8%   Respiratory Indices:   Calculated pAHI (per hour):  10.7/hour         REM pAHI:    23.7/hour       NREM pAHI: 7.1/hour  Central pAHI: 0/hour  Oxygen Saturation Statistics:    Oxygen Saturation (%) Mean: 92%   Minimum oxygen saturation (%):                 83%   O2 Saturation Range (%): 83 - 98%    O2 Saturation (minutes) <=88%: 2.8 min  Pulse Rate Statistics:   Pulse Mean (bpm):    66/min    Pulse Range (48 - 99/min)   IMPRESSION: OSA (obstructive sleep apnea)   RECOMMENDATION:  ***   INTERPRETING PHYSICIAN:   Star Age, MD, PhD Medical Director, [facility]) Diplomat, ABPN (Neurology and Sleep)   [facility] [address], [place], [place] [address] [phone]

## 2022-01-04 ENCOUNTER — Telehealth: Payer: Self-pay | Admitting: Neurology

## 2022-01-04 DIAGNOSIS — G4733 Obstructive sleep apnea (adult) (pediatric): Secondary | ICD-10-CM

## 2022-01-04 NOTE — Procedures (Signed)
   [facility]  HOME SLEEP TEST (Watch PAT) REPORT  STUDY DATE: 12/29/21  DOB: 12-Mar-1945  MRN: [id number]  ORDERING CLINICIAN: Star Age, MD, PhD - study read on behalf of Dr. Brett Fairy   REFERRING CLINICIAN: Ward Givens, NP  CLINICAL INFORMATION/HISTORY: 76 year-old female with a history of carotid artery stenosis, CAD, CHF, PAD, HLP and a prior diagnosis of OSA, who presents for evaluation of her sleep apnea. She has been compliant with her CPAP of 9 cm.    BMI: 22.7 kg/m  FINDINGS:   Sleep Summary:   Total Recording Time (hours, min): 7 hours, 28 min  Total Sleep Time (hours, min):  6 hours, 52 min  Percent REM (%):    21.8%   Respiratory Indices:   Calculated pAHI (per hour):  10.7/hour         REM pAHI:    23.7/hour       NREM pAHI: 7.1/hour  Central pAHI: 0/hour  Oxygen Saturation Statistics:    Oxygen Saturation (%) Mean: 92%   Minimum oxygen saturation (%):                 83%   O2 Saturation Range (%): 83 - 98%    O2 Saturation (minutes) <=88%: 2.8 min  Pulse Rate Statistics:   Pulse Mean (bpm):    66/min    Pulse Range (48 - 99/min)   IMPRESSION: OSA (obstructive sleep apnea)   RECOMMENDATION:  This home sleep test demonstrates overall mild obstructive sleep apnea with a total AHI of 10.7/hour and O2 nadir of 83%. Snoring was detected, intermittent, in the mild to moderate range.  The patient has been compliant with her CPAP, she is eligible for new equipment.  I will write for a new CPAP machine for home use.  A full night, in-lab PAP titration study may aid in improving proper treatment settings and with mask fit, if needed, down the road. Alternative treatments may include weight loss (where appropriate) along with avoidance of the supine sleep position (if possible), or an oral appliance in appropriate candidates.   Please note that untreated obstructive sleep apnea may carry additional perioperative morbidity. Patients  with significant obstructive sleep apnea should receive perioperative PAP therapy and the surgeons and particularly the anesthesiologist should be informed of the diagnosis and the severity of the sleep disordered breathing. The patient should be cautioned not to drive, work at heights, or operate dangerous or heavy equipment when tired or sleepy. Review and reiteration of good sleep hygiene measures should be pursued with any patient. Other causes of the patient's symptoms, including circadian rhythm disturbances, an underlying mood disorder, medication effect and/or an underlying medical problem cannot be ruled out based on this test. Clinical correlation is recommended.  The patient and her referring provider will be notified of the test results. The patient will be seen in follow up in sleep clinic at [facility], as necessary.  I certify that I have reviewed the raw data recording prior to the issuance of this report in accordance with the standards of the American Academy of Sleep Medicine (AASM).  INTERPRETING PHYSICIAN:   Star Age, MD, PhD Medical Director, [facility]) [place], ABPN (Neurology and Sleep)   [facility] [address] [place], [place] [address] [phone]

## 2022-01-04 NOTE — Telephone Encounter (Signed)
This patient saw Dr. Brett Washington for sleep evaluation on 11/02/2021.  I read the home sleep test from 12/29/2021 on Dr. Edwena Washington behalf:  Please call patient and advise her that her home sleep test confirms mild obstructive sleep apnea.  The patient has been compliant with CPAP of 9 cm, she should be eligible for new equipment.  I will write for a new CPAP machine, you can send the order to her existing DME company.  She will need a follow-up within 3 months of set up date, please schedule with Dr. Brett Washington or one of our nurse practitioners and encourage ongoing full compliance.

## 2022-01-05 NOTE — Telephone Encounter (Signed)
Phone rep has scheduled pt for 04/05/22 with a 10:00 check in.  Pt aware to bring CPAP and power cord

## 2022-01-05 NOTE — Telephone Encounter (Signed)
I called pt. I advised pt that Dr. Rexene Alberts reviewed her sleep study results since Dr. Brett Fairy out and found that pt has mild sleep apnea and qualifies for new machine. I reviewed PAP compliance expectations with the pt.  I advised pt that an order will be sent to a DME, [facility], and [facility] will call the pt within about one week after they file with the pt's insurance. [facility] will show the pt how to use the machine, fit for masks, and troubleshoot the CPAP if needed. Provided pt [facility] phone# [phone] in case they do not reach out in the next week and she can call.  Aware someone from our office will call at a later time to schedule her initial cpap f/u appt.  I have sent the order to [facility] and have received confirmation that they have received the order.  Pt wanted to change from [facility] to [facility].

## 2022-01-20 DIAGNOSIS — B351 Tinea unguium: Secondary | ICD-10-CM | POA: Diagnosis not present

## 2022-01-20 DIAGNOSIS — E785 Hyperlipidemia, unspecified: Secondary | ICD-10-CM | POA: Diagnosis not present

## 2022-01-20 DIAGNOSIS — M1990 Unspecified osteoarthritis, unspecified site: Secondary | ICD-10-CM | POA: Diagnosis not present

## 2022-01-20 DIAGNOSIS — I700 Atherosclerosis of aorta: Secondary | ICD-10-CM | POA: Diagnosis not present

## 2022-01-20 DIAGNOSIS — I5022 Chronic systolic (congestive) heart failure: Secondary | ICD-10-CM | POA: Diagnosis not present

## 2022-01-20 DIAGNOSIS — I2510 Atherosclerotic heart disease of native coronary artery without angina pectoris: Secondary | ICD-10-CM | POA: Diagnosis not present

## 2022-01-20 DIAGNOSIS — M810 Age-related osteoporosis without current pathological fracture: Secondary | ICD-10-CM | POA: Diagnosis not present

## 2022-01-20 DIAGNOSIS — K5904 Chronic idiopathic constipation: Secondary | ICD-10-CM | POA: Diagnosis not present

## 2022-01-20 DIAGNOSIS — Z23 Encounter for immunization: Secondary | ICD-10-CM | POA: Diagnosis not present

## 2022-01-20 DIAGNOSIS — I739 Peripheral vascular disease, unspecified: Secondary | ICD-10-CM | POA: Diagnosis not present

## 2022-01-20 DIAGNOSIS — I110 Hypertensive heart disease with heart failure: Secondary | ICD-10-CM | POA: Diagnosis not present

## 2022-01-26 NOTE — Telephone Encounter (Signed)
Received a notification from Anthony that the office notes should reflect a valid secondary diagnosis. Will ask if Jinny Blossom can addend her note to advise patient is a chronic CPAP user since 2018 and

## 2022-01-27 NOTE — Progress Notes (Unsigned)
Cardiology Office Note:    Date:  10/21/2021   ID:  Helen Washington, DOB 1945/12/22, MRN [id number]  PCP:  Prince Solian, MD  Cardiologist:  Donato Heinz, MD  Electrophysiologist:  None   Referring MD: Prince Solian, MD   Chief Complaint  Patient presents with   Coronary Artery Disease    History of Present Illness:    Helen Washington is a 77 y.o. female with a hx of hypertension, hyperlipidemia, PAD who presents for follow-up.  She was referred by Dr. Trula Slade for preop evaluation prior to bilateral femoral endarterectomy.  She was seen by Dr. Trula Slade for evaluation of claudication.  Angiogram on 05/15/2019 showed bilateral femoral artery occlusion, bilateral near occlusive popliteal stenosis.  Reports she has pain in her legs with minimal exertion, which limits her activity.  Up until 1 month ago she was doing water aerobics 3 times per week.  Would be in the pool for 3 hours.  She denies any exertional chest pain or dyspnea.  She quit smoking in 2002, smoked 1 pack/day x 45 years.  Denies any history of heart disease in her immediate family.  Coronary CTA on 06/30/2019 showed calcium score 1503 (97 percentile).  CT FFR showed severe stenosis in small nondominant circumflex artery (was read as occluded mid LAD and mid RCA on FFR but reviewed with Dr Meda Coffee and appears to not be occluded but rather not modeled on FFR due to small vessel size <1.65m.  Lexiscan Myoview on 08/24/2019 showed large perfusion defect in basal to apical inferior/inferoseptal, minimal reversibility, EF 42%.  Echocardiogram on 08/29/2019 showed LVEF 45 to 578% grade 1 diastolic dysfunction, RVSP 43 mmHg, moderate MR.  Echocardiogram 11/04/2020 showed EF 45 to 50%, normal RV function, moderate to severe MR, RVSP 41 mmHg.  Cardiac MRI on 12/16/2020 showed subendocardial LGE consistent with prior infarcts in basal to apical inferior, mid inferolateral, and apex with nonviable mid inferolateral, apical inferior, and apex;  EF 41%, normal RV, mild to moderate MR (regurgitant fraction 18%).  Cath on 12/30/2020 showed multivessel CAD (occluded mid RCA, 50% mid LAD, 70% OM, 20% proximal LCx), EF 45%, LVEDP 16 mmHg.  Medical therapy recommended.  Since last clinic visit,  she reports is doing well.  Denies any chest pain, dyspnea, lower extremity edema, or palpitations.  Reports occasional lightheadedness, denies any syncope.  Occurs when turning head certain ways.  She is taking DAPT, denies any bleeding issues.  She is doing water aerobics 3 times weekly for 2 hours, no exertional symptoms.   BP Readings from Last 3 Encounters:  10/21/21 (!) 118/56  10/05/21 122/71  07/24/21 (!) 104/58    Past Medical History:  Diagnosis Date   Arthritis    Complication of anesthesia    woke up during colonoscopy   Coronary artery disease    Hyperlipidemia    Hypertension    Insomnia    Sleep apnea     Past Surgical History:  Procedure Laterality Date   ABDOMINAL AORTOGRAM W/LOWER EXTREMITY Bilateral 05/15/2019   Procedure: ABDOMINAL AORTOGRAM W/LOWER EXTREMITY;  Surgeon: BSerafina Mitchell MD;  [facility];  Service: Cardiovascular;  Laterality: Bilateral;   ABDOMINAL AORTOGRAM W/LOWER EXTREMITY N/A 08/26/2020   Procedure: ABDOMINAL AORTOGRAM W/LOWER EXTREMITY;  Surgeon: BSerafina Mitchell MD;  [facility];  Service: Cardiovascular;  Laterality: N/A;   CATARACT EXTRACTION, BILATERAL  12/2015;01/2016   [facility]   COLONOSCOPY     ENDARTERECTOMY Left 03/13/2021  Procedure: LEFT CAROTID ENDARTERECTOMY;  Surgeon: Serafina Mitchell, MD;  Location: [facility] OR;  Service: Vascular;  Laterality: Left;   ENDARTERECTOMY FEMORAL Bilateral 09/13/2019   Procedure: BILATERAL FEMORAL ENDARTERECTOMY with Patch Angioplasty;  Surgeon: Serafina Mitchell, MD;  Location: [place];  Service: Vascular;  Laterality: Bilateral;   INSERTION OF ILIAC STENT Left 09/13/2019   Procedure: INSERTION OF LEFT  ILIAC STENT;  Surgeon: Serafina Mitchell, MD;  Location: [place];  Service: Vascular;  Laterality: Left;   LEFT HEART CATH AND CORONARY ANGIOGRAPHY N/A 12/30/2020   Procedure: LEFT HEART CATH AND CORONARY ANGIOGRAPHY;  Surgeon: Troy Sine, MD;  Location: [place] [facility];  Service: Cardiovascular;  Laterality: N/A;   PATCH ANGIOPLASTY Bilateral 09/13/2019   Procedure: PATCH ANGIOPLASTY Bilateral;  Surgeon: Serafina Mitchell, MD;  Location: [facility] OR;  Service: Vascular;  Laterality: Bilateral;   PERIPHERAL VASCULAR INTERVENTION Right 08/26/2020   Procedure: PERIPHERAL VASCULAR INTERVENTION;  Surgeon: Serafina Mitchell, MD;  Location: [place] [facility];  Service: Cardiovascular;  Laterality: Right;  femoral popliteal artery   TUBAL LIGATION  1969    Current Medications: Current Meds  Medication Sig   alendronate (FOSAMAX) 70 MG tablet Take 70 mg by mouth every Monday. Take with a full glass of water on an empty stomach.   aspirin EC 81 MG tablet Take 1 tablet (81 mg total) by mouth daily.   carvedilol (COREG) 3.125 MG tablet TAKE 1 TABLET BY MOUTH 2 TIMES DAILY.   Cholecalciferol (VITAMIN D3) 50 MCG (2000 UT) CAPS Take 2,000 Units by mouth daily.   clopidogrel (PLAVIX) 75 MG tablet TAKE 1 TABLET BY MOUTH EVERY DAY   empagliflozin (JARDIANCE) 10 MG TABS tablet TAKE 1 TABLET BY MOUTH DAILY BEFORE BREAKFAST.   ezetimibe (ZETIA) 10 MG tablet Take 1 tablet (10 mg total) by mouth daily.   losartan (COZAAR) 25 MG tablet 1 tablet Orally Once a day for 90 days   rosuvastatin (CRESTOR) 40 MG tablet Take 1 tablet (40 mg total) by mouth daily.     Allergies:   Codeine   Social History   Socioeconomic History   Marital status: Divorced    Spouse name: Not on file   Number of children: 1   Years of education: 12   Highest education level: Not on file  Occupational History    Comment: retired Scientist, clinical (histocompatibility and immunogenetics), Sport and exercise psychologist  Tobacco Use   Smoking status: Former    Packs/day: 1.00    Years: 36.00    Total pack  years: 36.00    Types: Cigarettes    Quit date: 04/01/2000    Years since quitting: 21.5   Smokeless tobacco: Never  Vaping Use   Vaping Use: Never used  Substance and Sexual Activity   Alcohol use: Yes    Comment: 1 glass wine per week   Drug use: No   Sexual activity: Not on file  Other Topics Concern   Not on file  Social History Narrative   Lives alone   Caffeine - coffee, 1 cup daily; maybe 4 teas per week   Right handed   Social Determinants of Health   Financial Resource Strain: Not on file  Food Insecurity: Not on file  Transportation Needs: Not on file  Physical Activity: Not on file  Stress: Not on file  Social Connections: Not on file     Family History: The patient's family history includes Anxiety disorder in her brother; Arthritis in her mother; Breast cancer in her cousin;  CVA in her mother; Cancer in her father; Diverticulitis in her mother; Diverticulosis in her sister; Healthy in her daughter; Hypertension in her brother and sister; Kidney Stones in her brother; Stroke in her mother; Suicidality in her brother; Ulcers in her brother.  ROS:   Please see the history of present illness.      All other systems reviewed and are negative.  EKGs/Labs/Other Studies Reviewed:    The following studies were reviewed today:   EKG:   06/06/2019: sinus rhythm, PVC, nonspecific T wave flattening, less than 1 mm ST depression in V6 07/13/2019: EKG was not ordered. 08/07/2019: EKG was not ordered. 05/14/2020: Sinus rhythm. Rate: 55 bpm. Q waves in V1/V2. Less than 1 mm ST depression in leads I, II, V5 and V6.  11/14/20: Sinus rhythm, rate 62, Q waves in V1/2, T wave inversions in leads III, aVF, less than 1 mm ST depression in leads V5/6  Recent Labs: 03/10/2021: ALT 16 03/14/2021: Hemoglobin 9.0; Platelets 170 04/22/2021: BUN 17; Creatinine, Ser 1.27; Magnesium 2.4; Potassium 5.1; Sodium 138  Recent Lipid Panel    Component Value Date/Time   CHOL 100 03/14/2021 0357    CHOL 178 01/14/2021 0825   TRIG 59 03/14/2021 0357   HDL 55 03/14/2021 0357   HDL 86 01/14/2021 0825   CHOLHDL 1.8 03/14/2021 0357   VLDL 12 03/14/2021 0357   LDLCALC 33 03/14/2021 0357   LDLCALC 78 01/14/2021 0825    Physical Exam:    VS:  BP (!) 118/56   Pulse (!) 55   Ht '4\' 11"'$  (1.499 m)   Wt 112 lb 6.4 oz (51 kg)   SpO2 97%   BMI 22.70 kg/m     Wt Readings from Last 3 Encounters:  10/21/21 112 lb 6.4 oz (51 kg)  10/05/21 108 lb (49 kg)  07/24/21 112 lb 3.2 oz (50.9 kg)     GEN:  in no acute distress HEENT: Normal NECK: No JVD; left carotid bruit CARDIAC: RRR, no murmurs, rubs, gallops RESPIRATORY:  Clear to auscultation without rales, wheezing or rhonchi  ABDOMEN: Soft, non-tender, non-distended MUSCULOSKELETAL:  No edema; No deformity  SKIN: Warm and dry NEUROLOGIC:  Alert and oriented x 3 PSYCHIATRIC:  Normal affect   ASSESSMENT:    1. Coronary artery disease involving native coronary artery of native heart without angina pectoris   2. Chronic systolic heart failure ([place])   3. Essential hypertension   4. PAD (peripheral artery disease) ([place])   5. Mitral valve insufficiency, unspecified etiology   6. Hyperlipidemia LDL goal <70      PLAN:    Coronary artery disease:Coronary CTA on 06/30/2019 showed calcium score 1503 (97 percentile).  CT FFR showed severe stenosis in small nondominant circumflex artery (was read as occluded mid LAD and mid RCA on FFR but reviewed with Dr Meda Coffee and appears to not be occluded but rather not modeled on FFR due to small vessel size <1.46m). Denies any anginal symptoms.  Lexiscan Myoview on 08/24/2019 showed large perfusion defect in basal to apical inferior/inferoseptal, minimal reversibility, EF 42%.  Echocardiogram on 08/29/2019 showed LVEF 45 to 522% grade 1 diastolic dysfunction, RVSP 43 mmHg, moderate MR. Echocardiogram 11/04/2020 showed EF 45 to 50%, normal RV function, moderate to severe MR, RVSP 41 mmHg.  Cardiac MRI on  12/16/2020 showed subendocardial LGE consistent with prior infarcts in basal to apical inferior, mid inferolateral, and apex with nonviable mid inferolateral, apical inferior, and apex; EF 41%, normal RV, mild to moderate MR (  regurgitant fraction 18%).  Cath on 12/30/2020 showed multivessel CAD (occluded mid RCA, 50% mid LAD, 70% OM, 20% proximal LCx), EF 45%, LVEDP 16 mmHg.  Medical therapy recommended.  She denies any anginal symptoms. -Continue aspirin 81 mg daily, Plavix 75 mg daily.  Check CBC -Continue rosuvastatin 40 mg daily, Zetia 10 mg daily  Chronic systolic heart failure: EF 45 to 50% on echocardiogram on 08/29/2019.  Stable at 45 to 50% on echo 11/04/2020.  EF 41% on CMR 12/16/2020.  Appears euvolemic. -Continue carvedilol 3.125 mg twice daily -Continue losartan 25 mg daily -Continue Jardiance 10 mg daily -Have held off on spironolactone given elevated potassium  Mitral regurgitation: Moderate on echocardiogram 08/29/2019.  Echocardiogram 11/04/2020 showed moderate to severe MR with eccentric posterior directed MR jet, could be due to restricted posterior leaflet, although some images suggested hockey-stick deformity of the anterior leaflet which could indicate possible rheumatic disease.   -No murmur on exam. Cardiac MRI shows mild to moderate MR (regurgitant fraction 18%)  PAD: Angiogram on 05/15/2019 showed bilateral femoral artery occlusion, bilateral near occlusive popliteal stenosis.  Status post bilateral femoral endarterectomy on 09/13/2019.  Underwent stent to right superficial femoral and popliteal artery on 08/26/2020.  Continue aspirin and rosuvastatin.  Hypertension: On carvedilol 3.25 mg twice daily and losartan 25 mg daily.  Appears controlled.  Hyperlipidemia: Continue rosuvastatin 40 mg daily and Zetia 10 mg daily, LDL 33 on 03/14/2021  Carotid stenosis: Status post left carotid endarterectomy 03/13/2021.  Continue ASA, plavix, statin  RTC in***  Medication  Adjustments/Labs and Tests Ordered: Current medicines are reviewed at length with the patient today.  Concerns regarding medicines are outlined above.  Orders Placed This Encounter  Procedures   Basic metabolic panel   Magnesium   TSH     No orders of the defined types were placed in this encounter.    Patient Instructions  Medication Instructions:  Your physician recommends that you continue on your current medications as directed. Please refer to the Current Medication list given to you today.  *If you need a refill on your cardiac medications before your next appointment, please call your pharmacy*   Lab Work: BMET, Mag, CBC today  If you have labs (blood work) drawn today and your tests are completely normal, you will receive your results only by: [place] (if you have MyChart) OR A paper copy in the mail If you have any lab test that is abnormal or we need to change your treatment, we will call you to review the results.  Follow-Up: At [facility], you and your health needs are our priority.  As part of our continuing mission to provide you with exceptional heart care, we have created designated Provider Care Teams.  These Care Teams include your primary Cardiologist (physician) and Advanced Practice Providers (APPs -  Physician Assistants and Nurse Practitioners) who all work together to provide you with the care you need, when you need it.  We recommend signing up for the patient portal called "MyChart".  Sign up information is provided on this After Visit Summary.  MyChart is used to connect with patients for Virtual Visits (Telemedicine).  Patients are able to view lab/test results, encounter notes, upcoming appointments, etc.  Non-urgent messages can be sent to your provider as well.   To learn more about what you can do with MyChart, go to [email or web address].    Your next appointment:   3 month(s)  The format for your next appointment:   In  Person  Provider:   Donato Heinz, MD               Signed, Donato Heinz, MD  10/21/2021 8:47 AM    [place]

## 2022-01-28 ENCOUNTER — Ambulatory Visit: Payer: Medicare HMO | Attending: Cardiology | Admitting: Cardiology

## 2022-01-28 ENCOUNTER — Encounter: Payer: Self-pay | Admitting: Cardiology

## 2022-01-28 VITALS — BP 132/66 | HR 68 | Ht 59.0 in | Wt 112.0 lb

## 2022-01-28 DIAGNOSIS — I5022 Chronic systolic (congestive) heart failure: Secondary | ICD-10-CM | POA: Diagnosis not present

## 2022-01-28 DIAGNOSIS — I2510 Atherosclerotic heart disease of native coronary artery without angina pectoris: Secondary | ICD-10-CM | POA: Diagnosis not present

## 2022-01-28 DIAGNOSIS — I10 Essential (primary) hypertension: Secondary | ICD-10-CM | POA: Diagnosis not present

## 2022-01-28 DIAGNOSIS — G4733 Obstructive sleep apnea (adult) (pediatric): Secondary | ICD-10-CM | POA: Diagnosis not present

## 2022-01-28 DIAGNOSIS — E785 Hyperlipidemia, unspecified: Secondary | ICD-10-CM | POA: Diagnosis not present

## 2022-01-28 DIAGNOSIS — I340 Nonrheumatic mitral (valve) insufficiency: Secondary | ICD-10-CM

## 2022-01-28 NOTE — Patient Instructions (Signed)
Medication Instructions:  Your physician recommends that you continue on your current medications as directed. Please refer to the Current Medication list given to you today.  *If you need a refill on your cardiac medications before your next appointment, please call your pharmacy*   Lab Work: BMET, Lipid today  If you have labs (blood work) drawn today and your tests are completely normal, you will receive your results only by: [place] (if you have MyChart) OR A paper copy in the mail If you have any lab test that is abnormal or we need to change your treatment, we will call you to review the results.   Testing/Procedures: Your physician has requested that you have an echocardiogram in 6 MONTHS. Echocardiography is a painless test that uses sound waves to create images of your heart. It provides your doctor with information about the size and shape of your heart and how well your heart's chambers and valves are working. This procedure takes approximately one hour. There are no restrictions for this procedure. Please do NOT wear cologne, perfume, aftershave, or lotions (deodorant is allowed). Please arrive 15 minutes prior to your appointment time.   Follow-Up: At [facility], you and your health needs are our priority.  As part of our continuing mission to provide you with exceptional heart care, we have created designated Provider Care Teams.  These Care Teams include your primary Cardiologist (physician) and Advanced Practice Providers (APPs -  Physician Assistants and Nurse Practitioners) who all work together to provide you with the care you need, when you need it.  We recommend signing up for the patient portal called "MyChart".  Sign up information is provided on this After Visit Summary.  MyChart is used to connect with patients for Virtual Visits (Telemedicine).  Patients are able to view lab/test results, encounter notes, upcoming appointments, etc.  Non-urgent  messages can be sent to your provider as well.   To learn more about what you can do with MyChart, go to [email or web address].    Your next appointment:   6 month(s)  Provider:   Donato Heinz, MD

## 2022-01-29 ENCOUNTER — Other Ambulatory Visit: Payer: Self-pay | Admitting: Cardiology

## 2022-01-29 ENCOUNTER — Other Ambulatory Visit: Payer: Self-pay | Admitting: *Deleted

## 2022-01-29 DIAGNOSIS — E870 Hyperosmolality and hypernatremia: Secondary | ICD-10-CM

## 2022-01-29 DIAGNOSIS — Z79899 Other long term (current) drug therapy: Secondary | ICD-10-CM

## 2022-01-29 DIAGNOSIS — E875 Hyperkalemia: Secondary | ICD-10-CM

## 2022-01-29 DIAGNOSIS — I5022 Chronic systolic (congestive) heart failure: Secondary | ICD-10-CM

## 2022-01-29 LAB — LIPID PANEL
Chol/HDL Ratio: 1.9 ratio (ref 0.0–4.4)
Cholesterol, Total: 150 mg/dL (ref 100–199)
HDL: 80 mg/dL (ref >39)
LDL Chol Calc (NIH): 56 mg/dL (ref 0–99)
Triglycerides: 69 mg/dL (ref 0–149)
VLDL Cholesterol Cal: 14 mg/dL (ref 5–40)

## 2022-01-29 LAB — BASIC METABOLIC PANEL
BUN/Creatinine Ratio: 10 — ABNORMAL LOW (ref 12–28)
BUN: 17 mg/dL (ref 8–27)
CO2: 24 mmol/L (ref 20–29)
Calcium: 10 mg/dL (ref 8.7–10.3)
Chloride: 107 mmol/L — ABNORMAL HIGH (ref 96–106)
Creatinine, Ser: 1.68 mg/dL — ABNORMAL HIGH (ref 0.57–1.00)
Glucose: 97 mg/dL (ref 70–99)
Potassium: 5.3 mmol/L — ABNORMAL HIGH (ref 3.5–5.2)
Sodium: 145 mmol/L — ABNORMAL HIGH (ref 134–144)
eGFR: 31 mL/min/{1.73_m2} — ABNORMAL LOW (ref >59)

## 2022-02-05 DIAGNOSIS — E870 Hyperosmolality and hypernatremia: Secondary | ICD-10-CM | POA: Diagnosis not present

## 2022-02-05 DIAGNOSIS — I5022 Chronic systolic (congestive) heart failure: Secondary | ICD-10-CM | POA: Diagnosis not present

## 2022-02-05 DIAGNOSIS — Z79899 Other long term (current) drug therapy: Secondary | ICD-10-CM | POA: Diagnosis not present

## 2022-02-05 DIAGNOSIS — E875 Hyperkalemia: Secondary | ICD-10-CM | POA: Diagnosis not present

## 2022-02-05 LAB — BASIC METABOLIC PANEL
BUN/Creatinine Ratio: 11 — ABNORMAL LOW (ref 12–28)
BUN: 15 mg/dL (ref 8–27)
CO2: 23 mmol/L (ref 20–29)
Calcium: 9.9 mg/dL (ref 8.7–10.3)
Chloride: 102 mmol/L (ref 96–106)
Creatinine, Ser: 1.34 mg/dL — ABNORMAL HIGH (ref 0.57–1.00)
Glucose: 117 mg/dL — ABNORMAL HIGH (ref 70–99)
Potassium: 4.4 mmol/L (ref 3.5–5.2)
Sodium: 136 mmol/L (ref 134–144)
eGFR: 41 mL/min/{1.73_m2} — ABNORMAL LOW (ref >59)

## 2022-02-23 ENCOUNTER — Encounter: Payer: Self-pay | Admitting: Cardiology

## 2022-02-24 DIAGNOSIS — H1031 Unspecified acute conjunctivitis, right eye: Secondary | ICD-10-CM | POA: Diagnosis not present

## 2022-02-24 DIAGNOSIS — H0100A Unspecified blepharitis right eye, upper and lower eyelids: Secondary | ICD-10-CM | POA: Diagnosis not present

## 2022-02-28 DIAGNOSIS — G4733 Obstructive sleep apnea (adult) (pediatric): Secondary | ICD-10-CM | POA: Diagnosis not present

## 2022-03-03 DIAGNOSIS — H10501 Unspecified blepharoconjunctivitis, right eye: Secondary | ICD-10-CM | POA: Diagnosis not present

## 2022-03-09 DIAGNOSIS — I10 Essential (primary) hypertension: Secondary | ICD-10-CM | POA: Diagnosis not present

## 2022-03-09 DIAGNOSIS — G4733 Obstructive sleep apnea (adult) (pediatric): Secondary | ICD-10-CM | POA: Diagnosis not present

## 2022-03-29 DIAGNOSIS — G4733 Obstructive sleep apnea (adult) (pediatric): Secondary | ICD-10-CM | POA: Diagnosis not present

## 2022-04-05 ENCOUNTER — Ambulatory Visit: Payer: Medicare HMO | Admitting: Neurology

## 2022-04-07 DIAGNOSIS — G4733 Obstructive sleep apnea (adult) (pediatric): Secondary | ICD-10-CM | POA: Diagnosis not present

## 2022-04-07 DIAGNOSIS — I10 Essential (primary) hypertension: Secondary | ICD-10-CM | POA: Diagnosis not present

## 2022-04-18 ENCOUNTER — Other Ambulatory Visit: Payer: Self-pay | Admitting: Cardiology

## 2022-04-20 DIAGNOSIS — H52223 Regular astigmatism, bilateral: Secondary | ICD-10-CM | POA: Diagnosis not present

## 2022-04-20 DIAGNOSIS — H524 Presbyopia: Secondary | ICD-10-CM | POA: Diagnosis not present

## 2022-04-23 ENCOUNTER — Encounter: Payer: Self-pay | Admitting: Cardiology

## 2022-04-23 MED ORDER — EMPAGLIFLOZIN 10 MG PO TABS: 10.0000 mg | ORAL_TABLET | Freq: Every day | ORAL | 9 refills | Status: DC

## 2022-04-28 ENCOUNTER — Ambulatory Visit: Payer: Medicare HMO | Admitting: Neurology

## 2022-04-28 ENCOUNTER — Encounter: Payer: Self-pay | Admitting: Neurology

## 2022-04-28 VITALS — BP 118/50 | HR 74 | Ht 59.0 in | Wt 110.4 lb

## 2022-04-28 DIAGNOSIS — G4733 Obstructive sleep apnea (adult) (pediatric): Secondary | ICD-10-CM | POA: Insufficient documentation

## 2022-04-28 NOTE — Patient Instructions (Signed)

## 2022-04-28 NOTE — Progress Notes (Signed)
SLEEP MEDICINE CLINIC    Provider:  Melvyn Novas, MD  Primary Care Physician:  Chilton Greathouse, MD [address] [place] [address]     Referring Provider: Chilton Greathouse, Md [address] [place],  [place] [address]          Chief Complaint according to patient   Patient presents with:     New Patient (Initial Visit)     Patient returns tof day  with new CPAP at 9 cm water.  Established CPAP Patient here with new CPAP,  had HST in 12-2021.  Patient states she is well and stable, no new concerns.      HISTORY OF PRESENT ILLNESS:  Helen Washington is a 77 y.o. female patient who is here for revisit 04/28/2022 for  follow up on her HST:  I had the pleasure of meeting with the patient several times over the last 7 years, her primary care physician is Dr. Vassie Loll and she had most recent clinic visit with Everlene Other nurse practitioner.  She has a history of carotid artery stenosis, coronary artery disease, congestive heart failure, peripheral arterial disease, herniated disc and a prior diagnosis of sleep apnea.  She has been using CPAP at 9 cm setting for many years and now needed a new machine and needed to return for a baseline 12-29-2021:  Her home sleep test showed 6 hours 52 minutes of sleep with about 22% REM sleep her AHI was that of mild apnea 10.7 AHI per hour but strongly REM sleep dependent.   Within REM sleep her AHI was 23.7 in non-REM sleep only 7.1/h.  There was no tachy-bradycardia noted, there was no prolonged oxygen desaturation seen, no central apneas emerged, most apnea was seen in supine sleep with an AHI of 17.1/h via nonsupine sleep was seen with an AHI of 6.5/h.  Snoring was just at threshold with a mean volume of 40 dB.  My colleague Dr. Frances Furbish interpreted the study on my behalf while I was on medical leave and reset a new CPAP machine to 9 cm water pressure with 3 cm EPR.  The patient continues to use this with great compliance.  She uses on average 6  hours 38 minutes each night and her residual AHI is 0.9/h.  She does have some air leakage which is noticeable.  She is using a nasal pillow interface. She needs Xs size pillows.      HPI:    Interval history from 05/27/2016, I have the pleasure of seeing Helen Washington today. The patient underwent a baseline polysomnography on 05/05/2016 and was diagnosed with mild sleep apnea all obstructive in origin at a frequency of 13.3 per hour of sleep. During REM sleep however her apnea exacerbated to 39.2, her oxygen nadir was low at only 75% saturation and the total desaturation time was very long at 202 minutes for a sleep study that only and calm past 430 minutes. Was for the constellation of findings that CPAP therapy was recommended. The patient has no history of a pulmonary disorder, never suffered pulmonary emboli, does not have COPD or asthma, is not on medication that would suppress her breathing drive. She reports a normal degree of sleepiness not excessive at 7 points on the Epworth score she's not fatigued in daytime at only 12 points of the fatigue severity scale and she certainly not depressed endorsing 0 points on a 15 point depression questionnaire.  She is concerned about the mask associated with CPAP  use. It is her impression that she will get a FFM- I took it upon myself to fit her with a Respironics pediatric size nasal mask, and an AirFit P 10 for which I only had S size inserts , not XS. She is concerned about having to logg it to [place] in July on a 10 day trip- I explained she can take a break.       Helen Washington is a 77 y.o. female , seen here as a referral  from Dr. Felipa EthAvva for a sleep consultation. Helen Washington is a 77 year old Caucasian female patient presents today after she has had multiple high blood pressure readings some at night and some in daytime without further explanation. Dr. Felipa EthAvva would  like to see if sleep apnea may be a cause.  The patient reports that she sometimes experiences  sleeplessness, tossing and turning not being able to enter sleep. But this is an exception.    Chief complaint according to patient : "I have no problems sleeping".    Sleep habits are as follows: She goes to her bedroom between 9 and 10 PM but she watches TV from there on until she feels ready to go to sleep. She set the TV on a timer of 60 minutes duration, and is usually asleep before that time is over. Most of the time she will stay asleep. He does not have nocturia , has no need to go to the bathroom at night. She usually wakes up at 4 AM spontaneously and she again will watch TV at that time until 6 AM when she gets up. After arising at 6 AM she has breakfast at home including coffee, and 3 mornings a week goes to the [facility]. She does 2 hours of water aerobics and the treadmill for 20 minutes. And she may swim for another half hour. She rarely experiences shortness of breath not doing exercises, she usually does not experience palpitations or diaphoresis at night, she does not wake up with headaches, not with a dry mouth. Once in a while  She has trouble sleeping and she may just take a 30 minute nap the following day.    Sleep medical history and family sleep history:  Social history: The patient is a former smoker but quit in 2001 . She had 30 pack years, she drinks only 1 caffeinated beverage a day, she does not use alcohol, she is very active ( retired) and she swims and walks. [employer] retiree, after 40 years.   Past medical history is positive for hyperlipidemia, periodic insomnia, a tubal ligation in 1969, cataract surgery in December 2017 and in January of this year, and a family history of cancer in her father and kidney stones, her mother lift through her 89th birthday, suffered a stroke, had hypertension and arthritis. The patient also has 3 brothers one died of natural causes in advanced age one of her brothers committed suicide. One sister is [phone] living with  hypertension her daughter is alive and well at 2151 she has 2 grandchildren.      Review of Systems: Out of a complete 14 system review, the patient complains of only the following symptoms, and all other reviewed systems are negative.:     Multiple artery disease, PAD, CAD, Carotid arteries.   This explains why her " foot goes to sleep" and the vascular claudication in both legs. Eg cramps.  Affecting  balance and gait.     OSA on CPAP.  How likely are you to doze in the following situations: 0 = not likely, 1 = slight chance, 2 = moderate chance, 3 = high chance   Sitting and Reading? Watching Television? Sitting inactive in a public place (theater or meeting)? As a passenger in a car for an hour without a break? Lying down in the afternoon when circumstances permit? Sitting and talking to someone? Sitting quietly after lunch without alcohol? In a car, while stopped for a few minutes in traffic?   Total = 11/ 24 points   FSS endorsed at 19/ 63 points.   GDS at 3/ 15 points.   The patient reports her older machine broke before she got the new one. Set up by January 2024  Social History   Socioeconomic History   Marital status: Divorced    Spouse name: Not on file   Number of children: 1   Years of education: 12   Highest education level: Not on file  Occupational History    Comment: retired Solicitor, Firefighter  Tobacco Use   Smoking status: Former    Packs/day: 1.00    Years: 36.00    Additional pack years: 0.00    Total pack years: 36.00    Types: Cigarettes    Quit date: 04/01/2000    Years since quitting: 22.0   Smokeless tobacco: Never  Vaping Use   Vaping Use: Never used  Substance and Sexual Activity   Alcohol use: Yes    Comment: 1 glass wine per week   Drug use: No   Sexual activity: Not on file  Other Topics Concern   Not on file  Social History Narrative   Lives alone   Caffeine - coffee, 1 cup daily; maybe 4 teas per week   Right handed   Social  Determinants of Health   Financial Resource Strain: Not on file  Food Insecurity: Not on file  Transportation Needs: Not on file  Physical Activity: Not on file  Stress: Not on file  Social Connections: Not on file    Family History  Problem Relation Age of Onset   Stroke Mother    Arthritis Mother    CVA Mother    Diverticulitis Mother    Cancer Father    Hypertension Sister    Diverticulosis Sister    Breast cancer Cousin    Suicidality Brother    Hypertension Brother    Anxiety disorder Brother    Kidney Stones Brother    Ulcers Brother    Healthy Daughter     Past Medical History:  Diagnosis Date   Arthritis    Complication of anesthesia    woke up during colonoscopy   Coronary artery disease    Hyperlipidemia    Hypertension    Insomnia    Sleep apnea     Past Surgical History:  Procedure Laterality Date   ABDOMINAL AORTOGRAM W/LOWER EXTREMITY Bilateral 05/15/2019   Procedure: ABDOMINAL AORTOGRAM W/LOWER EXTREMITY;  Surgeon: Nada Libman, MD;  [facility]: [facility];  Service: Cardiovascular;  Laterality: Bilateral;   ABDOMINAL AORTOGRAM W/LOWER EXTREMITY N/A 08/26/2020   Procedure: ABDOMINAL AORTOGRAM W/LOWER EXTREMITY;  Surgeon: Nada Libman, MD;  Location: [facility];  Service: Cardiovascular;  Laterality: N/A;   CATARACT EXTRACTION, BILATERAL  12/2015;01/2016   [facility] [place] [facility]   COLONOSCOPY     ENDARTERECTOMY Left 03/13/2021   Procedure: LEFT CAROTID ENDARTERECTOMY;  Surgeon: Nada Libman, MD;  Location: [facility] OR;  Service: Vascular;  Laterality: Left;   ENDARTERECTOMY FEMORAL Bilateral 09/13/2019   Procedure: BILATERAL FEMORAL ENDARTERECTOMY with Patch Angioplasty;  Surgeon: Nada Libman, MD;  Location: [facility];  Service: Vascular;  Laterality: Bilateral;   INSERTION OF ILIAC STENT Left 09/13/2019   Procedure: INSERTION OF LEFT ILIAC STENT;  Surgeon: Nada Libman, MD;  Location: [facility];  Service: Vascular;   Laterality: Left;   LEFT HEART CATH AND CORONARY ANGIOGRAPHY N/A 12/30/2020   Procedure: LEFT HEART CATH AND CORONARY ANGIOGRAPHY;  Surgeon: Lennette Bihari, MD;  [facility]: [facility];  Service: Cardiovascular;  Laterality: N/A;   PATCH ANGIOPLASTY Bilateral 09/13/2019   Procedure: PATCH ANGIOPLASTY Bilateral;  Surgeon: Nada Libman, MD;  Location: [facility] OR;  Service: Vascular;  Laterality: Bilateral;   PERIPHERAL VASCULAR INTERVENTION Right 08/26/2020   Procedure: PERIPHERAL VASCULAR INTERVENTION;  Surgeon: Nada Libman, MD;  [facility]: [facility];  Service: Cardiovascular;  Laterality: Right;  femoral popliteal artery   TUBAL LIGATION  1969     Current Outpatient Medications on File Prior to Visit  Medication Sig Dispense Refill   alendronate (FOSAMAX) 70 MG tablet Take 70 mg by mouth every Monday. Take with a full glass of water on an empty stomach.     aspirin EC 81 MG tablet Take 1 tablet (81 mg total) by mouth daily. 90 tablet 3   carvedilol (COREG) 3.125 MG tablet TAKE 1 TABLET BY MOUTH 2 TIMES DAILY. 180 tablet 3   Cholecalciferol (VITAMIN D3) 50 MCG (2000 UT) CAPS Take 2,000 Units by mouth daily.     clopidogrel (PLAVIX) 75 MG tablet TAKE 1 TABLET BY MOUTH EVERY DAY 90 tablet 3   empagliflozin (JARDIANCE) 10 MG TABS tablet Take 1 tablet (10 mg total) by mouth daily before breakfast. 30 tablet 9   ezetimibe (ZETIA) 10 MG tablet TAKE 1 TABLET BY MOUTH EVERY DAY 90 tablet 3   losartan (COZAAR) 25 MG tablet 1 tablet Orally Once a day for 90 days     rosuvastatin (CRESTOR) 40 MG tablet Take 1 tablet (40 mg total) by mouth daily. 90 tablet 3   No current facility-administered medications on file prior to visit.    Allergies  Allergen Reactions   Codeine Nausea Only     DIAGNOSTIC DATA (LABS, IMAGING, TESTING) - I reviewed patient records, labs, notes, testing and imaging myself where available.  Lab Results  Component Value Date   WBC 6.8 10/21/2021   HGB 12.4  10/21/2021   HCT 37.3 10/21/2021   MCV 94 10/21/2021   PLT 215 10/21/2021      Component Value Date/Time   NA 136 02/05/2022 1125   K 4.4 02/05/2022 1125   CL 102 02/05/2022 1125   CO2 23 02/05/2022 1125   GLUCOSE 117 (H) 02/05/2022 1125   GLUCOSE 110 (H) 03/14/2021 0357   BUN 15 02/05/2022 1125   CREATININE 1.34 (H) 02/05/2022 1125   CALCIUM 9.9 02/05/2022 1125   PROT 7.1 03/10/2021 0823   ALBUMIN 3.6 03/10/2021 0823   AST 25 03/10/2021 0823   ALT 16 03/10/2021 0823   ALKPHOS 60 03/10/2021 0823   BILITOT 0.6 03/10/2021 0823   GFRNONAA 36 (L) 03/14/2021 0357   GFRAA >60 09/14/2019 0229   Lab Results  Component Value Date   CHOL 150 01/28/2022   HDL 80 01/28/2022   LDLCALC 56 01/28/2022   TRIG 69 01/28/2022   CHOLHDL 1.9 01/28/2022   No results found for: "HGBA1C" No results found for: "  VITAMINB12" No results found for: "TSH"  PHYSICAL EXAM:  Today's Vitals   04/28/22 1527  BP: (!) 118/50  Pulse: 74  Weight: 110 lb 6.4 oz (50.1 kg)  Height: 4\' 11"  (1.499 m)   Body mass index is 22.3 kg/m.   Wt Readings from Last 3 Encounters:  04/28/22 110 lb 6.4 oz (50.1 kg)  01/28/22 112 lb (50.8 kg)  10/21/21 112 lb 6.4 oz (51 kg)     Ht Readings from Last 3 Encounters:  04/28/22 4\' 11"  (1.499 m)  01/28/22 4\' 11"  (1.499 m)  10/21/21 4\' 11"  (1.499 m)      General: The patient is awake, alert and appears not in acute distress. The patient is well groomed. Head: Normocephalic, atraumatic. Neck is supple. Mallampati 1  neck circumference: 13.5  . Nasal airflow partially obstructed.,  Prognathia. Upper dentition with implants.  Cardiovascular:  Regular rate and rhythm , without  murmurs or carotid bruit, and without distended neck veins. Respiratory: Lungs are clear to auscultation. Skin:  Without evidence of edema, or rash Trunk: BMI is 23. The patient's posture is erect    Neurologic exam : The patient is awake and alert, oriented to place and time.   Mood and  affect are appropriate.   Cranial nerves: Pupils are equal and briskly reactive to light.  Extraocular movements  in vertical and horizontal planes intact and without nystagmus. Visual fields by finger perimetry are intact. Hearing to finger rub intact.   Facial sensation intact to fine touch.  Facial motor , tongue and uvula move midline. Shoulder shrug was symmetrical.  Motor exam:  Symmetric bulk, tone and ROM.   Normal tone without cog wheeling, symmetric grip strength .   Sensory:  Proprioception tested in the upper extremities was normal.   Coordination: Rapid alternating movements in the fingers/hands were of normal speed.  The Finger-to-nose maneuver was intact without evidence of ataxia, dysmetria or tremor.   Gait and station: Patient could rise unassisted from a seated position, walked without assistive device.  She is walking slower than she used to, and her right foot will go to sleep, sometimes she has a feeling of both legs being  stretched.  Stance is of normal width/ base and the patient turned with 3 steps.  Toe and heel walk were deferred.  Deep tendon reflexes: in the  upper and lower extremities are symmetric and intact.  Babinski response was deferred.    ASSESSMENT AND PLAN 77 y.o. year old female  here with:    1)  established OSA treatment on CPAP at 9 cm setting, 3 cm EPR with nasal pillows in XS.  Doing well on her new CPAP machine and no changes needed. High compliance of 90%   RV : in 12 months    I plan to follow up either personally or through our NP within 12 months.   I would like to thank Chilton Greathouse, MD and Chilton Greathouse, Md [address] [place],  [place] [address] for allowing me to meet with and to take care of this pleasant patient.   CC: I will share my notes with PCP.  After spending a total time of  12  minutes face to face and additional time for physical and neurologic examination, review of laboratory studies,  personal  review of imaging studies, reports and results of other testing and review of referral information / records as far as provided in visit,   Electronically signed by: Melvyn Novas, MD 04/28/2022  4:05 PM  [facility] and [employer] certified by [employer] of Sleep Medicine and Diplomate of the [employer] of Sleep Medicine. Board certified In Neurology through the ABPN, Fellow of the [employer] of Neurology. Medical Director of [employer].

## 2022-04-29 DIAGNOSIS — G4733 Obstructive sleep apnea (adult) (pediatric): Secondary | ICD-10-CM | POA: Diagnosis not present

## 2022-05-08 DIAGNOSIS — G4733 Obstructive sleep apnea (adult) (pediatric): Secondary | ICD-10-CM | POA: Diagnosis not present

## 2022-05-08 DIAGNOSIS — I10 Essential (primary) hypertension: Secondary | ICD-10-CM | POA: Diagnosis not present

## 2022-05-17 ENCOUNTER — Telehealth: Payer: Self-pay | Admitting: *Deleted

## 2022-05-17 NOTE — Telephone Encounter (Signed)
[employer] patient assistance faxed to [employer]. 

## 2022-05-19 DIAGNOSIS — M62838 Other muscle spasm: Secondary | ICD-10-CM | POA: Diagnosis not present

## 2022-05-19 DIAGNOSIS — M542 Cervicalgia: Secondary | ICD-10-CM | POA: Diagnosis not present

## 2022-05-21 DIAGNOSIS — M542 Cervicalgia: Secondary | ICD-10-CM | POA: Diagnosis not present

## 2022-05-24 DIAGNOSIS — M542 Cervicalgia: Secondary | ICD-10-CM | POA: Diagnosis not present

## 2022-05-26 DIAGNOSIS — M542 Cervicalgia: Secondary | ICD-10-CM | POA: Diagnosis not present

## 2022-05-29 DIAGNOSIS — G4733 Obstructive sleep apnea (adult) (pediatric): Secondary | ICD-10-CM | POA: Diagnosis not present

## 2022-06-07 DIAGNOSIS — I10 Essential (primary) hypertension: Secondary | ICD-10-CM | POA: Diagnosis not present

## 2022-06-07 DIAGNOSIS — G4733 Obstructive sleep apnea (adult) (pediatric): Secondary | ICD-10-CM | POA: Diagnosis not present

## 2022-06-15 DIAGNOSIS — H26491 Other secondary cataract, right eye: Secondary | ICD-10-CM | POA: Diagnosis not present

## 2022-06-15 DIAGNOSIS — H31003 Unspecified chorioretinal scars, bilateral: Secondary | ICD-10-CM | POA: Diagnosis not present

## 2022-06-15 DIAGNOSIS — H5203 Hypermetropia, bilateral: Secondary | ICD-10-CM | POA: Diagnosis not present

## 2022-06-15 DIAGNOSIS — H52203 Unspecified astigmatism, bilateral: Secondary | ICD-10-CM | POA: Diagnosis not present

## 2022-06-28 DIAGNOSIS — G4733 Obstructive sleep apnea (adult) (pediatric): Secondary | ICD-10-CM | POA: Diagnosis not present

## 2022-07-01 ENCOUNTER — Other Ambulatory Visit: Payer: Self-pay | Admitting: Cardiology

## 2022-07-08 DIAGNOSIS — I10 Essential (primary) hypertension: Secondary | ICD-10-CM | POA: Diagnosis not present

## 2022-07-08 DIAGNOSIS — G4733 Obstructive sleep apnea (adult) (pediatric): Secondary | ICD-10-CM | POA: Diagnosis not present

## 2022-07-12 DIAGNOSIS — Z008 Encounter for other general examination: Secondary | ICD-10-CM | POA: Diagnosis not present

## 2022-07-12 DIAGNOSIS — I130 Hypertensive heart and chronic kidney disease with heart failure and stage 1 through stage 4 chronic kidney disease, or unspecified chronic kidney disease: Secondary | ICD-10-CM | POA: Diagnosis not present

## 2022-07-12 DIAGNOSIS — M810 Age-related osteoporosis without current pathological fracture: Secondary | ICD-10-CM | POA: Diagnosis not present

## 2022-07-12 DIAGNOSIS — M1990 Unspecified osteoarthritis, unspecified site: Secondary | ICD-10-CM | POA: Diagnosis not present

## 2022-07-12 DIAGNOSIS — I252 Old myocardial infarction: Secondary | ICD-10-CM | POA: Diagnosis not present

## 2022-07-12 DIAGNOSIS — E785 Hyperlipidemia, unspecified: Secondary | ICD-10-CM | POA: Diagnosis not present

## 2022-07-12 DIAGNOSIS — Z8249 Family history of ischemic heart disease and other diseases of the circulatory system: Secondary | ICD-10-CM | POA: Diagnosis not present

## 2022-07-12 DIAGNOSIS — N1832 Chronic kidney disease, stage 3b: Secondary | ICD-10-CM | POA: Diagnosis not present

## 2022-07-12 DIAGNOSIS — G4733 Obstructive sleep apnea (adult) (pediatric): Secondary | ICD-10-CM | POA: Diagnosis not present

## 2022-07-12 DIAGNOSIS — I509 Heart failure, unspecified: Secondary | ICD-10-CM | POA: Diagnosis not present

## 2022-07-12 DIAGNOSIS — Z87891 Personal history of nicotine dependence: Secondary | ICD-10-CM | POA: Diagnosis not present

## 2022-07-12 DIAGNOSIS — I2510 Atherosclerotic heart disease of native coronary artery without angina pectoris: Secondary | ICD-10-CM | POA: Diagnosis not present

## 2022-07-12 DIAGNOSIS — I255 Ischemic cardiomyopathy: Secondary | ICD-10-CM | POA: Diagnosis not present

## 2022-07-16 ENCOUNTER — Other Ambulatory Visit: Payer: Self-pay | Admitting: Vascular Surgery

## 2022-07-27 ENCOUNTER — Ambulatory Visit (HOSPITAL_COMMUNITY): Payer: Medicare HMO | Attending: Cardiology

## 2022-07-27 DIAGNOSIS — I5022 Chronic systolic (congestive) heart failure: Secondary | ICD-10-CM | POA: Diagnosis not present

## 2022-07-27 LAB — ECHOCARDIOGRAM COMPLETE
Area-P 1/2: 3.06 cm2
S' Lateral: 3 cm

## 2022-07-28 ENCOUNTER — Encounter: Payer: Self-pay | Admitting: Cardiology

## 2022-07-28 ENCOUNTER — Telehealth: Payer: Self-pay | Admitting: Adult Health

## 2022-07-28 DIAGNOSIS — G4733 Obstructive sleep apnea (adult) (pediatric): Secondary | ICD-10-CM | POA: Diagnosis not present

## 2022-07-28 NOTE — Telephone Encounter (Signed)
LVM and sent mychart msg informing pt of need to reschedule 10/14/22 and 05/03/23 appts - NP out

## 2022-07-30 DIAGNOSIS — N1831 Chronic kidney disease, stage 3a: Secondary | ICD-10-CM | POA: Diagnosis not present

## 2022-07-30 DIAGNOSIS — I110 Hypertensive heart disease with heart failure: Secondary | ICD-10-CM | POA: Diagnosis not present

## 2022-07-30 DIAGNOSIS — E785 Hyperlipidemia, unspecified: Secondary | ICD-10-CM | POA: Diagnosis not present

## 2022-07-30 DIAGNOSIS — M810 Age-related osteoporosis without current pathological fracture: Secondary | ICD-10-CM | POA: Diagnosis not present

## 2022-07-30 DIAGNOSIS — I2510 Atherosclerotic heart disease of native coronary artery without angina pectoris: Secondary | ICD-10-CM | POA: Diagnosis not present

## 2022-07-30 DIAGNOSIS — Z1212 Encounter for screening for malignant neoplasm of rectum: Secondary | ICD-10-CM | POA: Diagnosis not present

## 2022-08-06 DIAGNOSIS — I10 Essential (primary) hypertension: Secondary | ICD-10-CM | POA: Diagnosis not present

## 2022-08-06 DIAGNOSIS — Z1212 Encounter for screening for malignant neoplasm of rectum: Secondary | ICD-10-CM | POA: Diagnosis not present

## 2022-08-06 DIAGNOSIS — G4733 Obstructive sleep apnea (adult) (pediatric): Secondary | ICD-10-CM | POA: Diagnosis not present

## 2022-08-06 DIAGNOSIS — M1990 Unspecified osteoarthritis, unspecified site: Secondary | ICD-10-CM | POA: Diagnosis not present

## 2022-08-06 DIAGNOSIS — Z1331 Encounter for screening for depression: Secondary | ICD-10-CM | POA: Diagnosis not present

## 2022-08-06 DIAGNOSIS — R82998 Other abnormal findings in urine: Secondary | ICD-10-CM | POA: Diagnosis not present

## 2022-08-06 DIAGNOSIS — Z0000 Encounter for general adult medical examination without abnormal findings: Secondary | ICD-10-CM | POA: Diagnosis not present

## 2022-08-06 DIAGNOSIS — I700 Atherosclerosis of aorta: Secondary | ICD-10-CM | POA: Diagnosis not present

## 2022-08-06 DIAGNOSIS — K5904 Chronic idiopathic constipation: Secondary | ICD-10-CM | POA: Diagnosis not present

## 2022-08-06 DIAGNOSIS — E785 Hyperlipidemia, unspecified: Secondary | ICD-10-CM | POA: Diagnosis not present

## 2022-08-06 DIAGNOSIS — Z1339 Encounter for screening examination for other mental health and behavioral disorders: Secondary | ICD-10-CM | POA: Diagnosis not present

## 2022-08-06 DIAGNOSIS — I5022 Chronic systolic (congestive) heart failure: Secondary | ICD-10-CM | POA: Diagnosis not present

## 2022-08-06 DIAGNOSIS — N1831 Chronic kidney disease, stage 3a: Secondary | ICD-10-CM | POA: Diagnosis not present

## 2022-08-06 DIAGNOSIS — I739 Peripheral vascular disease, unspecified: Secondary | ICD-10-CM | POA: Diagnosis not present

## 2022-08-06 DIAGNOSIS — I130 Hypertensive heart and chronic kidney disease with heart failure and stage 1 through stage 4 chronic kidney disease, or unspecified chronic kidney disease: Secondary | ICD-10-CM | POA: Diagnosis not present

## 2022-08-06 DIAGNOSIS — M810 Age-related osteoporosis without current pathological fracture: Secondary | ICD-10-CM | POA: Diagnosis not present

## 2022-08-06 DIAGNOSIS — I2510 Atherosclerotic heart disease of native coronary artery without angina pectoris: Secondary | ICD-10-CM | POA: Diagnosis not present

## 2022-08-07 DIAGNOSIS — G4733 Obstructive sleep apnea (adult) (pediatric): Secondary | ICD-10-CM | POA: Diagnosis not present

## 2022-08-07 DIAGNOSIS — I10 Essential (primary) hypertension: Secondary | ICD-10-CM | POA: Diagnosis not present

## 2022-08-28 DIAGNOSIS — G4733 Obstructive sleep apnea (adult) (pediatric): Secondary | ICD-10-CM | POA: Diagnosis not present

## 2022-09-07 DIAGNOSIS — I10 Essential (primary) hypertension: Secondary | ICD-10-CM | POA: Diagnosis not present

## 2022-09-07 DIAGNOSIS — G4733 Obstructive sleep apnea (adult) (pediatric): Secondary | ICD-10-CM | POA: Diagnosis not present

## 2022-09-09 ENCOUNTER — Encounter: Payer: Self-pay | Admitting: Internal Medicine

## 2022-09-27 DIAGNOSIS — G4733 Obstructive sleep apnea (adult) (pediatric): Secondary | ICD-10-CM | POA: Diagnosis not present

## 2022-10-01 ENCOUNTER — Other Ambulatory Visit: Payer: Self-pay | Admitting: *Deleted

## 2022-10-01 DIAGNOSIS — I739 Peripheral vascular disease, unspecified: Secondary | ICD-10-CM

## 2022-10-01 DIAGNOSIS — I6523 Occlusion and stenosis of bilateral carotid arteries: Secondary | ICD-10-CM

## 2022-10-01 DIAGNOSIS — I70213 Atherosclerosis of native arteries of extremities with intermittent claudication, bilateral legs: Secondary | ICD-10-CM

## 2022-10-08 DIAGNOSIS — I10 Essential (primary) hypertension: Secondary | ICD-10-CM | POA: Diagnosis not present

## 2022-10-08 DIAGNOSIS — G4733 Obstructive sleep apnea (adult) (pediatric): Secondary | ICD-10-CM | POA: Diagnosis not present

## 2022-10-09 ENCOUNTER — Other Ambulatory Visit: Payer: Self-pay | Admitting: Cardiology

## 2022-10-11 ENCOUNTER — Ambulatory Visit (HOSPITAL_COMMUNITY)
Admission: RE | Admit: 2022-10-11 | Discharge: 2022-10-11 | Disposition: A | Discharge status: Home / Self Care | Payer: Medicare HMO | Source: Ambulatory Visit | Attending: Surgery | Admitting: Surgery

## 2022-10-11 ENCOUNTER — Encounter: Payer: Self-pay | Admitting: Surgery

## 2022-10-11 ENCOUNTER — Ambulatory Visit: Payer: Medicare HMO | Admitting: Surgery

## 2022-10-11 ENCOUNTER — Ambulatory Visit (INDEPENDENT_AMBULATORY_CARE_PROVIDER_SITE_OTHER)
Admission: RE | Admit: 2022-10-11 | Discharge: 2022-10-11 | Disposition: A | Discharge status: Home / Self Care | Payer: Medicare HMO | Source: Ambulatory Visit | Attending: Surgery | Admitting: Surgery

## 2022-10-11 VITALS — BP 124/67 | HR 77 | Temp 97.7°F | Resp 16 | Ht 59.0 in | Wt 105.0 lb

## 2022-10-11 DIAGNOSIS — I739 Peripheral vascular disease, unspecified: Secondary | ICD-10-CM

## 2022-10-11 DIAGNOSIS — I6523 Occlusion and stenosis of bilateral carotid arteries: Secondary | ICD-10-CM | POA: Diagnosis not present

## 2022-10-11 DIAGNOSIS — I70213 Atherosclerosis of native arteries of extremities with intermittent claudication, bilateral legs: Secondary | ICD-10-CM

## 2022-10-11 LAB — VAS US ABI WITH/WO TBI
Left ABI: 0.71
Right ABI: 1.09

## 2022-10-11 NOTE — Progress Notes (Signed)
Vascular and Vein Specialist of [facility]  Patient name: Helen Washington MRN: [id number] DOB: 1945/04/02 Sex: female   REASON FOR VISIT:    Follow up  HISOTRY OF PRESENT ILLNESS:    Helen Washington is a 77 y.o. female who suffers from bilateral claudication.  On 09/13/2019, she underwent bilateral femoral endarterectomy with patch angioplasty using a bovine patch.  Simultaneously she had a left external iliac stent placed.  She had recurrence of her symptoms.  She subsequently underwent stenting of her right superficial femoral artery on 08/26/2020 for a 90% right superficial femoral artery lesion and a 90% popliteal lesion.  She has a short segment left popliteal occlusion which has not been addressed.   She has no complaints today.  She will occasionally get left calf pain when walking long distances, but she does not do this on a regular basis.  Other than that, she does not have any other leg complaints.  She has no open wounds.   She was found to have greater than 80% left carotid stenosis which was asymptomatic.  She underwent left carotid endarterectomy with bovine pericardial patch angioplasty on 03/13/2021 for asymptomatic left carotid stenosis.  Intraoperative findings included a heavily calcified 80% stenosis.   The patient suffers from white coat hypertension.  she takes a statin for hypercholesterolemia.  The patient is a former smoker  PAST MEDICAL HISTORY:   Past Medical History:  Diagnosis Date   Arthritis    Complication of anesthesia    woke up during colonoscopy   Coronary artery disease    Hyperlipidemia    Hypertension    Insomnia    Sleep apnea      FAMILY HISTORY:   Family History  Problem Relation Age of Onset   Stroke Mother    Arthritis Mother    CVA Mother    Diverticulitis Mother    Cancer Father    Hypertension Sister    Diverticulosis Sister    Breast cancer Cousin    Suicidality Brother    Hypertension Brother     Anxiety disorder Brother    Kidney Stones Brother    Ulcers Brother    Healthy Daughter     SOCIAL HISTORY:   Social History   Tobacco Use   Smoking status: Former    Current packs/day: 0.00    Average packs/day: 1 pack/day for 36.0 years (36.0 ttl pk-yrs)    Types: Cigarettes    Start date: 04/01/1964    Quit date: 04/01/2000    Years since quitting: 22.5   Smokeless tobacco: Never  Substance Use Topics   Alcohol use: Yes    Comment: 1 glass wine per week     ALLERGIES:   Allergies  Allergen Reactions   Codeine Nausea Only     CURRENT MEDICATIONS:   Current Outpatient Medications  Medication Sig Dispense Refill   alendronate (FOSAMAX) 70 MG tablet Take 70 mg by mouth every Monday. Take with a full glass of water on an empty stomach.     aspirin EC 81 MG tablet Take 1 tablet (81 mg total) by mouth daily. 90 tablet 3   carvedilol (COREG) 3.125 MG tablet TAKE 1 TABLET BY MOUTH TWICE A DAY 180 tablet 3   Cholecalciferol (VITAMIN D3) 50 MCG (2000 UT) CAPS Take 2,000 Units by mouth daily.     clopidogrel (PLAVIX) 75 MG tablet TAKE 1 TABLET BY MOUTH EVERY DAY 90 tablet 3   empagliflozin (JARDIANCE) 10 MG TABS tablet Take 1 tablet (  10 mg total) by mouth daily before breakfast. 30 tablet 9   ezetimibe (ZETIA) 10 MG tablet TAKE 1 TABLET BY MOUTH EVERY DAY 90 tablet 3   losartan (COZAAR) 25 MG tablet 1 tablet Orally Once a day for 90 days     rosuvastatin (CRESTOR) 40 MG tablet Take 1 tablet (40 mg total) by mouth daily. 90 tablet 3   No current facility-administered medications for this visit.    REVIEW OF SYSTEMS:   [X]  denotes positive finding, [ ]  denotes negative finding Cardiac  Comments:  Chest pain or chest pressure:    Shortness of breath upon exertion:    Short of breath when lying flat:    Irregular heart rhythm:        Vascular    Pain in calf, thigh, or hip brought on by ambulation: x   Pain in feet at night that wakes you up from your sleep:      Blood clot in your veins:    Leg swelling:         Pulmonary    Oxygen at home:    Productive cough:     Wheezing:         Neurologic    Sudden weakness in arms or legs:     Sudden numbness in arms or legs:     Sudden onset of difficulty speaking or slurred speech:    Temporary loss of vision in one eye:     Problems with dizziness:         Gastrointestinal    Blood in stool:     Vomited blood:         Genitourinary    Burning when urinating:     Blood in urine:        Psychiatric    Major depression:         Hematologic    Bleeding problems:    Problems with blood clotting too easily:        Skin    Rashes or ulcers:        Constitutional    Fever or chills:      PHYSICAL EXAM:   Vitals:   10/11/22 0849  BP: 124/67  Pulse: 77  Resp: 16  Temp: 97.7 F (36.5 C)  TempSrc: Temporal  SpO2: 94%  Weight: 105 lb (47.6 kg)  Height: 4\' 11"  (1.499 m)    GENERAL: The patient is a well-nourished female, in no acute distress. The vital signs are documented above. CARDIAC: There is a regular rate and rhythm.  VASCULAR: Palpable right pedal pulse, nonpalpable left PULMONARY: Non-labored respirations ABDOMEN: Soft and non-tender with normal pitched bowel sounds.  MUSCULOSKELETAL: There are no major deformities or cyanosis. NEUROLOGIC: No focal weakness or paresthesias are detected. SKIN: There are no ulcers or rashes noted. PSYCHIATRIC: The patient has a normal affect.  STUDIES:   I have reviewed the following: ABI/TBIToday's ABIToday's TBIPrevious ABIPrevious TBI  +-------+-----------+-----------+------------+------------+  Right 1.09       0.54       1.06        0.55          +-------+-----------+-----------+------------+------------+  Left  0.71       0.37       0.57        0.34          +-------+-----------+-----------+------------+------------+   Stenosis: +-------------------+-----------+  Location           Stent         +-------------------+-----------+  Left External Iliacno stenosis  +-------------------+-----------+   +---------------+--------+---------------+--------+--------+  pop           PSV cm/sStenosis       WaveformComments  +---------------+--------+---------------+--------+--------+  Prox to Stent  98                                       +---------------+--------+---------------+--------+--------+  Proximal Stent 240     50-99% stenosis                  +---------------+--------+---------------+--------+--------+  Mid Stent      201                                      +---------------+--------+---------------+--------+--------+  Distal Stent   145                                      +---------------+--------+---------------+--------+--------+  Distal to Stent86                                       +---------------+--------+---------------+--------+--------+     Right Carotid: Velocities in the right ICA are consistent with a 1-39%  stenosis.   Left Carotid: Velocities in the left ICA are consistent with a 1-39%  stenosis.   Vertebrals: Bilateral vertebral arteries demonstrate antegrade flow.  Subclavians: Normal flow hemodynamics were seen in bilateral subclavian               arteries.   MEDICAL ISSUES:   PAD: She has a palpable pulse on the right with no change in her ABIs.  There is a slight velocity elevation in the right SFA stent with 240 cm/s.  This does not require attention until it is at least greater than 300 cm/s or if she has symptoms.  I will have her follow-up in 6 months to reevaluate this area  Carotid: No significant cyst.  She will need a carotid duplex in 1 year.  This will need to be ordered at her next clinic visit in 6 months    Durene Cal, IV, MD, FACS [facility] Specialists of [facility] [phone] Pager [phone]

## 2022-10-14 ENCOUNTER — Ambulatory Visit: Payer: Medicare HMO | Admitting: Adult Health

## 2022-10-22 ENCOUNTER — Other Ambulatory Visit: Payer: Self-pay

## 2022-10-22 DIAGNOSIS — I739 Peripheral vascular disease, unspecified: Secondary | ICD-10-CM

## 2022-10-27 DIAGNOSIS — G4733 Obstructive sleep apnea (adult) (pediatric): Secondary | ICD-10-CM | POA: Diagnosis not present

## 2022-11-07 DIAGNOSIS — I10 Essential (primary) hypertension: Secondary | ICD-10-CM | POA: Diagnosis not present

## 2022-11-07 DIAGNOSIS — G4733 Obstructive sleep apnea (adult) (pediatric): Secondary | ICD-10-CM | POA: Diagnosis not present

## 2022-11-27 DIAGNOSIS — G4733 Obstructive sleep apnea (adult) (pediatric): Secondary | ICD-10-CM | POA: Diagnosis not present

## 2022-11-30 ENCOUNTER — Other Ambulatory Visit: Payer: Self-pay | Admitting: Internal Medicine

## 2022-11-30 DIAGNOSIS — Z1231 Encounter for screening mammogram for malignant neoplasm of breast: Secondary | ICD-10-CM

## 2022-12-08 DIAGNOSIS — I10 Essential (primary) hypertension: Secondary | ICD-10-CM | POA: Diagnosis not present

## 2022-12-08 DIAGNOSIS — G4733 Obstructive sleep apnea (adult) (pediatric): Secondary | ICD-10-CM | POA: Diagnosis not present

## 2022-12-30 ENCOUNTER — Ambulatory Visit
Admission: RE | Admit: 2022-12-30 | Discharge: 2022-12-30 | Disposition: A | Discharge status: Home / Self Care | Payer: Medicare HMO | Source: Ambulatory Visit

## 2022-12-30 DIAGNOSIS — Z1231 Encounter for screening mammogram for malignant neoplasm of breast: Secondary | ICD-10-CM

## 2023-01-05 ENCOUNTER — Encounter (HOSPITAL_COMMUNITY): Payer: Self-pay

## 2023-01-05 ENCOUNTER — Encounter (HOSPITAL_COMMUNITY): Payer: Medicare HMO

## 2023-01-10 ENCOUNTER — Other Ambulatory Visit: Payer: Self-pay | Admitting: Cardiology

## 2023-01-20 ENCOUNTER — Ambulatory Visit
Admission: RE | Admit: 2023-01-20 | Discharge: 2023-01-20 | Disposition: A | Discharge status: Home / Self Care | Payer: Medicare HMO | Source: Ambulatory Visit | Attending: Internal Medicine | Admitting: Internal Medicine

## 2023-01-20 DIAGNOSIS — Z1231 Encounter for screening mammogram for malignant neoplasm of breast: Secondary | ICD-10-CM | POA: Diagnosis not present

## 2023-01-25 ENCOUNTER — Telehealth: Payer: Self-pay | Admitting: Adult Health

## 2023-01-25 NOTE — Telephone Encounter (Signed)
 LVM and sent mychart msg informing pt of need to reschedule 05/09/23 appt - NP out

## 2023-01-26 NOTE — Telephone Encounter (Signed)
 Pt LVM at 9:58 am returning phone call to reschedule appt. I Pattricia Boss) LVM for patient to call the office to reschedule appt.

## 2023-01-27 NOTE — Telephone Encounter (Signed)
 Rescheduled appt on 05/24/23

## 2023-02-14 DIAGNOSIS — I5022 Chronic systolic (congestive) heart failure: Secondary | ICD-10-CM | POA: Diagnosis not present

## 2023-02-14 DIAGNOSIS — G4733 Obstructive sleep apnea (adult) (pediatric): Secondary | ICD-10-CM | POA: Diagnosis not present

## 2023-02-14 DIAGNOSIS — I700 Atherosclerosis of aorta: Secondary | ICD-10-CM | POA: Diagnosis not present

## 2023-02-14 DIAGNOSIS — N1831 Chronic kidney disease, stage 3a: Secondary | ICD-10-CM | POA: Diagnosis not present

## 2023-02-14 DIAGNOSIS — I110 Hypertensive heart disease with heart failure: Secondary | ICD-10-CM | POA: Diagnosis not present

## 2023-02-14 DIAGNOSIS — M810 Age-related osteoporosis without current pathological fracture: Secondary | ICD-10-CM | POA: Diagnosis not present

## 2023-02-14 DIAGNOSIS — E785 Hyperlipidemia, unspecified: Secondary | ICD-10-CM | POA: Diagnosis not present

## 2023-02-14 DIAGNOSIS — K5904 Chronic idiopathic constipation: Secondary | ICD-10-CM | POA: Diagnosis not present

## 2023-02-14 DIAGNOSIS — M1990 Unspecified osteoarthritis, unspecified site: Secondary | ICD-10-CM | POA: Diagnosis not present

## 2023-02-14 DIAGNOSIS — I739 Peripheral vascular disease, unspecified: Secondary | ICD-10-CM | POA: Diagnosis not present

## 2023-02-14 DIAGNOSIS — I2510 Atherosclerotic heart disease of native coronary artery without angina pectoris: Secondary | ICD-10-CM | POA: Diagnosis not present

## 2023-02-21 ENCOUNTER — Other Ambulatory Visit: Payer: Self-pay | Admitting: Cardiology

## 2023-03-21 ENCOUNTER — Other Ambulatory Visit: Payer: Self-pay | Admitting: Cardiology

## 2023-03-26 ENCOUNTER — Other Ambulatory Visit: Payer: Self-pay | Admitting: Cardiology

## 2023-03-26 DIAGNOSIS — G4733 Obstructive sleep apnea (adult) (pediatric): Secondary | ICD-10-CM | POA: Diagnosis not present

## 2023-04-11 ENCOUNTER — Ambulatory Visit (INDEPENDENT_AMBULATORY_CARE_PROVIDER_SITE_OTHER): Payer: Medicare HMO | Admitting: Physician Assistant

## 2023-04-11 ENCOUNTER — Encounter: Payer: Self-pay | Admitting: Physician Assistant

## 2023-04-11 ENCOUNTER — Ambulatory Visit (HOSPITAL_COMMUNITY)
Admission: RE | Admit: 2023-04-11 | Discharge: 2023-04-11 | Disposition: A | Discharge status: Home / Self Care | Payer: Medicare HMO | Source: Ambulatory Visit | Attending: Surgery | Admitting: Surgery

## 2023-04-11 ENCOUNTER — Ambulatory Visit (INDEPENDENT_AMBULATORY_CARE_PROVIDER_SITE_OTHER)
Admission: RE | Admit: 2023-04-11 | Discharge: 2023-04-11 | Disposition: A | Discharge status: Home / Self Care | Payer: Medicare HMO | Source: Ambulatory Visit | Attending: Surgery | Admitting: Surgery

## 2023-04-11 VITALS — BP 152/75 | HR 65 | Temp 98.0°F | Ht 59.0 in | Wt 111.6 lb

## 2023-04-11 DIAGNOSIS — I739 Peripheral vascular disease, unspecified: Secondary | ICD-10-CM

## 2023-04-11 DIAGNOSIS — I6523 Occlusion and stenosis of bilateral carotid arteries: Secondary | ICD-10-CM

## 2023-04-11 DIAGNOSIS — I70213 Atherosclerosis of native arteries of extremities with intermittent claudication, bilateral legs: Secondary | ICD-10-CM

## 2023-04-11 LAB — VAS US ABI WITH/WO TBI
Left ABI: 0.76
Right ABI: 1.16

## 2023-04-11 NOTE — Progress Notes (Unsigned)
 Office Note   History of Present Illness   Helen Washington is a 78 y.o. (1945/08/20) female who presents for surveillance of PAD. They have a history of ***  The patient returns today for follow up. He/she denies any recent medical history changes. The patient also denies any claudication, rest pain, or tissue loss of the lower extremities.  Current Outpatient Medications  Medication Sig Dispense Refill   aspirin EC 81 MG tablet Take 1 tablet (81 mg total) by mouth daily. 90 tablet 3   carvedilol (COREG) 3.125 MG tablet TAKE 1 TABLET BY MOUTH TWICE A DAY 180 tablet 3   Cholecalciferol (VITAMIN D3) 50 MCG (2000 UT) CAPS Take 2,000 Units by mouth daily.     clopidogrel (PLAVIX) 75 MG tablet TAKE 1 TABLET BY MOUTH EVERY DAY 90 tablet 3   empagliflozin (JARDIANCE) 10 MG TABS tablet TAKE 1 TABLET BY MOUTH EVERY DAY BEFORE BREAKFAST 15 tablet 0   ezetimibe (ZETIA) 10 MG tablet TAKE 1 TABLET BY MOUTH EVERY DAY 30 tablet 0   losartan (COZAAR) 25 MG tablet 1 tablet Orally Once a day for 90 days     rosuvastatin (CRESTOR) 40 MG tablet TAKE 1 TABLET BY MOUTH EVERY DAY 90 tablet 3   alendronate (FOSAMAX) 70 MG tablet Take 70 mg by mouth every Monday. Take with a full glass of water on an empty stomach. (Patient not taking: Reported on 04/11/2023)     No current facility-administered medications for this visit.    ***REVIEW OF SYSTEMS (negative unless checked):   Cardiac:  []  Chest pain or chest pressure? []  Shortness of breath upon activity? []  Shortness of breath when lying flat? []  Irregular heart rhythm?  Vascular:  []  Pain in calf, thigh, or hip brought on by walking? []  Pain in feet at night that wakes you up from your sleep? []  Blood clot in your veins? []  Leg swelling?  Pulmonary:  []  Oxygen at home? []  Productive cough? []  Wheezing?  Neurologic:  []  Sudden weakness in arms or legs? []  Sudden numbness in arms or legs? []  Sudden onset of difficult speaking or slurred  speech? []  Temporary loss of vision in one eye? []  Problems with dizziness?  Gastrointestinal:  []  Blood in stool? []  Vomited blood?  Genitourinary:  []  Burning when urinating? []  Blood in urine?  Psychiatric:  []  Major depression  Hematologic:  []  Bleeding problems? []  Problems with blood clotting?  Dermatologic:  []  Rashes or ulcers?  Constitutional:  []  Fever or chills?  Ear/Nose/Throat:  []  Change in hearing? []  Nose bleeds? []  Sore throat?  Musculoskeletal:  []  Back pain? []  Joint pain? []  Muscle pain?   Physical Examination   Vitals:   04/11/23 1508  BP: (!) 152/75  Pulse: 65  Temp: 98 F (36.7 C)  TempSrc: Temporal  SpO2: 94%  Weight: 111 lb 9.6 oz (50.6 kg)  Height: 4\' 11"  (1.499 m)   Body mass index is 22.54 kg/m.  General:  WDWN in NAD; vital signs documented above Gait: Not observed HENT: WNL, normocephalic Pulmonary: normal non-labored breathing , without rales, rhonchi,  wheezing Cardiac: regular Abdomen: soft, NT, no masses Skin: without rashes Vascular Exam/Pulses:  Extremities: {With/Without:20273} ischemic changes, {With/Without:20273} gangrene , {With/Without:20273} cellulitis; {With/Without:20273} open wounds;  Musculoskeletal: no muscle wasting or atrophy  Neurologic: A&O X 3;  No focal weakness or paresthesias are detected Psychiatric:  The pt has {Desc; normal/abnormal:11317::"Normal"} affect.  Non-Invasive Vascular imaging   ABI (04/11/2023) R:  ABI: 1.16 (  1.06) TBI:  0.42 L:  ABI: 0.76 (0.71),  TBI: 0.33  RLE Arterial Duplex (04/11/2023) RIGHT     PSV    RatioStenosis      WaveformComments                                  cm/s                                                              +----------+-------+-----+--------------+--------+-------------------------  ----+  CFA Distal163                       biphasic                                 +----------+-------+-----+--------------+--------+-------------------------  ----+  POP Prox  80                        biphasicstent present/poorly                                                        visualized                      +----------+-------+-----+--------------+--------+-------------------------  ----+  POP Mid   239         50-99%        biphasicstent present/poorly                                  stenosis              visualized                      +----------+-------+-----+--------------+--------+-------------------------  ----+  POP Distal88                        biphasic                                +----------+-------+-----+--------------+--------+-------------------------  ----+  TP Trunk  102                                                               +----------+-------+-----+--------------+--------+-------------------------  ----+       Right Stent(s):  +---------------+--------+--------+--------+--------+  SFA           PSV cm/sStenosisWaveformComments  +---------------+--------+--------+--------+--------+  Prox to Stent  109             biphasic          +---------------+--------+--------+--------+--------+  Proximal Stent 83  biphasic          +---------------+--------+--------+--------+--------+  Mid Stent      88              biphasic          +---------------+--------+--------+--------+--------+  Distal Stent   72              biphasic          +---------------+--------+--------+--------+--------+  Distal to Stent73              biphasic           Medical Decision Making   Helen Washington is a 77 y.o. female who presents for surveillance of PAD  Based on the patient's vascular studies, their ABIs are essentially unchanged since last visit. *** Arterial duplex *** The patient denies any claudication, rest pain, or tissue loss. They have  palpable/nonpalpable pedal pulses with *** doppler signals They will continue their *** and follow up with our office in *** months/year with ABIs and ***   Loel Dubonnet PA-C [facility] Office: [phone]  Clinic MD: ***

## 2023-04-23 ENCOUNTER — Other Ambulatory Visit: Payer: Self-pay | Admitting: Cardiology

## 2023-04-29 ENCOUNTER — Other Ambulatory Visit: Payer: Self-pay | Admitting: Cardiology

## 2023-04-29 ENCOUNTER — Other Ambulatory Visit: Payer: Self-pay | Admitting: Surgery

## 2023-04-30 ENCOUNTER — Encounter (HOSPITAL_BASED_OUTPATIENT_CLINIC_OR_DEPARTMENT_OTHER): Payer: Self-pay | Admitting: Cardiology

## 2023-05-02 MED ORDER — EZETIMIBE 10 MG PO TABS
10.0000 mg | ORAL_TABLET | Freq: Every day | ORAL | 0 refills | Status: DC
Start: 2023-05-02 — End: 2023-05-30

## 2023-05-03 ENCOUNTER — Ambulatory Visit: Payer: Medicare HMO | Admitting: Adult Health

## 2023-05-09 ENCOUNTER — Ambulatory Visit: Payer: Medicare HMO | Admitting: Adult Health

## 2023-05-23 NOTE — Progress Notes (Unsigned)
 Helen Washington

## 2023-05-24 ENCOUNTER — Encounter: Payer: Self-pay | Admitting: Adult Health

## 2023-05-24 ENCOUNTER — Ambulatory Visit: Payer: Medicare HMO | Admitting: Adult Health

## 2023-05-24 VITALS — BP 134/61 | HR 55 | Ht 59.0 in | Wt 111.4 lb

## 2023-05-24 DIAGNOSIS — G4733 Obstructive sleep apnea (adult) (pediatric): Secondary | ICD-10-CM | POA: Diagnosis not present

## 2023-05-24 NOTE — Progress Notes (Signed)
 PATIENT: Helen Washington DOB: Jun 04, 1945  REASON FOR VISIT: follow up HISTORY FROM: patient PRIMARY NEUROLOGIST: Dr. Albertina Hugger   Chief Complaint  Patient presents with   Follow-up    Rm 19, alone.  Cpap follow up.  No concerns.      HISTORY OF PRESENT ILLNESS: Today 05/24/23:  Helen Washington is a 78 y.o. female with a history of OSA on CPAP. Returns today for follow-up. Denies any issues. Reports that she is not sure she has noticed a difference. Reports that she uses it because we told her to use it. DL is below.        REVIEW OF SYSTEMS: Out of a complete 14 system review of symptoms, the patient complains only of the following symptoms, and all other reviewed systems are negative.   ESS 5  ALLERGIES: Allergies  Allergen Reactions   Codeine Nausea Only    HOME MEDICATIONS: Outpatient Medications Prior to Visit  Medication Sig Dispense Refill   alendronate  (FOSAMAX ) 70 MG tablet Take 70 mg by mouth every Monday. Take with a full glass of water on an empty stomach. (Patient not taking: Reported on 04/11/2023)     aspirin  EC 81 MG tablet Take 1 tablet (81 mg total) by mouth daily. 90 tablet 3   carvedilol  (COREG ) 3.125 MG tablet TAKE 1 TABLET BY MOUTH TWICE A DAY 30 tablet 0   Cholecalciferol (VITAMIN D3) 50 MCG (2000 UT) CAPS Take 2,000 Units by mouth daily.     clopidogrel  (PLAVIX ) 75 MG tablet TAKE 1 TABLET BY MOUTH EVERY DAY 90 tablet 3   empagliflozin  (JARDIANCE ) 10 MG TABS tablet TAKE 1 TABLET BY MOUTH EVERY DAY BEFORE BREAKFAST 15 tablet 0   ezetimibe  (ZETIA ) 10 MG tablet Take 1 tablet (10 mg total) by mouth daily. 30 tablet 0   losartan  (COZAAR ) 25 MG tablet 1 tablet Orally Once a day for 90 days     rosuvastatin  (CRESTOR ) 40 MG tablet TAKE 1 TABLET BY MOUTH EVERY DAY 90 tablet 3   No facility-administered medications prior to visit.    PAST MEDICAL HISTORY: Past Medical History:  Diagnosis Date   Arthritis    Complication of anesthesia    woke up during  colonoscopy   Coronary artery disease    Hyperlipidemia    Hypertension    Insomnia    Sleep apnea     PAST SURGICAL HISTORY: Past Surgical History:  Procedure Laterality Date   ABDOMINAL AORTOGRAM W/LOWER EXTREMITY Bilateral 05/15/2019   Procedure: ABDOMINAL AORTOGRAM W/LOWER EXTREMITY;  Surgeon: Margherita Shell, MD;  Location: [facility];  Service: Cardiovascular;  Laterality: Bilateral;   ABDOMINAL AORTOGRAM W/LOWER EXTREMITY N/A 08/26/2020   Procedure: ABDOMINAL AORTOGRAM W/LOWER EXTREMITY;  Surgeon: Margherita Shell, MD;  Location: [facility];  Service: Cardiovascular;  Laterality: N/A;   CATARACT EXTRACTION, BILATERAL  12/2015;01/2016   [facility]  ophthalmology   COLONOSCOPY     ENDARTERECTOMY Left 03/13/2021   Procedure: LEFT CAROTID ENDARTERECTOMY;  Surgeon: Margherita Shell, MD;  Location: [facility] OR;  Service: Vascular;  Laterality: Left;   ENDARTERECTOMY FEMORAL Bilateral 09/13/2019   Procedure: BILATERAL FEMORAL ENDARTERECTOMY with Patch Angioplasty;  Surgeon: Margherita Shell, MD;  Location: [facility] OR;  Service: Vascular;  Laterality: Bilateral;   INSERTION OF ILIAC STENT Left 09/13/2019   Procedure: INSERTION OF LEFT ILIAC STENT;  Surgeon: Margherita Shell, MD;  Location: [facility];  Service: Vascular;  Laterality: Left;   LEFT HEART CATH AND CORONARY  ANGIOGRAPHY N/A 12/30/2020   Procedure: LEFT HEART CATH AND CORONARY ANGIOGRAPHY;  Surgeon: Millicent Ally, MD;  Location: [facility];  Service: Cardiovascular;  Laterality: N/A;   PATCH ANGIOPLASTY Bilateral 09/13/2019   Procedure: PATCH ANGIOPLASTY Bilateral;  Surgeon: Margherita Shell, MD;  Location: [facility] OR;  Service: Vascular;  Laterality: Bilateral;   PERIPHERAL VASCULAR INTERVENTION Right 08/26/2020   Procedure: PERIPHERAL VASCULAR INTERVENTION;  Surgeon: Margherita Shell, MD;  Location: [facility];  Service: Cardiovascular;  Laterality: Right;  femoral popliteal artery   TUBAL LIGATION  1969     FAMILY HISTORY: Family History  Problem Relation Age of Onset   Stroke Mother    Arthritis Mother    CVA Mother    Diverticulitis Mother    Cancer Father    Hypertension Sister    Diverticulosis Sister    Breast cancer Cousin    Suicidality Brother    Hypertension Brother    Anxiety disorder Brother    Kidney Stones Brother    Ulcers Brother    Healthy Daughter     SOCIAL HISTORY: Social History   Socioeconomic History   Marital status: Divorced    Spouse name: Not on file   Number of children: 1   Years of education: 12   Highest education level: Not on file  Occupational History    Comment: retired Solicitor, Firefighter  Tobacco Use   Smoking status: Former    Current packs/day: 0.00    Average packs/day: 1 pack/day for 36.0 years (36.0 ttl pk-yrs)    Types: Cigarettes    Start date: 04/01/1964    Quit date: 04/01/2000    Years since quitting: 23.1   Smokeless tobacco: Never  Vaping Use   Vaping status: Never Used  Substance and Sexual Activity   Alcohol  use: Yes    Comment: 1 glass wine per week   Drug use: No   Sexual activity: Not on file  Other Topics Concern   Not on file  Social History Narrative   Lives alone   Caffeine - coffee, 1 cup daily; maybe 4 teas per week   Right handed   Social Drivers of Corporate investment banker Strain: Not on file  Food Insecurity: Not on file  Transportation Needs: Not on file  Physical Activity: Not on file  Stress: Not on file  Social Connections: Unknown (06/02/2021)   Received from [facility]   Social Network    Social Network: Not on file  Intimate Partner Violence: Unknown (04/23/2021)   Received from [employer], [facility]   HITS    Physically Hurt: Not on file    Insult or Talk Down To: Not on file    Threaten Physical Harm: Not on file    Scream or Curse: Not on file      PHYSICAL EXAM  Vitals:   05/24/23 1338  BP: 134/61  Pulse: (!) 55  Weight: 111 lb 6.4 oz (50.5 kg)   Height: 4\' 11"  (1.499 m)   Body mass index is 22.5 kg/m.  Generalized: Well developed, in no acute distress  Chest: Lungs clear to auscultation bilaterally  Neurological examination  Mentation: Alert oriented to time, place, history taking. Follows all commands speech and language fluent Cranial nerve II-XII: facial symmetry noted.    DIAGNOSTIC DATA (LABS, IMAGING, TESTING) - I reviewed patient records, labs, notes, testing and imaging myself where available.  Lab Results  Component Value Date   WBC  6.8 10/21/2021   HGB 12.4 10/21/2021   HCT 37.3 10/21/2021   MCV 94 10/21/2021   PLT 215 10/21/2021      Component Value Date/Time   NA 136 02/05/2022 1125   K 4.4 02/05/2022 1125   CL 102 02/05/2022 1125   CO2 23 02/05/2022 1125   GLUCOSE 117 (H) 02/05/2022 1125   GLUCOSE 110 (H) 03/14/2021 0357   BUN 15 02/05/2022 1125   CREATININE 1.34 (H) 02/05/2022 1125   CALCIUM  9.9 02/05/2022 1125   PROT 7.1 03/10/2021 0823   ALBUMIN 3.6 03/10/2021 0823   AST 25 03/10/2021 0823   ALT 16 03/10/2021 0823   ALKPHOS 60 03/10/2021 0823   BILITOT 0.6 03/10/2021 0823   GFRNONAA 36 (L) 03/14/2021 0357   GFRAA >60 09/14/2019 0229   Lab Results  Component Value Date   CHOL 150 01/28/2022   HDL 80 01/28/2022   LDLCALC 56 01/28/2022   TRIG 69 01/28/2022   CHOLHDL 1.9 01/28/2022   No results found for: "HGBA1C" No results found for: "VITAMINB12" No results found for: "TSH"    ASSESSMENT AND PLAN 78 y.o. year old female  has a past medical history of Arthritis, Complication of anesthesia, Coronary artery disease, Hyperlipidemia, Hypertension, Insomnia, and Sleep apnea. here with:  OSA on CPAP  - CPAP compliance excellent - Good treatment of AHI  - Encourage patient to use CPAP nightly and > 4 hours each night - F/U in 1 year or sooner if needed   Clem Currier, MSN, NP-C 05/24/2023, 1:35 PM [facility] [address], [address] [place], [place]  [address] [phone]

## 2023-05-24 NOTE — Patient Instructions (Signed)
Your Plan:  Continue using CPAP nightly and greater than 4 hours each night If your symptoms worsen or you develop new symptoms please let us know.       Thank you for coming to see us at [facility]. I hope we have been able to provide you high quality care today.  You may receive a patient satisfaction survey over the next few weeks. We would appreciate your feedback and comments so that we may continue to improve ourselves and the health of our patients.  

## 2023-05-28 ENCOUNTER — Other Ambulatory Visit (HOSPITAL_BASED_OUTPATIENT_CLINIC_OR_DEPARTMENT_OTHER): Payer: Self-pay | Admitting: Cardiology

## 2023-06-14 ENCOUNTER — Encounter: Payer: Self-pay | Admitting: Emergency Medicine

## 2023-06-14 ENCOUNTER — Ambulatory Visit: Attending: Emergency Medicine | Admitting: Emergency Medicine

## 2023-06-14 VITALS — BP 128/74 | HR 64 | Ht 59.0 in | Wt 110.8 lb

## 2023-06-14 DIAGNOSIS — E782 Mixed hyperlipidemia: Secondary | ICD-10-CM | POA: Diagnosis not present

## 2023-06-14 DIAGNOSIS — I739 Peripheral vascular disease, unspecified: Secondary | ICD-10-CM

## 2023-06-14 DIAGNOSIS — I5022 Chronic systolic (congestive) heart failure: Secondary | ICD-10-CM | POA: Diagnosis not present

## 2023-06-14 DIAGNOSIS — I2510 Atherosclerotic heart disease of native coronary artery without angina pectoris: Secondary | ICD-10-CM

## 2023-06-14 DIAGNOSIS — I340 Nonrheumatic mitral (valve) insufficiency: Secondary | ICD-10-CM | POA: Diagnosis not present

## 2023-06-14 DIAGNOSIS — I493 Ventricular premature depolarization: Secondary | ICD-10-CM

## 2023-06-14 DIAGNOSIS — I6523 Occlusion and stenosis of bilateral carotid arteries: Secondary | ICD-10-CM

## 2023-06-14 DIAGNOSIS — E870 Hyperosmolality and hypernatremia: Secondary | ICD-10-CM | POA: Diagnosis not present

## 2023-06-14 DIAGNOSIS — E875 Hyperkalemia: Secondary | ICD-10-CM | POA: Diagnosis not present

## 2023-06-14 DIAGNOSIS — Z79899 Other long term (current) drug therapy: Secondary | ICD-10-CM | POA: Diagnosis not present

## 2023-06-14 DIAGNOSIS — I10 Essential (primary) hypertension: Secondary | ICD-10-CM | POA: Diagnosis not present

## 2023-06-14 NOTE — Patient Instructions (Signed)
 Medication Instructions:  NO CHANGES   Lab Work: FASTING LIPID PANEL AND CMET TO BE DONE TODAY.   Testing/Procedures: NONE  Follow-Up: At [facility], you and your health needs are our priority.  As part of our continuing mission to provide you with exceptional heart care, our providers are all part of one team.  This team includes your primary Cardiologist (physician) and Advanced Practice Providers or APPs (Physician Assistants and Nurse Practitioners) who all work together to provide you with the care you need, when you need it.  Your next appointment:   6 MONTHS  Provider:   MADISON FOUNTAIN, DNP

## 2023-06-14 NOTE — Progress Notes (Signed)
 Cardiology Office Note:    Date:  06/14/2023  ID:  Helen Washington, DOB 20-Apr-1945, MRN [id number] PCP: Avva, Ravisankar, MD  [facility] Providers Cardiologist:  Wendie Hamburg, MD       Patient Profile:       Chief Complaint: 1 year follow-up History of Present Illness:  Helen Washington is a 78 y.o. female with visit-pertinent history of hypertension, hyperlipidemia, peripheral arterial disease  She was referred by Dr. Charlotte Cookey for preop evaluation prior to bilateral femoral endarterectomy. She was seen by Dr. Charlotte Cookey for evaluation of claudication. Angiogram on 05/15/2019 showed bilateral femoral artery occlusion, bilateral near occlusive popliteal stenosis.  Coronary CTA on 06/30/2019 showed coronary calcium  score 1503 (97th percentile).  CT FFR showed severe stenosis and small nondominant circumflex artery (was read as occluded mid LAD and mid RCA on FFR but reviewed with Dr Nicholette Barley and appears to not be occluded but rather not modeled on FFR due to small vessel size <1.54mm. Lexiscan  Myoview  on 08/24/2019 showed large perfusion defect in basal to apical inferior/inferoseptal, minimal reversibility, EF 42%. Echocardiogram on 08/29/2019 showed LVEF 45 to 50%, grade 1 diastolic dysfunction, RVSP 43 mmHg, moderate MR. Echocardiogram 11/04/2020 showed EF 45 to 50%, normal RV function, moderate to severe MR, RVSP 41 mmHg. Cardiac MRI on 12/16/2020 showed subendocardial LGE consistent with prior infarcts in basal to apical inferior, mid inferolateral, and apex with nonviable mid inferolateral, apical inferior, and apex; EF 41%, normal RV, mild to moderate MR (regurgitant fraction 18%). Cath on 12/30/2020 showed multivessel CAD (occluded mid RCA, 50% mid LAD, 70% OM, 20% proximal LCx), EF 45%, LVEDP 16 mmHg. Medical therapy recommended.  Echocardiogram 07/27/2022 showed LVEF 50 to 55%, grade 1 DD, RV function and size normal, mild MR.  Last seen in clinic on 01/28/2022.  Patient was doing well at the  time with no acute cardiovascular concerns or complaints.  Medication management was continued.  Patient was to follow-up in 1 year.   Discussed the use of AI scribe software for clinical note transcription with the patient, who gave verbal consent to proceed.  History of Present Illness Helen Washington is a 78 year old female with coronary artery disease and heart failure who presents for a follow-up visit.  Today patient is doing well overall.  She is without any acute cardiovascular concerns at this time.  She denies any exertional symptoms.  Denies any angina.  No chest pain or dyspnea.    She is fairly active, going to water aerobics at least 3 times a week for an hour at a time.  She also walks her dog.  Doing these activities she denies any anginal symptoms.    She admits to not having the healthiest diet.  She eats an oatmeal cookie and coffee every morning for breakfast, half a sandwich and chips every day for lunch, and frozen meals every day for dinner.    She is without any dizziness, lightheadedness, syncope.  She denies any claudication.  Review of systems:  Please see the history of present illness. All other systems are reviewed and otherwise negative.      Studies Reviewed:    EKG Interpretation Date/Time:  Tuesday Jun 14 2023 11:29:52 EDT Ventricular Rate:  64 PR Interval:  134 QRS Duration:  74 QT Interval:  422 QTC Calculation: 435 R Axis:   46  Text Interpretation: Sinus rhythm with occasional Premature ventricular complexes Nonspecific T wave abnormality When compared with ECG of 30-Dec-2020 06:12, Premature ventricular complexes are  now Present Confirmed by Palmer Bobo [phone]) on 06/14/2023 11:58:47 AM    Echocardiogram 07/27/2022  1. Hypokinesis of the inferobasal wall with overall preserved LV  function.   2. Left ventricular ejection fraction, by estimation, is 50 to 55%. The  left ventricle has low normal function. The left ventricle demonstrates   regional wall motion abnormalities (see scoring diagram/findings for  description). There is mild left  ventricular hypertrophy of the basal segment. Left ventricular diastolic  parameters are consistent with Grade I diastolic dysfunction (impaired  relaxation). The average left ventricular global longitudinal strain is  15.9 %. The global longitudinal strain   is abnormal.   3. Right ventricular systolic function is normal. The right ventricular  size is normal.   4. The mitral valve is normal in structure. Mild mitral valve  regurgitation. No evidence of mitral stenosis.   5. Tricuspid valve regurgitation is moderate.   6. The aortic valve is tricuspid. Aortic valve regurgitation is not  visualized. Aortic valve sclerosis is present, with no evidence of aortic  valve stenosis.   7. The inferior vena cava is normal in size with greater than 50%  respiratory variability, suggesting right atrial pressure of 3 mmHg.   Cardiac catheterization 12/30/2020   Prox RCA lesion is 99% stenosed.   Prox RCA to Mid RCA lesion is 100% stenosed.   Ost Cx to Prox Cx lesion is 10% stenosed.   Mid LAD lesion is 50% stenosed.   Mid RCA to Dist RCA lesion is 99% stenosed.   Prox Cx lesion is 20% stenosed.   2nd Mrg lesion is 70% stenosed.   LV end diastolic pressure is mildly elevated.   There is evidence for severe diffuse multivessel coronary calcification involving all 3 major coronary arteries.  The proximal LAD has focal  50% calcification just proximal to diagonal bifurcation; the circumflex vessel has 20 and 70% eccentric calcification and the RCA is severely calcified with segmental subtotal stenoses with mid bridging collaterals and extensive left-to-right collateralization from the LAD and septal perforating arteries supplying the PDA.   Mild to moderate LV dysfunction with EF estimated approximately 45%.  There is significant mid to apical inferior hypocontractility with mild mid anterolateral  hypocontractility.  LVEDP 16 mmHg.  Diagnostic Dominance: Right  Risk Assessment/Calculations:              Physical Exam:   VS:  BP 128/74   Pulse 64   Ht 4\' 11"  (1.499 m)   Wt 110 lb 12.8 oz (50.3 kg)   SpO2 95%   BMI 22.38 kg/m    Wt Readings from Last 3 Encounters:  06/14/23 110 lb 12.8 oz (50.3 kg)  05/24/23 111 lb 6.4 oz (50.5 kg)  04/11/23 111 lb 9.6 oz (50.6 kg)    GEN: Well nourished, well developed in no acute distress NECK: No JVD; No carotid bruits CARDIAC: RRR, no murmurs, rubs, gallops RESPIRATORY:  Clear to auscultation without rales, wheezing or rhonchi  ABDOMEN: Soft, non-tender, non-distended EXTREMITIES:  No edema; No acute deformity      Assessment and Plan:  Coronary artery disease Coronary CTA  06/2019 showed calcium  score 1503 (97 percentile)  cMRI 11/2020 showed subendocardial LGE consistent with prior infarcts in basal to apical inferior, mid inferolateral, and apex with nonviable mid inferolateral, apical inferior, and apex  Cardiac cath 12/2020 showed multivessel CAD (occluded mid RCA, 50% mid LAD, 70% OM, 20% proximal LCx), EF 45%, LVEDP 16 mmHg  - EKG today was without  any acute ischemic changes - Today patient is without any anginal symptoms, no indication for further ischemic evaluation at this time - Continue aspirin  81 mg daily, clopidogrel  75 mg daily, ezetimibe  10 mg daily, rosuvastatin  40 mg daily  Chronic systolic heart failure Echo 08/2019 40 to 45%. Stable at 45 to 50% on echo 11/04/2020. EF 41% on cMRI 12/16/2020.  Most recent echo 07/2022 showed improved LVEF of 50 to 55% - Today patient is euvolemic and well compensated on exam.  NYHA class II.  Not requiring diuretic therapy. - Denies any CP, dyspnea, orthopnea, PND, leg swelling - Given recent improvement in LVEF we will hold off on further GDMT titration at this time - Continue carvedilol  3.125 mg twice daily, Jardiance  10 mg daily, losartan  25 mg daily  Mitral  regurgitation cMRI shows mild to moderate MR (regurgitant fraction 18%)  Echocardiogram 07/2022 shows mild MR - Today she remains asymptomatic - Will repeat echocardiogram as clinically indicated for routine surveillance  Peripheral artery disease Angiogram on 05/15/2019 showed bilateral femoral artery occlusion, bilateral near occlusive popliteal stenosis. Status post bilateral femoral endarterectomy on 09/13/2019. Underwent stent to right superficial femoral and popliteal artery on 08/26/2020.  - Today she denies any claudication  - Continue aspirin  81 mg daily and Plavix  75 mg daily - Managed by vascular surgery  Hypertension Blood pressure today well-controlled at 128/74 - Maintain home BP log - Continue carvedilol  3.125 mg twice daily and losartan  25 mg daily  Hyperlipidemia LDL 56, HDL 80, TG 69 on 01/2022 LDL very slightly above goal of less than 55 - Repeat fasting lipid panel and CMET today - Encouraged heart healthy dieting and physical exercise - Continue rosuvastatin  40 mg daily and ezetimibe  10 mg daily  Carotid stenosis S/p left carotid endarterectomy on 03/13/2021 - Continue aspirin  81 mg daily and Plavix  75 mg daily - Managed by vascular surgery  PVC EKG today shows NSR with occasional PVC - Today patient remains asymptomatic and denies any irregular heartbeats or palpitations.  No evidence of overt heart failure exacerbation - Will continue to clinically monitor and consider ZIO in the future if patient becomes symptomatic to assess PVC burden      Dispo:  Return in about 6 months (around 12/15/2023).  Signed, Ava Boatman, NP

## 2023-06-15 LAB — COMPREHENSIVE METABOLIC PANEL WITH GFR
ALT: 30 IU/L (ref 0–32)
AST: 33 IU/L (ref 0–40)
Albumin: 4 g/dL (ref 3.8–4.8)
Alkaline Phosphatase: 72 IU/L (ref 44–121)
BUN/Creatinine Ratio: 12 (ref 12–28)
BUN: 14 mg/dL (ref 8–27)
Bilirubin Total: 0.4 mg/dL (ref 0.0–1.2)
CO2: 20 mmol/L (ref 20–29)
Calcium: 9.9 mg/dL (ref 8.7–10.3)
Chloride: 105 mmol/L (ref 96–106)
Creatinine, Ser: 1.14 mg/dL — ABNORMAL HIGH (ref 0.57–1.00)
Globulin, Total: 2.9 g/dL (ref 1.5–4.5)
Glucose: 85 mg/dL (ref 70–99)
Potassium: 5.3 mmol/L — ABNORMAL HIGH (ref 3.5–5.2)
Sodium: 141 mmol/L (ref 134–144)
Total Protein: 6.9 g/dL (ref 6.0–8.5)
eGFR: 49 mL/min/{1.73_m2} — ABNORMAL LOW (ref >59)

## 2023-06-15 LAB — LIPID PANEL
Chol/HDL Ratio: 1.9 ratio (ref 0.0–4.4)
Cholesterol, Total: 144 mg/dL (ref 100–199)
HDL: 76 mg/dL (ref >39)
LDL Chol Calc (NIH): 55 mg/dL (ref 0–99)
Triglycerides: 66 mg/dL (ref 0–149)
VLDL Cholesterol Cal: 13 mg/dL (ref 5–40)

## 2023-06-16 ENCOUNTER — Ambulatory Visit: Payer: Self-pay | Admitting: Emergency Medicine

## 2023-06-16 DIAGNOSIS — H52203 Unspecified astigmatism, bilateral: Secondary | ICD-10-CM | POA: Diagnosis not present

## 2023-06-16 DIAGNOSIS — I10 Essential (primary) hypertension: Secondary | ICD-10-CM

## 2023-06-16 DIAGNOSIS — Z79899 Other long term (current) drug therapy: Secondary | ICD-10-CM

## 2023-06-16 DIAGNOSIS — H31003 Unspecified chorioretinal scars, bilateral: Secondary | ICD-10-CM | POA: Diagnosis not present

## 2023-06-16 DIAGNOSIS — H5203 Hypermetropia, bilateral: Secondary | ICD-10-CM | POA: Diagnosis not present

## 2023-06-16 DIAGNOSIS — H4322 Crystalline deposits in vitreous body, left eye: Secondary | ICD-10-CM | POA: Diagnosis not present

## 2023-06-16 DIAGNOSIS — E870 Hyperosmolality and hypernatremia: Secondary | ICD-10-CM

## 2023-06-22 ENCOUNTER — Other Ambulatory Visit: Payer: Self-pay | Admitting: Cardiology

## 2023-06-24 ENCOUNTER — Other Ambulatory Visit (HOSPITAL_BASED_OUTPATIENT_CLINIC_OR_DEPARTMENT_OTHER): Payer: Self-pay | Admitting: Cardiology

## 2023-06-24 DIAGNOSIS — G4733 Obstructive sleep apnea (adult) (pediatric): Secondary | ICD-10-CM | POA: Diagnosis not present

## 2023-08-10 ENCOUNTER — Encounter: Payer: Self-pay | Admitting: Surgery

## 2023-08-23 ENCOUNTER — Ambulatory Visit: Admitting: Cardiology

## 2023-08-23 ENCOUNTER — Other Ambulatory Visit: Payer: Self-pay

## 2023-08-23 DIAGNOSIS — I70213 Atherosclerosis of native arteries of extremities with intermittent claudication, bilateral legs: Secondary | ICD-10-CM

## 2023-08-23 DIAGNOSIS — I6523 Occlusion and stenosis of bilateral carotid arteries: Secondary | ICD-10-CM

## 2023-08-29 DIAGNOSIS — M810 Age-related osteoporosis without current pathological fracture: Secondary | ICD-10-CM | POA: Diagnosis not present

## 2023-08-29 DIAGNOSIS — E7849 Other hyperlipidemia: Secondary | ICD-10-CM | POA: Diagnosis not present

## 2023-08-29 DIAGNOSIS — N1831 Chronic kidney disease, stage 3a: Secondary | ICD-10-CM | POA: Diagnosis not present

## 2023-08-29 DIAGNOSIS — E785 Hyperlipidemia, unspecified: Secondary | ICD-10-CM | POA: Diagnosis not present

## 2023-08-29 DIAGNOSIS — I5022 Chronic systolic (congestive) heart failure: Secondary | ICD-10-CM | POA: Diagnosis not present

## 2023-08-29 DIAGNOSIS — I130 Hypertensive heart and chronic kidney disease with heart failure and stage 1 through stage 4 chronic kidney disease, or unspecified chronic kidney disease: Secondary | ICD-10-CM | POA: Diagnosis not present

## 2023-08-29 DIAGNOSIS — Z1212 Encounter for screening for malignant neoplasm of rectum: Secondary | ICD-10-CM | POA: Diagnosis not present

## 2023-09-05 DIAGNOSIS — Z23 Encounter for immunization: Secondary | ICD-10-CM | POA: Diagnosis not present

## 2023-09-05 DIAGNOSIS — Z1339 Encounter for screening examination for other mental health and behavioral disorders: Secondary | ICD-10-CM | POA: Diagnosis not present

## 2023-09-05 DIAGNOSIS — Z1331 Encounter for screening for depression: Secondary | ICD-10-CM | POA: Diagnosis not present

## 2023-09-05 DIAGNOSIS — I10 Essential (primary) hypertension: Secondary | ICD-10-CM | POA: Diagnosis not present

## 2023-09-05 DIAGNOSIS — R82998 Other abnormal findings in urine: Secondary | ICD-10-CM | POA: Diagnosis not present

## 2023-09-09 ENCOUNTER — Other Ambulatory Visit: Payer: Self-pay | Admitting: Cardiology

## 2023-09-22 DIAGNOSIS — M810 Age-related osteoporosis without current pathological fracture: Secondary | ICD-10-CM | POA: Diagnosis not present

## 2023-09-26 ENCOUNTER — Ambulatory Visit (HOSPITAL_BASED_OUTPATIENT_CLINIC_OR_DEPARTMENT_OTHER)
Admission: RE | Admit: 2023-09-26 | Discharge: 2023-09-26 | Disposition: A | Discharge status: Home / Self Care | Source: Ambulatory Visit | Attending: Surgery | Admitting: Surgery

## 2023-09-26 ENCOUNTER — Ambulatory Visit (HOSPITAL_COMMUNITY)
Admission: RE | Admit: 2023-09-26 | Discharge: 2023-09-26 | Disposition: A | Discharge status: Home / Self Care | Source: Ambulatory Visit | Attending: Surgery | Admitting: Surgery

## 2023-09-26 ENCOUNTER — Ambulatory Visit

## 2023-09-26 DIAGNOSIS — I70213 Atherosclerosis of native arteries of extremities with intermittent claudication, bilateral legs: Secondary | ICD-10-CM | POA: Diagnosis not present

## 2023-09-26 DIAGNOSIS — I6523 Occlusion and stenosis of bilateral carotid arteries: Secondary | ICD-10-CM | POA: Diagnosis not present

## 2023-09-26 LAB — VAS US ABI WITH/WO TBI
Left ABI: 0.64
Right ABI: 0.94

## 2023-10-03 ENCOUNTER — Ambulatory Visit: Attending: Surgery | Admitting: Physician Assistant

## 2023-10-03 VITALS — BP 131/65 | HR 69 | Temp 97.7°F | Wt 110.7 lb

## 2023-10-03 DIAGNOSIS — I6523 Occlusion and stenosis of bilateral carotid arteries: Secondary | ICD-10-CM

## 2023-10-03 DIAGNOSIS — I70213 Atherosclerosis of native arteries of extremities with intermittent claudication, bilateral legs: Secondary | ICD-10-CM

## 2023-10-04 ENCOUNTER — Other Ambulatory Visit: Payer: Self-pay | Admitting: *Deleted

## 2023-10-04 DIAGNOSIS — I739 Peripheral vascular disease, unspecified: Secondary | ICD-10-CM

## 2023-10-04 DIAGNOSIS — I70213 Atherosclerosis of native arteries of extremities with intermittent claudication, bilateral legs: Secondary | ICD-10-CM

## 2023-10-05 NOTE — Progress Notes (Signed)
 Office Note   History of Present Illness   Karri Kallenbach is a 78 y.o. ([birth year]) female who presents for surveillance of PAD.  She has a history of bilateral common femoral endarterectomy with left external iliac artery stenting on 09/13/2019 by Dr. Serene.  This was done for severe bilateral lower extremity claudication with bilateral femoral artery occlusions and severe left iliac stenosis.  Her symptoms on the right were not resolved after endarterectomy, so she underwent right SFA/pop stenting on 08/26/2020 by Dr. Serene.  Her claudication symptoms significantly improved after this.  She has also required left carotid endarterectomy on 03/13/2021 by Dr. Serene for asymptomatic critical stenosis.   She returns today for follow-up.  She has no complaints at today's visit.  Her lower extremity symptoms remain the same, with occasional claudication in the left calf after walking long distances.  She denies any right lower extremity claudication.  She denies any rest pain or tissue loss.  She also denies any strokelike symptoms such as slurred speech, facial droop, sudden weakness/numbness, or sudden visual changes.   Current Outpatient Medications  Medication Sig Dispense Refill   alendronate  (FOSAMAX ) 70 MG tablet Take 70 mg by mouth every Monday. Take with a full glass of water on an empty stomach. (Patient not taking: Reported on 04/11/2023)     aspirin  EC 81 MG tablet Take 1 tablet (81 mg total) by mouth daily. 90 tablet 3   carvedilol  (COREG ) 3.125 MG tablet TAKE 1 TABLET BY MOUTH TWICE A DAY 180 tablet 1   Cholecalciferol (VITAMIN D3) 50 MCG (2000 UT) CAPS Take 2,000 Units by mouth daily.     clopidogrel  (PLAVIX ) 75 MG tablet TAKE 1 TABLET BY MOUTH EVERY DAY 90 tablet 3   empagliflozin  (JARDIANCE ) 10 MG TABS tablet TAKE 1 TABLET BY MOUTH EVERY DAY BEFORE BREAKFAST 15 tablet 0   ezetimibe  (ZETIA ) 10 MG tablet TAKE 1 TABLET BY MOUTH EVERY DAY 90 tablet 2   losartan  (COZAAR ) 25 MG tablet 1  tablet Orally Once a day for 90 days     rosuvastatin  (CRESTOR ) 40 MG tablet TAKE 1 TABLET BY MOUTH EVERY DAY 90 tablet 3   No current facility-administered medications for this visit.    REVIEW OF SYSTEMS (negative unless checked):   Cardiac:  []  Chest pain or chest pressure? []  Shortness of breath upon activity? []  Shortness of breath when lying flat? []  Irregular heart rhythm?  Vascular:  [x]  Pain in calf, thigh, or hip brought on by walking? []  Pain in feet at night that wakes you up from your sleep? []  Blood clot in your veins? []  Leg swelling?  Pulmonary:  []  Oxygen at home? []  Productive cough? []  Wheezing?  Neurologic:  []  Sudden weakness in arms or legs? []  Sudden numbness in arms or legs? []  Sudden onset of difficult speaking or slurred speech? []  Temporary loss of vision in one eye? []  Problems with dizziness?  Gastrointestinal:  []  Blood in stool? []  Vomited blood?  Genitourinary:  []  Burning when urinating? []  Blood in urine?  Psychiatric:  []  Major depression  Hematologic:  []  Bleeding problems? []  Problems with blood clotting?  Dermatologic:  []  Rashes or ulcers?  Constitutional:  []  Fever or chills?  Ear/Nose/Throat:  []  Change in hearing? []  Nose bleeds? []  Sore throat?  Musculoskeletal:  []  Back pain? []  Joint pain? []  Muscle pain?   Physical Examination   Vitals:   10/03/23 0853 10/03/23 0856  BP: 132/64 131/65  Pulse: 69  Temp: 97.7 F (36.5 C)   TempSrc: Temporal   Weight: 110 lb 11.2 oz (50.2 kg)    Body mass index is 22.36 kg/m.  General:  WDWN in NAD; vital signs documented above Gait: Not observed HENT: WNL, normocephalic Pulmonary: normal non-labored breathing  Cardiac: regular Abdomen: soft, NT, no masses Skin: without rashes Vascular Exam/Pulses: Brisk left DP/PT Doppler signals.  Palpable right DP pulse Extremities: without ischemic changes, without gangrene , without cellulitis; without open wounds;   Musculoskeletal: no muscle wasting or atrophy  Neurologic: A&O X 3;  No focal weakness or paresthesias are detected Psychiatric:  The pt has Normal affect.  Non-Invasive Vascular imaging   ABI (09/26/2023) R:  ABI: 0.94 (1.16),  PT: tri DP: bi TBI:  0.62 L:  ABI: 0.64 (0.76),  PT: mono DP: mono TBI: 0.42  Aortoiliac Duplex (09/26/2023) Patent left EIA stent without stenosis  RLE Arterial Duplex (09/26/2023) Patent right SFA stent without stenosis.  Patent right popliteal stent with 50% mid stent stenosis with a PSV of 228 cm/s   Carotid Duplex (09/26/2023) Stable 1 to 39% right ICA stenosis.  Patent left carotid endarterectomy site without restenosis   Medical Decision Making   Kisa Fujii is a 78 y.o. female who presents for surveillance of PAD and carotid stenosis  Based on the patient's vascular studies, her ABIs have slightly decreased bilaterally since her last office visit.  Her right ABI is 0.94 and left ABI is 0.64. Her drop in ABIs is likely due to tibial disease Arterial duplex demonstrates a patent left EIA stent and right SFA stent without stenosis.  Her right popliteal stent is also patent with unchanged 50% mid stent stenosis.  Dr. Serene has previously noted that the patient would likely require intervention on her stent if velocities go above 300 cm/s Carotid duplex demonstrates stable 1 to 39% right ICA stenosis.  She has a patent left carotid endarterectomy site without restenosis On exam she is neurologically intact.  She has brisk left DP/PT Doppler signals and a palpable right DP pulse.  She denies any rest pain or tissue loss.  She denies any strokelike symptoms She can follow-up with our office in 6 months with repeat ABIs, left aortoiliac duplex, and RLE arterial duplex. Her carotids can be restudied in 1 year   Ahmed Holster PA-C [facility] Office: [phone]  Clinic MD: Serene

## 2023-10-10 ENCOUNTER — Other Ambulatory Visit: Payer: Self-pay | Admitting: Cardiology

## 2023-10-21 DIAGNOSIS — M545 Low back pain, unspecified: Secondary | ICD-10-CM | POA: Diagnosis not present

## 2023-11-10 DIAGNOSIS — M25512 Pain in left shoulder: Secondary | ICD-10-CM | POA: Diagnosis not present

## 2023-11-10 DIAGNOSIS — M545 Low back pain, unspecified: Secondary | ICD-10-CM | POA: Diagnosis not present

## 2023-11-10 DIAGNOSIS — I700 Atherosclerosis of aorta: Secondary | ICD-10-CM | POA: Diagnosis not present

## 2023-11-17 DIAGNOSIS — M7502 Adhesive capsulitis of left shoulder: Secondary | ICD-10-CM | POA: Diagnosis not present

## 2023-11-24 DIAGNOSIS — M545 Low back pain, unspecified: Secondary | ICD-10-CM | POA: Diagnosis not present

## 2023-12-09 DIAGNOSIS — M7502 Adhesive capsulitis of left shoulder: Secondary | ICD-10-CM | POA: Diagnosis not present

## 2023-12-12 DIAGNOSIS — M48062 Spinal stenosis, lumbar region with neurogenic claudication: Secondary | ICD-10-CM | POA: Diagnosis not present

## 2023-12-14 DIAGNOSIS — M4150 Other secondary scoliosis, site unspecified: Secondary | ICD-10-CM | POA: Diagnosis not present

## 2023-12-14 DIAGNOSIS — M48062 Spinal stenosis, lumbar region with neurogenic claudication: Secondary | ICD-10-CM | POA: Diagnosis not present

## 2023-12-14 DIAGNOSIS — M4036 Flatback syndrome, lumbar region: Secondary | ICD-10-CM | POA: Diagnosis not present

## 2023-12-19 DIAGNOSIS — M419 Scoliosis, unspecified: Secondary | ICD-10-CM | POA: Diagnosis not present

## 2023-12-19 DIAGNOSIS — M48062 Spinal stenosis, lumbar region with neurogenic claudication: Secondary | ICD-10-CM | POA: Diagnosis not present

## 2023-12-22 DIAGNOSIS — M7502 Adhesive capsulitis of left shoulder: Secondary | ICD-10-CM | POA: Diagnosis not present

## 2023-12-26 DIAGNOSIS — G4733 Obstructive sleep apnea (adult) (pediatric): Secondary | ICD-10-CM | POA: Diagnosis not present

## 2023-12-28 DIAGNOSIS — M7502 Adhesive capsulitis of left shoulder: Secondary | ICD-10-CM | POA: Diagnosis not present

## 2023-12-30 DIAGNOSIS — M48062 Spinal stenosis, lumbar region with neurogenic claudication: Secondary | ICD-10-CM | POA: Diagnosis not present

## 2023-12-30 DIAGNOSIS — M4126 Other idiopathic scoliosis, lumbar region: Secondary | ICD-10-CM | POA: Diagnosis not present

## 2023-12-30 DIAGNOSIS — M47816 Spondylosis without myelopathy or radiculopathy, lumbar region: Secondary | ICD-10-CM | POA: Diagnosis not present

## 2024-01-03 ENCOUNTER — Telehealth (HOSPITAL_BASED_OUTPATIENT_CLINIC_OR_DEPARTMENT_OTHER): Payer: Self-pay

## 2024-01-03 DIAGNOSIS — M7502 Adhesive capsulitis of left shoulder: Secondary | ICD-10-CM | POA: Diagnosis not present

## 2024-01-03 NOTE — Telephone Encounter (Signed)
 Pt has been scheduled @ [facility] location for preop clearance. Pt agreeable to plan of care. Appt Rosaline Bane, NP 01/04/24 @ 3:35.

## 2024-01-03 NOTE — Telephone Encounter (Signed)
° °  Name: Helen Washington  DOB: 1945/12/09  MRN: [id number]  Primary Cardiologist: Lonni LITTIE Nanas, MD  Chart reviewed as part of pre-operative protocol coverage. Because of Helen Washington's past medical history and time since last visit, she will require a follow-up in-office visit in order to better assess preoperative cardiovascular risk.  Pre-op covering staff: - Please schedule appointment and call patient to inform them. If patient already had an upcoming appointment within acceptable timeframe, please add pre-op clearance to the appointment notes so provider is aware. - Please contact requesting surgeon's office via preferred method (i.e, phone, fax) to inform them of need for appointment prior to surgery.  As long as no new cardiac symptoms at the time of office visit can hold aspirin  x 7 days and Plavix  x 5 days prior to procedure.  Please resume both medications when medically safe to do so.  Helen LOISE Fabry, PA-C  01/03/2024, 12:22 PM

## 2024-01-03 NOTE — Progress Notes (Unsigned)
 Cardiology Office Note   Date:  01/04/2024  ID:  Helen Washington, DOB 29-Jun-1945, MRN [id number] PCP: Avva, Ravisankar, MD  [facility] Providers Cardiologist:  Lonni LITTIE Nanas, MD     [facility] Hypertension Coronary artery disease Chronic HFrEF Ischemic cardiomyopathy Hyperlipidemia Peripheral arterial disease Bilateral common femoral endarterectomy with left external iliac artery stenting 09/13/2019 Right SFA/popliteal stenting 08/26/2020 Carotid stenosis S/p left carotid endarterectomy 03/13/2021 Mitral valve regurgitation  Extensive PAD with angiogram 05/15/2019 which showed bilateral femoral artery occlusion, bilateral near occlusive popliteal stenosis.  Coronary CTA 06/30/2019 showed CAC score of 1503 (97th percentile), severe stenosis and small nondominant circumflex artery (was read as occluded mid LAD and mid RCA on FFR but reviewed by Dr. Maranda and appeared to not be occluded but rather modeled on FFR due to small vessel < 1.8 mm).  Lexiscan  Myoview  08/24/2019 showed large perfusion defect in basal to apical inferior/infero septal, minimal reversibility, EF 42%.  Echo 08/29/2019 showed LVEF 45 to 50%, grade 1 diastolic dysfunction, RVSP 43 mmHg, moderate TR.  Cardiac MRI 12/16/2020 showed subendocardial LGE consistent with prior infarcts in basal to apical inferior, mid inferolateral , and apex with nonviable mid inferolateral, apical inferior and apex; EF 41%, normal RV, mild to moderate MR (regurgitant fraction 18%).  Cardiac cath 12/30/2020 showed multivessel CAD with occluded mid RCA, 50% mid LAD, 70% OM, 20% proximal LCx), EF 45%, LVEDP 16 mmHg.  Medical therapy recommended.  Echo 07/27/2022 showed LVEF 50 to 55%, grade 1 DD, RV function and size normal, mild MR.  Echo 07/27/2022 with mildly abnormal LVEF 50 to 55%, regional wall motion abnormalities, G1 DD, normal RV, mild MR, no significant changes per Dr. Nanas.   Last clinic visit was 06/14/2023 with Lum Louis, NP at  which time she was fairly active going to water aerobics at least 3 times a week for an hour and walking her dog.  She was not having any exertional symptoms concerning for angina.  She admits to not having the healthiest diet, often eating oatmeal cookies and coffee for breakfast, half a sandwich and chips for lunch, and frozen meals for dinner.  No claudication.  She was euvolemic on exam felt to be, NYHA class II.  Not requiring diuretic therapy.   History of Present Illness  Discussed the use of AI scribe software for clinical note transcription with the patient, who gave verbal consent to proceed.  History of Present Illness Helen Washington is a very pleasant 78 year old female who presents today for cardiac evaluation for upcoming lumbar fusion.  EKG today reveals frequent PVCs.  She is unaware of these.  She denies palpitations, chest pain, shortness of breath, orthopnea, PND, edema, presyncope, syncope.  She sometimes has to stop her household activities due to back pain. In 2022, she underwent cardiac catheterization that showed coronary blockages and narrowing without stent placement. She reports dizziness when rising from a lying position during shoulder therapy but no syncope. Recent lab testing revealed elevated creatinine. She admits she does not hydrate well.  She does not monitor blood sugar but states it was normal on her last test.  ROS: See HPI  Studies Reviewed EKG Interpretation Date/Time:  Wednesday January 04 2024 16:03:22 EST Ventricular Rate:  78 PR Interval:  130 QRS Duration:  82 QT Interval:  378 QTC Calculation: 430 R Axis:   83  Text Interpretation: Sinus rhythm with frequent Premature ventricular complexes ST & T wave abnormality, consider inferior ischemia When compared with ECG of  14-Jun-2023 11:29, No significant change was found Confirmed by Percy Browning [phone]) on 01/04/2024 4:07:00 PM     No results found for: LIPOA  Risk Assessment/Calculations            Physical Exam VS:  BP 132/66 (BP Location: Left Arm, Patient Position: Sitting, Cuff Size: Normal)   Pulse 78   Ht 4' 11 (1.499 m)   Wt 112 lb (50.8 kg)   SpO2 96%   BMI 22.62 kg/m    Wt Readings from Last 3 Encounters:  01/04/24 112 lb (50.8 kg)  10/03/23 110 lb 11.2 oz (50.2 kg)  06/14/23 110 lb 12.8 oz (50.3 kg)    GEN: Well nourished, well developed in no acute distress NECK: No JVD; No carotid bruits CARDIAC: Irregular RR, no murmurs, rubs, gallops RESPIRATORY:  Clear to auscultation without rales, wheezing or rhonchi  ABDOMEN: Soft, non-tender, non-distended EXTREMITIES:  No edema; No deformity    Assessment & Plan Preoperative cardiac evaluation She is scheduled for lumbar fusion with Dr. Mavis due to chronic back pain. According to the Revised Cardiac Risk Index (RCRI), her Perioperative Risk of Major Cardiac Event is (%): 11 based on history of ischemic heart disease, ischemic cardiomyopathy, and high risk surgery, however from a cardiac perspective she is doing well. Her Functional Capacity in METs is: 4.4 according to the Duke Activity Status Index (DASI). Therefore, based on ACC/AHA guidelines, the patient would be at acceptable risk for the planned procedure without further cardiovascular testing.  - Antiplatelet therapy as prescribed by vascular surgery, would recommend recommendations for holding be obtained from Dr. Serene - Will forward to requesting provider  Premature ventricular contractions   Frequent PVCs appear on EKG. She is asymptomatic. Echo 07/27/22 revealed normal heart function.  She does not have any symptoms concerning for worsening heart function.  She remains active with limitations imposed by back pain but no concerning cardiac symptoms with exertion.  - Increase carvedilol  to 6.25 mg twice daily -Notify us  if you develop dizziness or hypotension  Coronary artery disease   Hyperlipidemia LDL goal < 55 Previous catheterization in 2022  failed multivessel CAD with occluded mid RCA, 50% mid LAD, 70% OM, 20% proximal LCx), EF 45%, LVEDP 16 mmHg. Medical therapy recommended. She denies chest pain, dyspnea, or other symptoms concerning for angina.  No indication for further ischemic evaluation at this time.  Lipids are well-controlled with LDL of 55 on labs completed 06/14/2023. - Continue carvedilol , aspirin , Plavix , ezetimibe , losartan , rosuvastatin   Mitral insufficiency TTE 07/27/2022 revealed mild MR.  She is asymptomatic. - Continue to monitor clinically for now  PAD Carotid artery disease S/p left carotid endarterectomy 03/13/2021.  No symptoms of claudication and no symptoms concerning for TIA or dizziness. -Continue aspirin , Plavix  -Management per vascular surgery  Essential hypertension   Blood pressure is well-controlled.  She reports SBP typically 130 or higher.  Her increasing carvedilol  as noted above for frequent PVCs. - Monitor blood pressure after increasing carvedilol  dosage - Report any dizziness or hypotension.        Dispo: 6 months with Dr. Kate  Signed, Browning Percy, NP-C

## 2024-01-03 NOTE — Telephone Encounter (Signed)
° °  Pre-operative Risk Assessment    Patient Name: Helen Washington  DOB: 1945-09-27 MRN: [id number]   Date of last office visit: 06/14/23 with Rana   Date of next office visit: NA  Request for Surgical Clearance    Procedure:  Lumbar fusion  Date of Surgery:  Clearance TBD                                 Surgeon: Dr. Mavis Socks Group or Practice Name:  [facility] Phone number:  [phone] Fax number:  [phone]   Type of Clearance Requested:   - Medical  - Pharmacy:  Hold Aspirin  and Clopidogrel  (Plavix ) not indicated   Type of Anesthesia:  General    Additional requests/questions:    Bonney Augustin JONETTA Delores   01/03/2024, 8:58 AM

## 2024-01-04 ENCOUNTER — Encounter (HOSPITAL_BASED_OUTPATIENT_CLINIC_OR_DEPARTMENT_OTHER): Payer: Self-pay | Admitting: Nurse Practitioner

## 2024-01-04 ENCOUNTER — Ambulatory Visit (HOSPITAL_BASED_OUTPATIENT_CLINIC_OR_DEPARTMENT_OTHER): Admitting: Nurse Practitioner

## 2024-01-04 VITALS — BP 132/66 | HR 78 | Ht 59.0 in | Wt 112.0 lb

## 2024-01-04 DIAGNOSIS — I340 Nonrheumatic mitral (valve) insufficiency: Secondary | ICD-10-CM

## 2024-01-04 DIAGNOSIS — I10 Essential (primary) hypertension: Secondary | ICD-10-CM | POA: Diagnosis not present

## 2024-01-04 DIAGNOSIS — I2510 Atherosclerotic heart disease of native coronary artery without angina pectoris: Secondary | ICD-10-CM | POA: Diagnosis not present

## 2024-01-04 DIAGNOSIS — E782 Mixed hyperlipidemia: Secondary | ICD-10-CM

## 2024-01-04 DIAGNOSIS — I739 Peripheral vascular disease, unspecified: Secondary | ICD-10-CM | POA: Diagnosis not present

## 2024-01-04 DIAGNOSIS — Z01810 Encounter for preprocedural cardiovascular examination: Secondary | ICD-10-CM

## 2024-01-04 DIAGNOSIS — I493 Ventricular premature depolarization: Secondary | ICD-10-CM | POA: Diagnosis not present

## 2024-01-04 MED ORDER — CARVEDILOL 6.25 MG PO TABS: 6.2500 mg | ORAL_TABLET | Freq: Two times a day (BID) | ORAL | 3 refills | Status: AC

## 2024-01-04 NOTE — Patient Instructions (Addendum)
 Medication Instructions:  Increase carvedilol  (Coreg ) to 6.25 mg twice daily *If you need a refill on your cardiac medications before your next appointment, please call your pharmacy*  Lab Work: None Ordered If you have labs (blood work) drawn today and your tests are completely normal, you will receive your results only by: MyChart Message (if you have MyChart) OR A paper copy in the mail If you have any lab test that is abnormal or we need to change your treatment, we will call you to review the results.  Testing/Procedures: None Ordered  Follow-Up: At [facility], you and your health needs are our priority.  As part of our continuing mission to provide you with exceptional heart care, our providers are all part of one team.  This team includes your primary Cardiologist (physician) and Advanced Practice Providers or APPs (Physician Assistants and Nurse Practitioners) who all work together to provide you with the care you need, when you need it.  Your next appointment:   6 month(s)  Provider:   Lonni LITTIE Nanas, MD    Other Instructions Notify us  if you have any concerning symptoms with the increased dose of carvedilol  such as lower blood pressure, lightheadedness or dizziness

## 2024-01-10 DIAGNOSIS — M7502 Adhesive capsulitis of left shoulder: Secondary | ICD-10-CM | POA: Diagnosis not present

## 2024-04-02 ENCOUNTER — Encounter (HOSPITAL_COMMUNITY)

## 2024-04-02 ENCOUNTER — Ambulatory Visit

## 2024-05-30 ENCOUNTER — Ambulatory Visit: Admitting: Adult Health
